# Patient Record
Sex: Female | Born: 1955 | Race: Black or African American | Hispanic: No | Marital: Single | Smoking: Never smoker
Health system: Southern US, Community
[De-identification: ages and names within clinical notes are randomized; demographics above are authoritative.]

## PROBLEM LIST (undated history)

## (undated) DIAGNOSIS — F419 Anxiety disorder, unspecified: Secondary | ICD-10-CM

## (undated) DIAGNOSIS — G4733 Obstructive sleep apnea (adult) (pediatric): Secondary | ICD-10-CM

## (undated) DIAGNOSIS — I2510 Atherosclerotic heart disease of native coronary artery without angina pectoris: Secondary | ICD-10-CM

## (undated) DIAGNOSIS — R519 Headache, unspecified: Secondary | ICD-10-CM

## (undated) DIAGNOSIS — F32A Depression, unspecified: Secondary | ICD-10-CM

## (undated) DIAGNOSIS — F331 Major depressive disorder, recurrent, moderate: Secondary | ICD-10-CM

## (undated) DIAGNOSIS — I10 Essential (primary) hypertension: Secondary | ICD-10-CM

## (undated) DIAGNOSIS — R51 Headache: Secondary | ICD-10-CM

## (undated) DIAGNOSIS — F329 Major depressive disorder, single episode, unspecified: Secondary | ICD-10-CM

## (undated) DIAGNOSIS — M1990 Unspecified osteoarthritis, unspecified site: Secondary | ICD-10-CM

## (undated) DIAGNOSIS — K219 Gastro-esophageal reflux disease without esophagitis: Secondary | ICD-10-CM

## (undated) DIAGNOSIS — Z83719 Family history of colon polyps, unspecified: Secondary | ICD-10-CM

## (undated) DIAGNOSIS — Z8371 Family history of colonic polyps: Secondary | ICD-10-CM

## (undated) DIAGNOSIS — J45909 Unspecified asthma, uncomplicated: Secondary | ICD-10-CM

## (undated) DIAGNOSIS — D649 Anemia, unspecified: Secondary | ICD-10-CM

## (undated) HISTORY — DX: Unspecified osteoarthritis, unspecified site: M19.90

## (undated) HISTORY — DX: Headache, unspecified: R51.9

## (undated) HISTORY — DX: Anemia, unspecified: D64.9

## (undated) HISTORY — DX: Headache: R51

## (undated) HISTORY — DX: Major depressive disorder, single episode, unspecified: F32.9

## (undated) HISTORY — DX: Major depressive disorder, recurrent, moderate: F33.1

## (undated) HISTORY — DX: Gastro-esophageal reflux disease without esophagitis: K21.9

## (undated) HISTORY — DX: Obstructive sleep apnea (adult) (pediatric): G47.33

## (undated) HISTORY — PX: TUBAL LIGATION: SHX77

## (undated) HISTORY — DX: Essential (primary) hypertension: I10

## (undated) HISTORY — PX: CARDIAC CATHETERIZATION: SHX172

## (undated) HISTORY — DX: Unspecified asthma, uncomplicated: J45.909

## (undated) HISTORY — DX: Atherosclerotic heart disease of native coronary artery without angina pectoris: I25.10

## (undated) HISTORY — DX: Anxiety disorder, unspecified: F41.9

## (undated) HISTORY — DX: Depression, unspecified: F32.A

---

## 2004-10-21 ENCOUNTER — Ambulatory Visit: Payer: Self-pay

## 2005-07-26 ENCOUNTER — Emergency Department: Payer: Self-pay | Admitting: Internal Medicine

## 2005-11-22 ENCOUNTER — Emergency Department: Payer: Self-pay | Admitting: Emergency Medicine

## 2006-02-02 ENCOUNTER — Ambulatory Visit: Payer: Self-pay | Admitting: Gastroenterology

## 2006-05-05 ENCOUNTER — Ambulatory Visit: Payer: Self-pay

## 2006-09-07 ENCOUNTER — Ambulatory Visit: Payer: Self-pay

## 2006-10-25 ENCOUNTER — Other Ambulatory Visit: Payer: Self-pay

## 2006-10-26 ENCOUNTER — Ambulatory Visit: Payer: Self-pay

## 2007-06-14 ENCOUNTER — Ambulatory Visit: Payer: Self-pay

## 2007-11-22 ENCOUNTER — Ambulatory Visit: Payer: Self-pay | Admitting: Family Medicine

## 2007-12-02 ENCOUNTER — Ambulatory Visit: Payer: Self-pay | Admitting: Family Medicine

## 2008-05-10 ENCOUNTER — Ambulatory Visit: Payer: Self-pay | Admitting: Family Medicine

## 2008-07-30 ENCOUNTER — Ambulatory Visit: Payer: Self-pay

## 2008-08-09 ENCOUNTER — Ambulatory Visit: Payer: Self-pay

## 2008-09-21 ENCOUNTER — Ambulatory Visit: Payer: Self-pay | Admitting: Podiatry

## 2009-01-02 ENCOUNTER — Ambulatory Visit: Payer: Self-pay | Admitting: Internal Medicine

## 2009-06-15 ENCOUNTER — Inpatient Hospital Stay: Payer: Self-pay | Admitting: Internal Medicine

## 2010-01-14 ENCOUNTER — Ambulatory Visit: Payer: Self-pay | Admitting: Internal Medicine

## 2010-10-07 ENCOUNTER — Ambulatory Visit: Payer: Self-pay | Admitting: Emergency Medicine

## 2010-12-30 ENCOUNTER — Ambulatory Visit: Payer: Self-pay | Admitting: Internal Medicine

## 2011-01-28 ENCOUNTER — Ambulatory Visit: Payer: Self-pay | Admitting: Internal Medicine

## 2011-03-03 ENCOUNTER — Ambulatory Visit: Payer: Self-pay | Admitting: Obstetrics and Gynecology

## 2011-03-06 ENCOUNTER — Ambulatory Visit: Payer: Self-pay | Admitting: Obstetrics and Gynecology

## 2011-07-20 DIAGNOSIS — I119 Hypertensive heart disease without heart failure: Secondary | ICD-10-CM | POA: Insufficient documentation

## 2011-07-20 DIAGNOSIS — K219 Gastro-esophageal reflux disease without esophagitis: Secondary | ICD-10-CM | POA: Insufficient documentation

## 2011-07-20 DIAGNOSIS — J45909 Unspecified asthma, uncomplicated: Secondary | ICD-10-CM | POA: Insufficient documentation

## 2011-08-21 ENCOUNTER — Inpatient Hospital Stay: Payer: Self-pay | Admitting: Psychiatry

## 2011-08-25 ENCOUNTER — Inpatient Hospital Stay: Payer: Self-pay | Admitting: Internal Medicine

## 2011-09-03 ENCOUNTER — Emergency Department: Payer: Self-pay | Admitting: Emergency Medicine

## 2011-10-20 ENCOUNTER — Ambulatory Visit: Payer: Self-pay | Admitting: Internal Medicine

## 2011-11-08 ENCOUNTER — Emergency Department: Payer: Self-pay | Admitting: Emergency Medicine

## 2012-03-09 ENCOUNTER — Ambulatory Visit: Payer: Self-pay | Admitting: Internal Medicine

## 2012-09-06 ENCOUNTER — Emergency Department: Payer: Self-pay | Admitting: Emergency Medicine

## 2012-09-06 LAB — URINALYSIS, COMPLETE
Bilirubin,UR: NEGATIVE
Blood: NEGATIVE
Glucose,UR: NEGATIVE mg/dL (ref 0–75)
Ketone: NEGATIVE
Nitrite: NEGATIVE
RBC,UR: 3 /HPF (ref 0–5)
Specific Gravity: 1.016 (ref 1.003–1.030)
Squamous Epithelial: 5

## 2012-09-06 LAB — COMPREHENSIVE METABOLIC PANEL
Albumin: 4 g/dL (ref 3.4–5.0)
Alkaline Phosphatase: 54 U/L (ref 50–136)
BUN: 8 mg/dL (ref 7–18)
Chloride: 107 mmol/L (ref 98–107)
Co2: 27 mmol/L (ref 21–32)
Creatinine: 1.05 mg/dL (ref 0.60–1.30)
EGFR (African American): >60
EGFR (Non-African Amer.): 59 — ABNORMAL LOW
Glucose: 112 mg/dL — ABNORMAL HIGH (ref 65–99)
Osmolality: 284 (ref 275–301)
SGOT(AST): 16 U/L (ref 15–37)
SGPT (ALT): 16 U/L (ref 12–78)
Sodium: 143 mmol/L (ref 136–145)

## 2012-09-06 LAB — CBC
HGB: 13.1 g/dL (ref 12.0–16.0)
MCV: 70 fL — ABNORMAL LOW (ref 80–100)
Platelet: 255 10*3/uL (ref 150–440)
RBC: 5.65 10*6/uL — ABNORMAL HIGH (ref 3.80–5.20)
WBC: 4.5 10*3/uL (ref 3.6–11.0)

## 2012-09-06 LAB — CK TOTAL AND CKMB (NOT AT ARMC): CK, Total: 78 U/L (ref 21–215)

## 2012-09-06 LAB — TROPONIN I: Troponin-I: <0.02 ng/mL

## 2013-01-28 IMAGING — CR DG CHEST 2V
1 series · 2 of 2 positions shown · non-contrast
Comparison: none

REASON FOR EXAM: chest pain
COMMENTS:

[Series 1: w chest pa · 0.14mm/px · 2 of 2 slices shown]
[im 1/2]
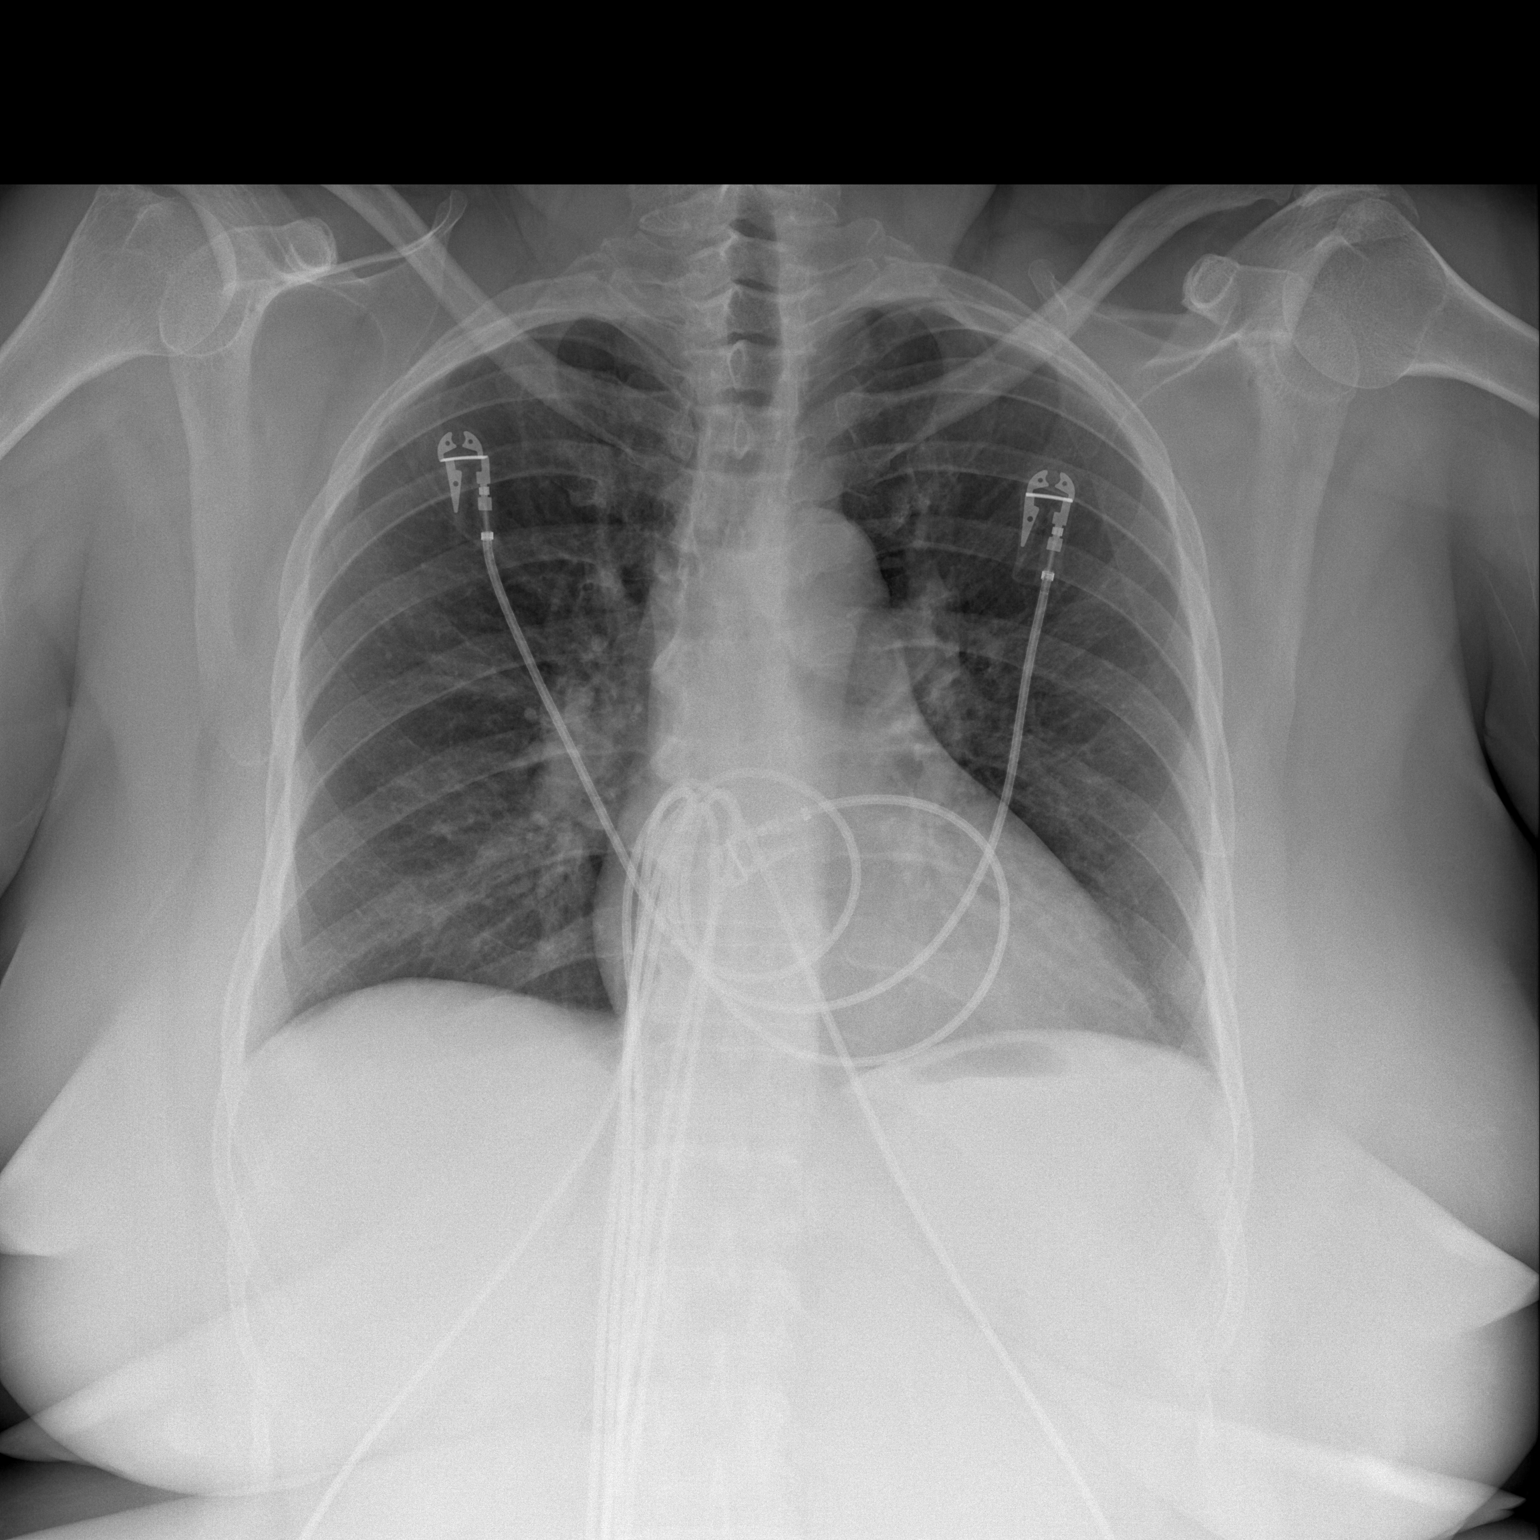
[im 2/2]
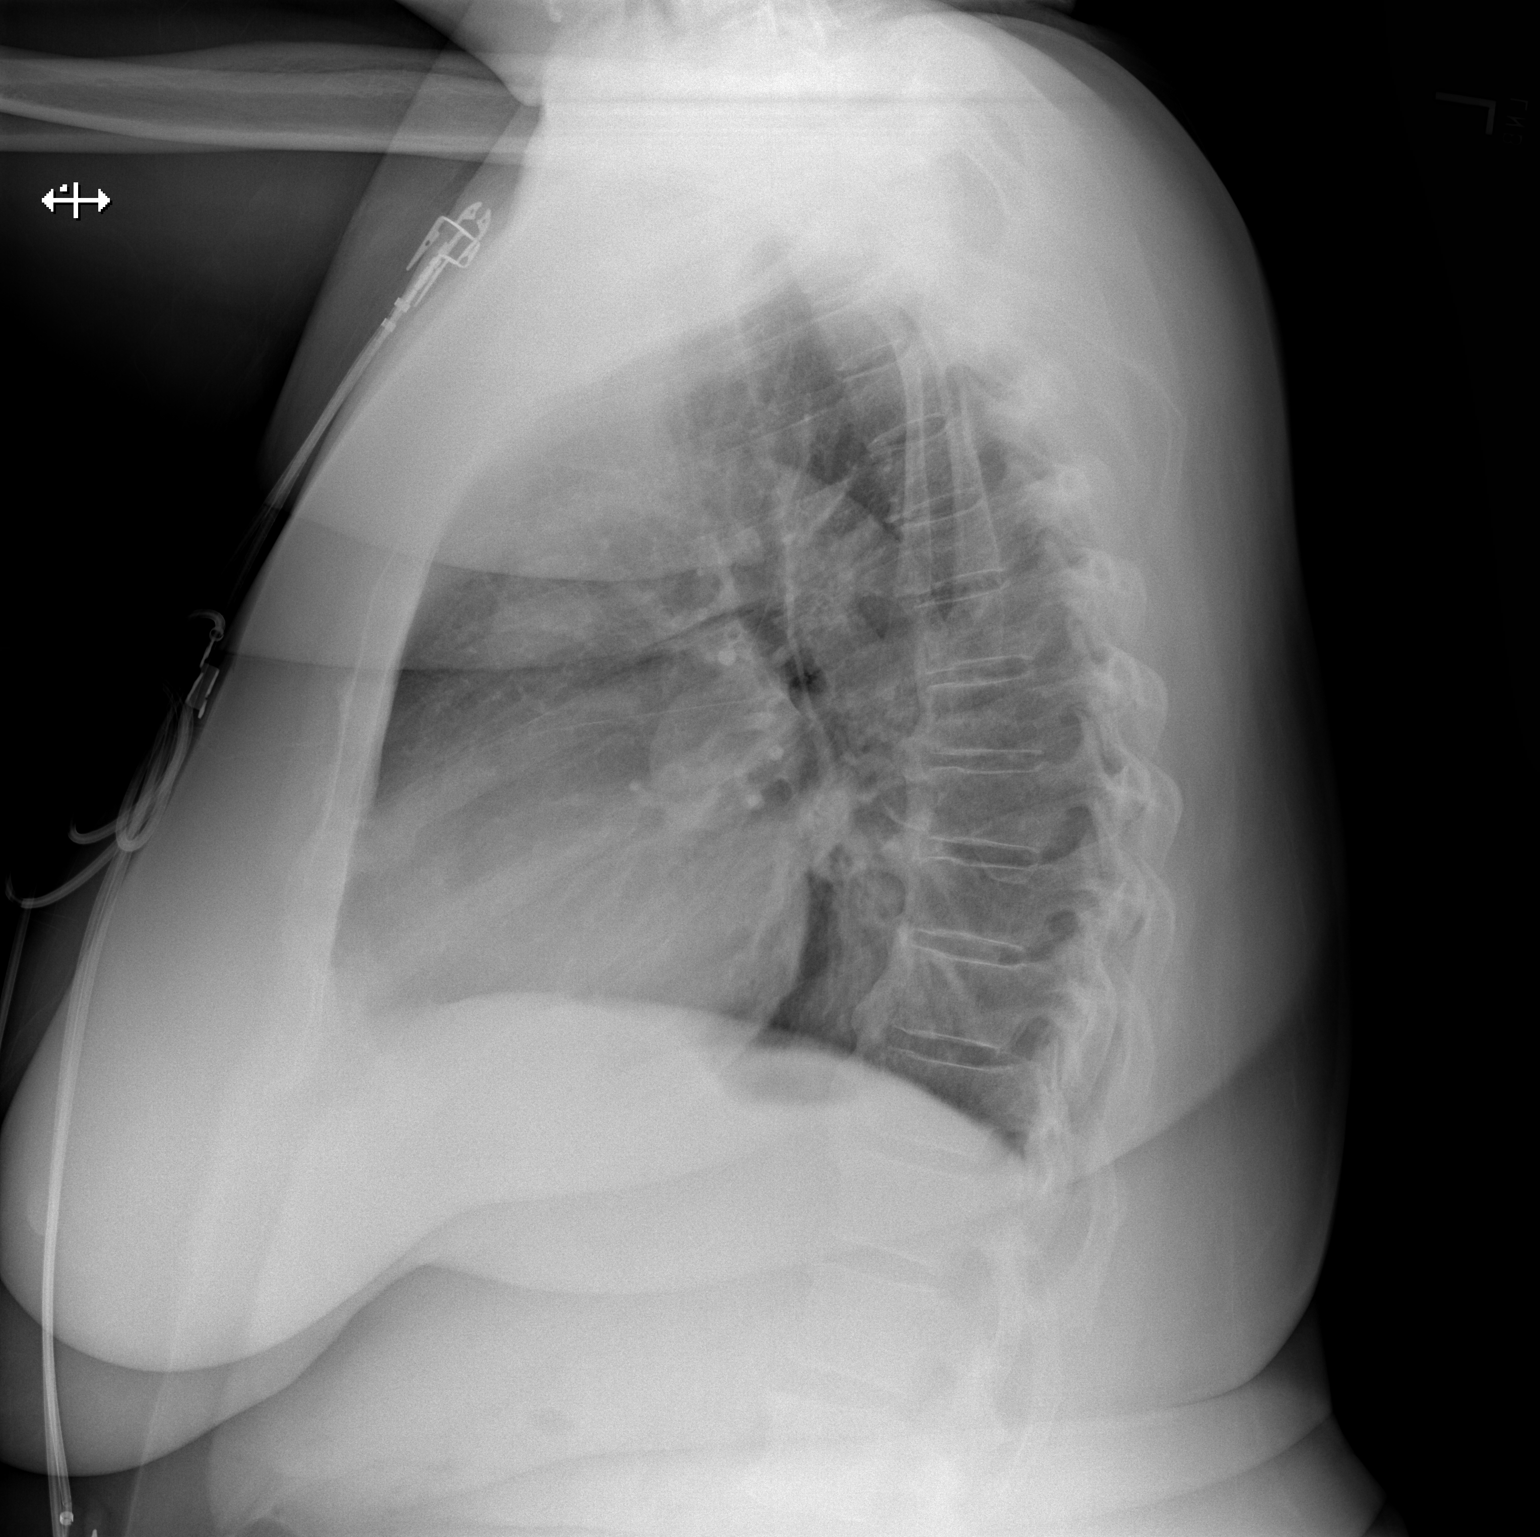

[2 of 2 positions shown; findings below may reference images not displayed]

PROCEDURE:     DXR - DXR CHEST PA (OR AP) AND LATERAL  - August 26, 2011  [DATE]

RESULT:     Comparison is made to a prior exam of 06/15/2009. The lung fields
are clear. No pneumonia, pneumothorax or pleural effusion is seen. Heart is
upper limits for normal in size and is stable in appearance as compared to
the prior exam 06/15/2009. Monitoring electrodes are present.
IMPRESSION: No acute changes are identified.

## 2013-03-10 ENCOUNTER — Ambulatory Visit: Payer: Self-pay | Admitting: Internal Medicine

## 2013-05-09 ENCOUNTER — Emergency Department: Payer: Self-pay | Admitting: Emergency Medicine

## 2013-05-09 LAB — COMPREHENSIVE METABOLIC PANEL
Albumin: 3.8 g/dL (ref 3.4–5.0)
Alkaline Phosphatase: 72 U/L (ref 50–136)
Anion Gap: 6 — ABNORMAL LOW (ref 7–16)
BUN: 12 mg/dL (ref 7–18)
Calcium, Total: 8.9 mg/dL (ref 8.5–10.1)
Co2: 28 mmol/L (ref 21–32)
EGFR (African American): >60
EGFR (Non-African Amer.): >60
Glucose: 83 mg/dL (ref 65–99)
Osmolality: 280 (ref 275–301)
SGOT(AST): 17 U/L (ref 15–37)
SGPT (ALT): 20 U/L (ref 12–78)
Sodium: 141 mmol/L (ref 136–145)
Total Protein: 6.9 g/dL (ref 6.4–8.2)

## 2013-05-09 LAB — URINALYSIS, COMPLETE
Bilirubin,UR: NEGATIVE
Blood: NEGATIVE
Ph: 5 (ref 4.5–8.0)
Protein: NEGATIVE
RBC,UR: 1 /HPF (ref 0–5)
Specific Gravity: 1.017 (ref 1.003–1.030)
Squamous Epithelial: 1
WBC UR: 4 /HPF (ref 0–5)

## 2013-05-09 LAB — CBC
HGB: 11.9 g/dL — ABNORMAL LOW (ref 12.0–16.0)
MCH: 22.5 pg — ABNORMAL LOW (ref 26.0–34.0)
MCV: 69 fL — ABNORMAL LOW (ref 80–100)
Platelet: 196 10*3/uL (ref 150–440)

## 2013-05-10 LAB — LIPASE, BLOOD: Lipase: 125 U/L (ref 73–393)

## 2013-05-10 LAB — TROPONIN I: Troponin-I: <0.02 ng/mL

## 2013-05-10 LAB — CK TOTAL AND CKMB (NOT AT ARMC): CK, Total: 113 U/L (ref 21–215)

## 2013-05-12 LAB — URINE CULTURE

## 2013-06-26 DIAGNOSIS — M1990 Unspecified osteoarthritis, unspecified site: Secondary | ICD-10-CM | POA: Insufficient documentation

## 2013-06-26 DIAGNOSIS — R0789 Other chest pain: Secondary | ICD-10-CM | POA: Insufficient documentation

## 2013-06-26 DIAGNOSIS — IMO0001 Reserved for inherently not codable concepts without codable children: Secondary | ICD-10-CM | POA: Insufficient documentation

## 2013-08-04 ENCOUNTER — Ambulatory Visit: Payer: 59 | Admitting: Neurology

## 2013-08-15 ENCOUNTER — Telehealth: Payer: Self-pay | Admitting: Diagnostic Neuroimaging

## 2013-08-15 DIAGNOSIS — R0683 Snoring: Secondary | ICD-10-CM

## 2013-08-15 DIAGNOSIS — G4710 Hypersomnia, unspecified: Secondary | ICD-10-CM

## 2013-08-16 NOTE — Telephone Encounter (Signed)
Left message for patient to let me know when she will be in to pick up medicine, since it needs to be refrigerated.

## 2013-08-22 ENCOUNTER — Encounter: Payer: Self-pay | Admitting: Neurology

## 2013-08-23 ENCOUNTER — Encounter: Payer: Self-pay | Admitting: Neurology

## 2013-08-23 ENCOUNTER — Ambulatory Visit (INDEPENDENT_AMBULATORY_CARE_PROVIDER_SITE_OTHER): Payer: Medicare Other | Admitting: Neurology

## 2013-08-23 ENCOUNTER — Encounter (INDEPENDENT_AMBULATORY_CARE_PROVIDER_SITE_OTHER): Payer: Self-pay

## 2013-08-23 VITALS — BP 132/72 | HR 54 | Ht 65.0 in | Wt 266.0 lb

## 2013-08-23 DIAGNOSIS — R5381 Other malaise: Secondary | ICD-10-CM

## 2013-08-23 DIAGNOSIS — G4700 Insomnia, unspecified: Secondary | ICD-10-CM

## 2013-08-23 DIAGNOSIS — R51 Headache: Secondary | ICD-10-CM

## 2013-08-23 DIAGNOSIS — R5383 Other fatigue: Secondary | ICD-10-CM

## 2013-08-23 NOTE — Progress Notes (Signed)
[facility]    Provider:  Dr Hosie Poisson Referring Provider: Hyman Hopes, MD Primary Care Physician:  Hyman Hopes, MD  CC:  headache  HPI:  Carly Marks is a 57 y.o. female here as a referral from Dr. Lawerance Bach for headache evaluation  Has been having headaches for years, occurring almost daily, notes a last all day. Described as a bifrontal dull aching pain can get up to an 8 or 9/10. No nausea or vomiting, does have some mild photophobia. No focal weakness with the headache no visual changes. No dizziness or vertigo. Takes acetaminophen 500 mg daily for the headache, does not take it everyday though. Does not think she has been on any other medications for the headache.  She does note sleep troubles, difficulty staying asleep, waking up frequently. She has been told she snores, unsure she has apnea events. Wakes up feeling tired, no energy throughout the day.  Review of Systems: Out of a complete 14 system review, the patient complains of only the following symptoms, and all other reviewed systems are negative. Positive for fevers chills blurred vision shortness of breath easy bruising increased thirst snoring constipation joint pain aching muscles urinary problems none of sleep decreased energy dizziness headache insomnia sleepiness  History   Social History  . Marital Status: Single    Spouse Name: N/A    Number of Children: 3  . Years of Education: 10 TH   Occupational History  . Not on file.   Social History Main Topics  . Smoking status: Never Smoker   . Smokeless tobacco: Never Used  . Alcohol Use: No  . Drug Use: No  . Sexual Activity: Not on file   Other Topics Concern  . Not on file   Social History Narrative  . No narrative on file    Family History  Problem Relation Age of Onset  . Cervical cancer Mother   . Colon cancer    . Hypertension    . Diabetes Maternal Aunt   . Diabetes Maternal Uncle     Past Medical History    Diagnosis Date  . GERD (gastroesophageal reflux disease)   . Depression   . Anxiety   . Obstructive sleep apnea   . HTN (hypertension)   . Anemia   . HA (headache)   . Arthritis     No past surgical history on file.  Current Outpatient Prescriptions  Medication Sig Dispense Refill  . albuterol (PROVENTIL HFA;VENTOLIN HFA) 108 (90 BASE) MCG/ACT inhaler Inhale 2 puffs into the lungs every 6 (six) hours as needed for wheezing.      Marland Kitchen amLODipine-benazepril (LOTREL) 5-10 MG per capsule Take 5-10 capsules by mouth daily.      Marland Kitchen aspirin 81 MG tablet Take 81 mg by mouth daily.      . cholecalciferol (VITAMIN D) 400 UNITS TABS tablet Take by mouth.      . clonazePAM (KLONOPIN) 0.5 MG tablet Take 0.5 mg by mouth daily.      . famotidine (PEPCID) 20 MG tablet Take 20 mg by mouth daily.      Marland Kitchen FLUoxetine (PROZAC) 20 MG capsule Take 20 mg by mouth daily.      . fluticasone (FLONASE) 50 MCG/ACT nasal spray Place 2 sprays into the nose daily.      . meloxicam (MOBIC) 15 MG tablet Take 15 mg by mouth daily.      Marland Kitchen omeprazole (PRILOSEC) 20 MG capsule Take 20 mg by mouth daily.      Marland Kitchen  oxybutynin (DITROPAN) 5 MG tablet Take 5 mg by mouth daily.      Marland Kitchen PATANOL 0.1 % ophthalmic solution Place 0.1 drops into both eyes daily.      . ranitidine (ZANTAC) 75 MG tablet Take 75 mg by mouth 2 (two) times daily.      . traZODone (DESYREL) 150 MG tablet Take 150 mg by mouth daily.      Marland Kitchen zoster vaccine live, PF, (ZOSTAVAX) 40981 UNT/0.65ML injection Inject 0.65 mLs into the skin once.       No current facility-administered medications for this visit.    Allergies as of 08/23/2013 - Review Complete 08/23/2013  Allergen Reaction Noted  . Codeine  08/22/2013  . Dextroamphetamine  08/22/2013  . Nsaids  08/22/2013    Vitals: BP 132/72  Pulse 54  Ht 5\' 5"  (1.651 m)  Wt 266 lb (120.657 kg)  BMI 44.26 kg/m2 Last Weight:  Wt Readings from Last 1 Encounters:  08/23/13 266 lb (120.657 kg)   Last Height:    Ht Readings from Last 1 Encounters:  08/23/13 5\' 5"  (1.651 m)     Physical exam: Exam: Gen: NAD, conversant Eyes: anicteric sclerae, moist conjunctivae HENT: Atraumatic, oropharynx clear Neck: Trachea midline; supple,  Lungs: CTA, no wheezing, rales, rhonic                          CV: RRR, no MRG Abdomen: Soft, non-tender;  Extremities: No peripheral edema  Skin: Normal temperature, no rash,  Psych: Appropriate affect, pleasant  Neuro: MS: AA&Ox3, appropriately interactive, normal affect   Speech: Limited but fluent w/o paraphasic error  Memory: good recent and remote recall  CN: PERRL, EOMI no nystagmus, no ptosis, sensation intact to LT V1-V3 bilat, face symmetric, no weakness, hearing grossly intact, palate elevates symmetrically, shoulder shrug 5/5 bilat,  tongue protrudes midline, no fasiculations noted.  Motor: normal bulk and tone Strength: 5/5  In all extremities  Coord: rapid alternating and point-to-point (FNF, HTS) movements intact.  Reflexes: symmetrical, bilat downgoing toes  Sens: LT intact in all extremities  Gait: posture, stance, stride and arm-swing normal. Tandem gait intact. Able to walk on heels and toes. Romberg absent.   Assessment:  After physical and neurologic examination, review of laboratory studies, imaging, neurophysiology testing and pre-existing records, assessment will be reviewed on the problem list.  Plan:  Treatment plan and additional workup will be reviewed under Problem List.  1)Headache 2)Insomnia/fatigue  Ms. Barrett is a pleasant 57 year old woman presents for initial evaluation of headache. She reports has been ongoing for years, described as bifrontal dull  aching pain. Physical exam is unremarkable. Headache appears to be tension type in nature, cannot rule out obstructive sleep apnea as cause. Discussed different treatment options with patient, she wishes to hold off on oral medication at this time pending workup for  sleep apnea. Will refer patient to sleep Center for further workup.

## 2013-08-23 NOTE — Patient Instructions (Addendum)
Overall you are doing fairly well but I do want to suggest a few things today:   Remember to drink plenty of fluid, eat healthy meals and do not skip any meals. Try to eat protein with a every meal and eat a healthy snack such as fruit or nuts in between meals. Try to keep a regular sleep-wake schedule and try to exercise daily, particularly in the form of walking, 20-30 minutes a day, if you can.   We will place a referral for you to see our sleep specialist to determine if you are suffering from sleep apnea. This can be the cause of your headaches.   In the future we can consider a medication called Gabapentin.   I would like to see you back once you have been evaluated by the sleep clinic, sooner if we need to. Please call us with any interim questions, concerns, problems, updates or refill requests.   My clinical assistant and will answer any of your questions and relay your messages to me and also relay most of my messages to you.   Our phone number is [phone]. We also have an after hours call service for urgent matters and there is a physician on-call for urgent questions. For any emergencies you know to call 911 or go to the nearest emergency room

## 2013-08-31 ENCOUNTER — Telehealth: Payer: Self-pay | Admitting: Neurology

## 2013-08-31 NOTE — Telephone Encounter (Signed)
Patient referred by dr Hosie Poisson for sleep study, should be undergoing SPLIT at [facility] 20 and score 45 , [employer], medic.  CO2 needed.

## 2013-08-31 NOTE — Telephone Encounter (Signed)
. °  Dr. Elspeth Cho is referring Carly Marks, 57 y.o. female, for the evaluation of sleep apnea.  Wt: 266 lbs. Ht: 65 in. BMI: 44.26  Diagnoses: Morbid Obesity Snoring Excessive Daytime Sleepiness Insomnia Fatigue Headache HTN Frequent Awakenings  Medication List: Current Outpatient Prescriptions  Medication Sig Dispense Refill   albuterol (PROVENTIL HFA;VENTOLIN HFA) 108 (90 BASE) MCG/ACT inhaler Inhale 2 puffs into the lungs every 6 (six) hours as needed for wheezing.       amLODipine-benazepril (LOTREL) 5-10 MG per capsule Take 5-10 capsules by mouth daily.       aspirin 81 MG tablet Take 81 mg by mouth daily.       cholecalciferol (VITAMIN D) 400 UNITS TABS tablet Take by mouth.       clonazePAM (KLONOPIN) 0.5 MG tablet Take 0.5 mg by mouth daily.       famotidine (PEPCID) 20 MG tablet Take 20 mg by mouth daily.       FLUoxetine (PROZAC) 20 MG capsule Take 20 mg by mouth daily.       fluticasone (FLONASE) 50 MCG/ACT nasal spray Place 2 sprays into the nose daily.       meloxicam (MOBIC) 15 MG tablet Take 15 mg by mouth daily.       omeprazole (PRILOSEC) 20 MG capsule Take 20 mg by mouth daily.       oxybutynin (DITROPAN) 5 MG tablet Take 5 mg by mouth daily.       PATANOL 0.1 % ophthalmic solution Place 0.1 drops into both eyes daily.       ranitidine (ZANTAC) 75 MG tablet Take 75 mg by mouth 2 (two) times daily.       traZODone (DESYREL) 150 MG tablet Take 150 mg by mouth daily.       zoster vaccine live, PF, (ZOSTAVAX) 16109 UNT/0.65ML injection Inject 0.65 mLs into the skin once.       No current facility-administered medications for this visit.    This patient presents to Dr. Elspeth Cho for a headache evaluation.  Pt is morbidly obese and has ongoing hx of daily headache for years.  She does note some sleep troubles, has difficulty staying asleep and awakens frequently.  She has been told that she snores but she is unaware of apneic events.  She is  fatigued upon awakening and has no energy throughout the day.  Dr. Hosie Poisson is requesting an attended sleep study to rule out osa.  Insurance:  [facility] MEDICARE/MEDICAID - Prior approval is not required

## 2013-09-18 NOTE — Telephone Encounter (Signed)
Patient came in to see Dr. Hosie Poisson on 08-23-13.  I don't see that the patient is on Copaxone, I will return sample to stock.

## 2014-01-18 LAB — COMPREHENSIVE METABOLIC PANEL
ALBUMIN: 4 g/dL (ref 3.4–5.0)
ANION GAP: 4 — AB (ref 7–16)
Alkaline Phosphatase: 61 U/L
BUN: 11 mg/dL (ref 7–18)
Bilirubin,Total: 1.1 mg/dL — ABNORMAL HIGH (ref 0.2–1.0)
CALCIUM: 9.1 mg/dL (ref 8.5–10.1)
CO2: 27 mmol/L (ref 21–32)
CREATININE: 0.97 mg/dL (ref 0.60–1.30)
Chloride: 107 mmol/L (ref 98–107)
EGFR (Non-African Amer.): >60
Glucose: 96 mg/dL (ref 65–99)
OSMOLALITY: 275 (ref 275–301)
Potassium: 4 mmol/L (ref 3.5–5.1)
SGOT(AST): 15 U/L (ref 15–37)
SGPT (ALT): 18 U/L (ref 12–78)
SODIUM: 138 mmol/L (ref 136–145)
TOTAL PROTEIN: 7.4 g/dL (ref 6.4–8.2)

## 2014-01-18 LAB — DRUG SCREEN, URINE
AMPHETAMINES, UR SCREEN: NEGATIVE (ref ?–1000)
BENZODIAZEPINE, UR SCRN: NEGATIVE (ref ?–200)
Barbiturates, Ur Screen: NEGATIVE (ref ?–200)
COCAINE METABOLITE, UR ~~LOC~~: NEGATIVE (ref ?–300)
Cannabinoid 50 Ng, Ur ~~LOC~~: NEGATIVE (ref ?–50)
MDMA (Ecstasy)Ur Screen: NEGATIVE (ref ?–500)
METHADONE, UR SCREEN: NEGATIVE (ref ?–300)
Opiate, Ur Screen: NEGATIVE (ref ?–300)
PHENCYCLIDINE (PCP) UR S: NEGATIVE (ref ?–25)
TRICYCLIC, UR SCREEN: NEGATIVE (ref ?–1000)

## 2014-01-18 LAB — CBC
HCT: 37.8 % (ref 35.0–47.0)
HGB: 12.2 g/dL (ref 12.0–16.0)
MCH: 22.7 pg — ABNORMAL LOW (ref 26.0–34.0)
MCHC: 32.3 g/dL (ref 32.0–36.0)
MCV: 70 fL — AB (ref 80–100)
Platelet: 243 10*3/uL (ref 150–440)
RBC: 5.39 10*6/uL — AB (ref 3.80–5.20)
RDW: 14.8 % — AB (ref 11.5–14.5)
WBC: 4.7 10*3/uL (ref 3.6–11.0)

## 2014-01-18 LAB — URINALYSIS, COMPLETE
Bilirubin,UR: NEGATIVE
Glucose,UR: NEGATIVE mg/dL (ref 0–75)
Ketone: NEGATIVE
NITRITE: NEGATIVE
Ph: 7 (ref 4.5–8.0)
Protein: NEGATIVE
RBC,UR: 4 /HPF (ref 0–5)
Specific Gravity: 1.013 (ref 1.003–1.030)
WBC UR: 3 /HPF (ref 0–5)

## 2014-01-18 LAB — SALICYLATE LEVEL

## 2014-01-18 LAB — ACETAMINOPHEN LEVEL

## 2014-01-18 LAB — ETHANOL
Ethanol %: <0.003 % (ref 0.000–0.080)
Ethanol: <3 mg/dL

## 2014-01-19 ENCOUNTER — Inpatient Hospital Stay: Payer: Self-pay | Admitting: Psychiatry

## 2014-01-30 LAB — BASIC METABOLIC PANEL
ANION GAP: 1 — AB (ref 7–16)
BUN: 31 mg/dL — AB (ref 7–18)
CO2: 29 mmol/L (ref 21–32)
Calcium, Total: 9 mg/dL (ref 8.5–10.1)
Chloride: 102 mmol/L (ref 98–107)
Creatinine: 2.26 mg/dL — ABNORMAL HIGH (ref 0.60–1.30)
EGFR (African American): 27 — ABNORMAL LOW
EGFR (Non-African Amer.): 23 — ABNORMAL LOW
Glucose: 92 mg/dL (ref 65–99)
Osmolality: 271 (ref 275–301)
POTASSIUM: 4.8 mmol/L (ref 3.5–5.1)
SODIUM: 132 mmol/L — AB (ref 136–145)

## 2014-01-30 LAB — LITHIUM LEVEL
LITHIUM: 2.16 mmol/L — AB
LITHIUM: 2.04 mmol/L — AB

## 2014-01-31 ENCOUNTER — Inpatient Hospital Stay: Payer: Self-pay | Admitting: Internal Medicine

## 2014-01-31 LAB — CBC WITH DIFFERENTIAL/PLATELET
Basophil #: 0 10*3/uL (ref 0.0–0.1)
Basophil %: 0.6 %
Eosinophil #: 0.2 10*3/uL (ref 0.0–0.7)
Eosinophil %: 3.4 %
HCT: 37.6 % (ref 35.0–47.0)
HGB: 11.9 g/dL — ABNORMAL LOW (ref 12.0–16.0)
LYMPHS ABS: 1.2 10*3/uL (ref 1.0–3.6)
LYMPHS PCT: 17.6 %
MCH: 22.6 pg — ABNORMAL LOW (ref 26.0–34.0)
MCHC: 31.7 g/dL — AB (ref 32.0–36.0)
MCV: 71 fL — AB (ref 80–100)
MONOS PCT: 7.2 %
Monocyte #: 0.5 x10 3/mm (ref 0.2–0.9)
NEUTROS ABS: 4.7 10*3/uL (ref 1.4–6.5)
NEUTROS PCT: 71.2 %
Platelet: 160 10*3/uL (ref 150–440)
RBC: 5.28 10*6/uL — ABNORMAL HIGH (ref 3.80–5.20)
RDW: 14.8 % — AB (ref 11.5–14.5)
WBC: 6.6 10*3/uL (ref 3.6–11.0)

## 2014-01-31 LAB — URINALYSIS, COMPLETE
BACTERIA: NONE SEEN
BILIRUBIN, UR: NEGATIVE
BLOOD: NEGATIVE
Glucose,UR: NEGATIVE mg/dL (ref 0–75)
KETONE: NEGATIVE
Leukocyte Esterase: NEGATIVE
NITRITE: NEGATIVE
Ph: 6 (ref 4.5–8.0)
Protein: NEGATIVE
RBC,UR: NONE SEEN /HPF (ref 0–5)
Specific Gravity: 1.004 (ref 1.003–1.030)
WBC UR: <1 /HPF (ref 0–5)

## 2014-01-31 LAB — BASIC METABOLIC PANEL
ANION GAP: 5 — AB (ref 7–16)
Anion Gap: 4 — ABNORMAL LOW (ref 7–16)
BUN: 29 mg/dL — ABNORMAL HIGH (ref 7–18)
BUN: 26 mg/dL — ABNORMAL HIGH (ref 7–18)
CALCIUM: 8.9 mg/dL (ref 8.5–10.1)
CALCIUM: 9.1 mg/dL (ref 8.5–10.1)
CHLORIDE: 104 mmol/L (ref 98–107)
CO2: 25 mmol/L (ref 21–32)
CREATININE: 1.74 mg/dL — AB (ref 0.60–1.30)
Chloride: 99 mmol/L (ref 98–107)
Co2: 27 mmol/L (ref 21–32)
Creatinine: 2.04 mg/dL — ABNORMAL HIGH (ref 0.60–1.30)
EGFR (African American): 31 — ABNORMAL LOW
EGFR (Non-African Amer.): 26 — ABNORMAL LOW
GFR CALC AF AMER: 37 — AB
GFR CALC NON AF AMER: 32 — AB
GLUCOSE: 82 mg/dL (ref 65–99)
Glucose: 85 mg/dL (ref 65–99)
Osmolality: 268 (ref 275–301)
Osmolality: 270 (ref 275–301)
Potassium: 4.2 mmol/L (ref 3.5–5.1)
Potassium: 4.7 mmol/L (ref 3.5–5.1)
Sodium: 131 mmol/L — ABNORMAL LOW (ref 136–145)
Sodium: 133 mmol/L — ABNORMAL LOW (ref 136–145)

## 2014-01-31 LAB — IRON AND TIBC
IRON BIND. CAP.(TOTAL): 228 ug/dL — AB (ref 250–450)
Iron Saturation: 27 %
Iron: 61 ug/dL (ref 50–170)
Unbound Iron-Bind.Cap.: 167 ug/dL

## 2014-01-31 LAB — TSH: THYROID STIMULATING HORM: 3.46 u[IU]/mL

## 2014-01-31 LAB — LITHIUM LEVEL: Lithium: 1.89 mmol/L

## 2014-02-01 LAB — BASIC METABOLIC PANEL
Anion Gap: 4 — ABNORMAL LOW (ref 7–16)
BUN: 17 mg/dL (ref 7–18)
CREATININE: 1.45 mg/dL — AB (ref 0.60–1.30)
Calcium, Total: 8.4 mg/dL — ABNORMAL LOW (ref 8.5–10.1)
Chloride: 108 mmol/L — ABNORMAL HIGH (ref 98–107)
Co2: 24 mmol/L (ref 21–32)
EGFR (African American): 46 — ABNORMAL LOW
EGFR (Non-African Amer.): 40 — ABNORMAL LOW
GLUCOSE: 99 mg/dL (ref 65–99)
Osmolality: 274 (ref 275–301)
Potassium: 4.4 mmol/L (ref 3.5–5.1)
SODIUM: 136 mmol/L (ref 136–145)

## 2014-02-01 LAB — CBC WITH DIFFERENTIAL/PLATELET
Basophil #: 0.1 10*3/uL (ref 0.0–0.1)
Basophil %: 1.3 %
Eosinophil #: 0.4 10*3/uL (ref 0.0–0.7)
Eosinophil %: 5.8 %
HCT: 34.5 % — ABNORMAL LOW (ref 35.0–47.0)
HGB: 11 g/dL — ABNORMAL LOW (ref 12.0–16.0)
LYMPHS ABS: 1.5 10*3/uL (ref 1.0–3.6)
LYMPHS PCT: 22.8 %
MCH: 22.7 pg — AB (ref 26.0–34.0)
MCHC: 31.8 g/dL — ABNORMAL LOW (ref 32.0–36.0)
MCV: 71 fL — AB (ref 80–100)
MONO ABS: 0.7 x10 3/mm (ref 0.2–0.9)
MONOS PCT: 10.3 %
Neutrophil #: 3.9 10*3/uL (ref 1.4–6.5)
Neutrophil %: 59.8 %
PLATELETS: 176 10*3/uL (ref 150–440)
RBC: 4.83 10*6/uL (ref 3.80–5.20)
RDW: 14.6 % — AB (ref 11.5–14.5)
WBC: 6.6 10*3/uL (ref 3.6–11.0)

## 2014-02-01 LAB — LITHIUM LEVEL: Lithium: 1.55 mmol/L

## 2014-02-02 ENCOUNTER — Inpatient Hospital Stay: Payer: Self-pay | Admitting: Psychiatry

## 2014-02-02 LAB — BASIC METABOLIC PANEL
Anion Gap: 5 — ABNORMAL LOW (ref 7–16)
BUN: 10 mg/dL (ref 7–18)
CHLORIDE: 107 mmol/L (ref 98–107)
Calcium, Total: 8.8 mg/dL (ref 8.5–10.1)
Co2: 25 mmol/L (ref 21–32)
Creatinine: 1.28 mg/dL (ref 0.60–1.30)
EGFR (African American): 53 — ABNORMAL LOW
EGFR (Non-African Amer.): 46 — ABNORMAL LOW
GLUCOSE: 94 mg/dL (ref 65–99)
OSMOLALITY: 273 (ref 275–301)
POTASSIUM: 3.9 mmol/L (ref 3.5–5.1)
Sodium: 137 mmol/L (ref 136–145)

## 2014-02-02 LAB — LITHIUM LEVEL: Lithium: 1.16 mmol/L

## 2014-02-03 LAB — URINALYSIS, COMPLETE
BILIRUBIN, UR: NEGATIVE
Bacteria: NONE SEEN
Glucose,UR: NEGATIVE mg/dL (ref 0–75)
Ketone: NEGATIVE
NITRITE: NEGATIVE
Ph: 6 (ref 4.5–8.0)
Protein: NEGATIVE
RBC,UR: 13 /HPF (ref 0–5)
Specific Gravity: 1.006 (ref 1.003–1.030)

## 2014-02-03 LAB — BASIC METABOLIC PANEL
Anion Gap: 3 — ABNORMAL LOW (ref 7–16)
BUN: 8 mg/dL (ref 7–18)
CO2: 27 mmol/L (ref 21–32)
Calcium, Total: 8.9 mg/dL (ref 8.5–10.1)
Chloride: 106 mmol/L (ref 98–107)
Creatinine: 1.2 mg/dL (ref 0.60–1.30)
EGFR (African American): 58 — ABNORMAL LOW
GFR CALC NON AF AMER: 50 — AB
GLUCOSE: 86 mg/dL (ref 65–99)
Osmolality: 270 (ref 275–301)
POTASSIUM: 4.1 mmol/L (ref 3.5–5.1)
SODIUM: 136 mmol/L (ref 136–145)

## 2014-02-03 LAB — LITHIUM LEVEL: LITHIUM: 0.79 mmol/L

## 2014-02-05 LAB — BASIC METABOLIC PANEL
Anion Gap: 3 — ABNORMAL LOW (ref 7–16)
BUN: 9 mg/dL (ref 7–18)
CO2: 27 mmol/L (ref 21–32)
Calcium, Total: 9 mg/dL (ref 8.5–10.1)
Chloride: 107 mmol/L (ref 98–107)
Creatinine: 1.26 mg/dL (ref 0.60–1.30)
EGFR (Non-African Amer.): 47 — ABNORMAL LOW
GFR CALC AF AMER: 54 — AB
Glucose: 104 mg/dL — ABNORMAL HIGH (ref 65–99)
Osmolality: 273 (ref 275–301)
POTASSIUM: 3.9 mmol/L (ref 3.5–5.1)
SODIUM: 137 mmol/L (ref 136–145)

## 2014-02-05 LAB — LITHIUM LEVEL: Lithium: 0.61 mmol/L

## 2014-02-06 LAB — LITHIUM LEVEL: LITHIUM: 0.55 mmol/L — AB

## 2014-02-06 LAB — CREATININE, SERUM
CREATININE: 1.24 mg/dL (ref 0.60–1.30)
EGFR (Non-African Amer.): 48 — ABNORMAL LOW
GFR CALC AF AMER: 55 — AB

## 2014-02-07 LAB — CREATININE, SERUM
Creatinine: 1.27 mg/dL (ref 0.60–1.30)
EGFR (African American): 54 — ABNORMAL LOW
EGFR (Non-African Amer.): 46 — ABNORMAL LOW

## 2014-02-07 LAB — LITHIUM LEVEL: Lithium: 0.41 mmol/L — ABNORMAL LOW

## 2014-03-12 ENCOUNTER — Ambulatory Visit: Payer: Self-pay | Admitting: Internal Medicine

## 2014-10-24 ENCOUNTER — Emergency Department: Payer: Self-pay | Admitting: Emergency Medicine

## 2014-10-24 LAB — COMPREHENSIVE METABOLIC PANEL
ALT: 18 U/L
ANION GAP: 4 — AB (ref 7–16)
Albumin: 3.8 g/dL (ref 3.4–5.0)
Alkaline Phosphatase: 60 U/L
BILIRUBIN TOTAL: 1.3 mg/dL — AB (ref 0.2–1.0)
BUN: 9 mg/dL (ref 7–18)
CALCIUM: 9.2 mg/dL (ref 8.5–10.1)
Chloride: 105 mmol/L (ref 98–107)
Co2: 30 mmol/L (ref 21–32)
Creatinine: 1.24 mg/dL (ref 0.60–1.30)
EGFR (African American): 57 — ABNORMAL LOW
EGFR (Non-African Amer.): 47 — ABNORMAL LOW
GLUCOSE: 86 mg/dL (ref 65–99)
Osmolality: 276 (ref 275–301)
POTASSIUM: 3.9 mmol/L (ref 3.5–5.1)
SGOT(AST): 11 U/L — ABNORMAL LOW (ref 15–37)
Sodium: 139 mmol/L (ref 136–145)
TOTAL PROTEIN: 7.3 g/dL (ref 6.4–8.2)

## 2014-10-24 LAB — URINALYSIS, COMPLETE
Bilirubin,UR: NEGATIVE
Glucose,UR: NEGATIVE mg/dL (ref 0–75)
Ketone: NEGATIVE
Nitrite: NEGATIVE
PH: 7 (ref 4.5–8.0)
Protein: NEGATIVE
RBC,UR: 4 /HPF (ref 0–5)
Specific Gravity: 1.014 (ref 1.003–1.030)
Transitional Epi: 2

## 2014-10-24 LAB — TROPONIN I

## 2014-10-24 LAB — CBC WITH DIFFERENTIAL/PLATELET
Basophil #: 0 10*3/uL (ref 0.0–0.1)
Basophil %: 0.7 %
EOS PCT: 2.5 %
Eosinophil #: 0.1 10*3/uL (ref 0.0–0.7)
HCT: 39.2 % (ref 35.0–47.0)
HGB: 12.4 g/dL (ref 12.0–16.0)
LYMPHS PCT: 27.9 %
Lymphocyte #: 1.4 10*3/uL (ref 1.0–3.6)
MCH: 22.8 pg — ABNORMAL LOW (ref 26.0–34.0)
MCHC: 31.8 g/dL — AB (ref 32.0–36.0)
MCV: 72 fL — ABNORMAL LOW (ref 80–100)
Monocyte #: 0.3 x10 3/mm (ref 0.2–0.9)
Monocyte %: 6.3 %
Neutrophil #: 3 10*3/uL (ref 1.4–6.5)
Neutrophil %: 62.6 %
Platelet: 232 10*3/uL (ref 150–440)
RBC: 5.46 10*6/uL — AB (ref 3.80–5.20)
RDW: 14.9 % — ABNORMAL HIGH (ref 11.5–14.5)
WBC: 4.8 10*3/uL (ref 3.6–11.0)

## 2015-02-04 ENCOUNTER — Emergency Department: Payer: Self-pay | Admitting: Emergency Medicine

## 2015-02-04 LAB — PRO B NATRIURETIC PEPTIDE: B-Type Natriuretic Peptide: 74 pg/mL

## 2015-02-04 LAB — TROPONIN I
TROPONIN-I: 0.16 ng/mL — AB
Troponin-I: 0.03 ng/mL
Troponin-I: 0.17 ng/mL — ABNORMAL HIGH

## 2015-02-04 LAB — CBC
HCT: 40.2 % (ref 35.0–47.0)
HGB: 12.4 g/dL (ref 12.0–16.0)
MCH: 21.9 pg — ABNORMAL LOW (ref 26.0–34.0)
MCHC: 30.9 g/dL — ABNORMAL LOW (ref 32.0–36.0)
MCV: 71 fL — ABNORMAL LOW (ref 80–100)
PLATELETS: 234 10*3/uL (ref 150–440)
RBC: 5.69 10*6/uL — AB (ref 3.80–5.20)
RDW: 14.9 % — ABNORMAL HIGH (ref 11.5–14.5)
WBC: 6.1 10*3/uL (ref 3.6–11.0)

## 2015-02-04 LAB — BASIC METABOLIC PANEL
Anion Gap: 7 (ref 7–16)
BUN: 19 mg/dL
CREATININE: 1.13 mg/dL — AB
Calcium, Total: 9.7 mg/dL
Chloride: 106 mmol/L
Co2: 28 mmol/L
EGFR (Non-African Amer.): 53 — ABNORMAL LOW
GLUCOSE: 108 mg/dL — AB
POTASSIUM: 3.7 mmol/L
Sodium: 141 mmol/L

## 2015-02-04 LAB — PROTIME-INR
INR: 1.1
Prothrombin Time: 14.4 secs

## 2015-02-04 LAB — APTT: Activated PTT: 25.5 secs (ref 23.6–35.9)

## 2015-02-04 LAB — LIPID PANEL
CHOLESTEROL: 171 mg/dL
HDL: 63 mg/dL
Ldl Cholesterol, Calc: 100 mg/dL — ABNORMAL HIGH
TRIGLYCERIDES: 41 mg/dL
VLDL CHOLESTEROL, CALC: 8 mg/dL

## 2015-02-04 LAB — MAGNESIUM: MAGNESIUM: 1.8 mg/dL

## 2015-02-04 LAB — HEPARIN LEVEL (UNFRACTIONATED): ANTI-XA(UNFRACTIONATED): 0.43 [IU]/mL (ref 0.30–0.70)

## 2015-02-04 LAB — CK-MB: CK-MB: 2 ng/mL

## 2015-02-19 ENCOUNTER — Ambulatory Visit
Admit: 2015-02-19 | Disposition: A | Discharge status: Home / Self Care | Payer: Self-pay | Attending: Cardiovascular Disease | Admitting: Cardiovascular Disease

## 2015-03-02 NOTE — Op Note (Signed)
PATIENT NAME:  Carly Marks, Carly Marks MR#:  [id number] DATE OF BIRTH:  [birth year]  DATE OF PROCEDURE:  01/31/2014  PREOPERATIVE DIAGNOSES:  1. Acute renal failure.  2. Recently started on lithium, possible lithium toxicity.  3. Dehydration.   POSTOPERATIVE DIAGNOSES: 1. Acute renal failure.  2. Recently started on lithium, possible lithium toxicity.  3. Dehydration.   PROCEDURE: Insertion of right internal jugular triple-lumen catheter with ultrasound guidance for central venous access.   PROCEDURE PERFORMED BY: Melvyn Neth, PA-C, and Rica Koyanagi, MD, RVT   ANESTHESIA: One percent lidocaine.   ESTIMATED BLOOD LOSS: Minimal.   INDICATION: The patient was admitted with acute renal failure and possible lithium toxicity. She is in need of IV fluid hydration. Unable to obtain a peripheral IV access. Therefore, she needs a central line for more secure and reliable access for her IV hydration.   DESCRIPTION OF THE PROCEDURE: The patient is positioned supine. The right neck is prepped and draped in a sterile surgical fashion. Ultrasound is placed in a sterile sleeve. Jugular vein is identified. It is echolucent and compressible, indicating patency. Imaging is reported for permanent record.   Under direct visualization, the jugular vein is accessed. A J-wire is advanced without difficulty. Counterincision is made. Dilator is passed over the wire. The triple-lumen is advanced without problem. All 3 lumens aspirate and flush easily. The catheter is secured to the skin with 2-0 silk and a sterile dressing including a Biopatch is applied. Chest x-ray has been ordered and is pending to check placement.   The patient tolerated the procedure well.    ____________________________ Marin Shutter. Francie Keeling, PA-C cnh:lt D: 01/31/2014 18:15:53 ET T: 01/31/2014 22:55:12 ET JOB#: [id number]  cc: Marin Shutter. Vianney Kopecky, PA-C, <Dictator> [place] PA ELECTRONICALLY SIGNED 02/01/2014 9:31

## 2015-03-02 NOTE — Consult Note (Signed)
Psychiatry: Follow-up note for this patient was transferred from the psychiatry service.  She was brought to medicine because of toxic lithium level and elevated creatinine.  She is getting IV fluids currently.  On interview today the patient recognized me and was awake and alert and oriented to place and to year although be on that little bit confused.  She had some complaints that were disorganized and hard to understand.  She said she still had some hallucinations and was also feeling paranoid.  Said that her mood felt fine.  Denies suicidal ideation.  Affect was blunted and overall energy level was poor.  She remembered the visit last night she had from her family.  On examination of her labs today her creatinine is coming down below 2 as is her lithium level although it's coming down a little slowly.  The rest of the workup is fairly nonspecific from what I can see. Patient with psychotic disorder still probably bipolar disorder with depression versus psychotic depression.  Still a little bit confused.  I took her off her Risperdal last night at the family's urging and have started her on a low-dose of Abilify.  No obvious side effects at this point.  I explained all this to the patient.  No other change to psychiatric treatment right now.  I will continue to follow up and will be ready to transfer her back to psychiatry as soon as we are ready.  Electronic Signatures: Gonzella Lex (MD)  (Signed on 26-Mar-15 17:18)  Authored  Last Updated: 26-Mar-15 17:18 by Gonzella Lex (MD)

## 2015-03-02 NOTE — Discharge Summary (Signed)
PATIENT NAME:  Carly Marks, Carly Marks MR#:  [id number] DATE OF BIRTH:  [birth year]  DATE OF ADMISSION:  01/19/2014 DATE OF DISCHARGE:  01/31/2014  HOSPITAL COURSE: See dictated history and physical. A 59 year old woman was brought to the hospital with symptoms of recent thought disorder, mood lability, agitation and psychotic thinking. During her time in the hospital, she was treated with medication and individual and group psychotherapy. The history that I received from the patient and from her daughter to me sounded most likely consistent with a bipolar disorder with reports of episodes of hyperactivity and agitated thinking interspersed with episodes of depression and withdrawal. Because of this, I suggested starting lithium carbonate for the patient 300 mg twice a day beginning on the 16th as an addition to antipsychotics that she was getting. She was also on Risperdal and fluoxetine during her time in the hospital. The patient continued to be paranoid with expression of thoughts that someone was out to harm her and trying to get her. Mood remained depressed and anxious. After several days, she appeared to be looking more sedated. A lithium level was checked on March 24th and showed a toxic level of 2.16. The patient was hydrated, and lithium was discontinued. Lithium level later that day had declined to 2.04, but creatinine was checked and was elevated to 2.26, BUN elevated to 31, sodium low at 132. An internal medicine consult was requested that evening. It was suggested by them that the patient needed IV fluid. They wrote orders for IV fluid, but did not order transfer to the medicine ward at that time. The IV fluid appears not to have been initiated on the 24th. Medicine rounded again on the morning of the 25th and again wrote orders for IV fluid, which was again not initiated, but at that time, I was informed that it was not being initiated. I spoke to the internal medicine consultants at that point and told  them that nursing staff on psychiatry were not willing to run IV fluid on the psychiatry ward. At that point, the patient was arranged for transfer to medicine for further hydration and management of elevated lithium level and creatinine. The patient remained conscious and interactive throughout this time and was advised about the plan and situation repeatedly. On the 24th, after the first time I spoke to medicine, I called the patient's daughter to advise her that the patient was going to be transferred to medicine. At that time, that was my genuine understanding from my conversation with the consultant. After the patient was not transferred to medicine until the 25th, I apologized to the family. The patient was followed up then on the medicine ward, where I continued to consult. She did not engage in any suicidal or dangerous behavior during the time that she was on the psychiatry ward.   MENTAL STATUS EXAMINATION AT DISCHARGE: Awake but tired. Feels more fatigued. Oriented to place, time, situation, self. Affect blunted. Mood stated as depressed. Thoughts are slow. Some disorganization. She continues to have paranoid thoughts. Denies visual hallucinations, but continued to report auditory hallucinations intermittently. The patient was vague about suicidal ideation, would neither confirm nor deny. Homicidal ideation nonexistent. The patient was too fatigued to cooperate with full memory testing, but appeared to be cognitively slowed from her baseline.   LABORATORY RESULTS: Admission labs which had been done on March 12th showed a drug screen that was negative, alcohol level negative. Creatinine 0.97, BUN 11, bilirubin slightly elevated at 1.1, but the rest of the  chemistry panel normal. CBC: Slightly elevated red blood cell count, but otherwise unremarkable. Urinalysis: 3+ leukocyte esterase, 3+ bacteria. As mentioned previously, a lithium level done on the 24th was 2.16. Creatinine that day was 2.26, BUN 31,  sodium low at 132.   MEDICATIONS AT THE TIME OF DISCHARGE: Pantoprazole 40 mg per day, clonazepam 0.5 mg twice a day, aspirin 81 mg per day, fluoxetine 20 mg per day, Risperdal 4 mg at night, trazodone 25 mg at night. The lithium had already been discontinued.   DIAGNOSIS, PRINCIPAL AND PRIMARY:  AXIS I: Bipolar disorder, mixed episode.   SECONDARY DIAGNOSIS: AXIS I: Delirium due to medication.  AXIS II: No diagnosis.  AXIS III: Elevated creatinine and BUN related to elevated lithium level with lithium toxicity. Gastric reflux symptoms. History of high blood pressure.  AXIS IV: Moderate from chronic illness.  AXIS V: Functioning at the time of discharge 40.   ____________________________ Gonzella Lex, MD jtc:lb D: 02/20/2014 12:27:04 ET T: 02/20/2014 13:17:31 ET JOB#: [id number]  cc: Gonzella Lex, MD, <Dictator> Gonzella Lex MD ELECTRONICALLY SIGNED 02/20/2014 17:02

## 2015-03-02 NOTE — Consult Note (Signed)
CHIEF COMPLAINT and HISTORY:  Subjective/Chief Complaint Consulted for central linen placement   History of Present Illness 59 year old female admitted today with acute renal failure, recently started on lithium/possibl lithium toxicity, dehydration. Per nursing they are unable to get an IV, and patient is in need of IV fluid hydration and more se36 year old female admitted today with acute renal failure, recently started on lithium/possibl lithium toxicity, dehydration. Per nursing they are unable to get an IV, and patient is in need of IV fluid hydration and more secure IV access.cure IV access.   PAST MEDICAL/SURGICAL HISTORY:  Past Medical History:   Left Foot Deformity:    Arthritis:    reflux:    HTN:    DEPRESSION:    ASTHMA:    D & C:    tubal ligation:   ALLERGIES:  Allergies:  Penicillin: Other  Codeine: Other  HOME MEDICATIONS:  Home Medications: Medication Instructions Status  Aspir-Low 81 mg oral delayed release tablet 1 tab(s) orally once a day Active  fluoxetine 20 mg oral capsule 1 cap(s) orally once a day Active  oxybutynin 5 mg oral tablet three times daily Active  vitamin D 400iu daily Active  KlonoPIN 0.5 mg oral tablet 1 tab(s) orally once a day (at bedtime) Active  traZODone 150 mg oral tablet 1 tab(s) orally once a day (at bedtime) Active  amLODIPine-benazepril  orally  Active  omeprazole 20 mg oral delayed release capsule 1 cap(s) orally once a day Active   Family and Social History:  Family History Non-Contributory   Social History negative tobacco, negative ETOH, negative Illicit drugs   Review of Systems:  Fever/Chills No   Cough No   Sputum No   Abdominal Pain No   Diarrhea No   Constipation No   Nausea/Vomiting No   SOB/DOE Yes   Chest Pain No   Physical Exam:  GEN no acute distress   HEENT PERRL, hearing intact to voice, moist oral mucosa   NECK supple  No masses   RESP normal resp effort  clear BS   CARD  regular rate   ABD denies tenderness   EXTR positive edema   SKIN normal to palpation   NEURO cranial nerves intact   LABS:  Laboratory Results: Routine Chem:    25-Mar-15 07:01, Iron and IBC ([facility])  Iron Binding Capacity (TIBC) 228  Unbound Iron Binding Capacity 167  Iron, Serum 61  Iron Saturation 27  Result(s) reported on 31 Jan 2014 at 06:56PM.    25-Mar-15 91:63, Basic Metabolic Panel (w/Total Calcium)  Glucose, Serum 85  BUN 26  Creatinine (comp) 1.74  Sodium, Serum 133  Potassium, Serum 4.7  Chloride, Serum 104  CO2, Serum 25  Calcium (Total), Serum 9.1  Anion Gap 4  Osmolality (calc) 270  eGFR (African American) 37  eGFR (Non-African American) 32  eGFR values <61m/min/1.73 m2 may be an indication of chronic  kidney disease (CKD).  Calculated eGFR is useful in patients with stable renal function.  The eGFR calculation will not be reliable in acutely ill patients  when serum creatinine is changing rapidly. It is not useful in   patients on dialysis. The eGFR calculation may not be applicable  to patients at the low and high extremes of body sizes, pregnant  women, and vegetarians.  Routine Hem:    25-Mar-15 14:52, CBC Profile  WBC (CBC) 6.6  RBC (CBC) 5.28  Hemoglobin (CBC) 11.9  Hematocrit (CBC) 37.6  Platelet Count (CBC) 160  MCV 71  MCH 22.6  MCHC 31.7  RDW 14.8  Neutrophil % 71.2  Lymphocyte % 17.6  Monocyte % 7.2  Eosinophil % 3.4  Basophil % 0.6  Neutrophil # 4.7  Lymphocyte # 1.2  Monocyte # 0.5  Eosinophil # 0.2  Basophil # 0.0  Result(s) reported on 31 Jan 2014 at 03:08PM.   ASSESSMENT AND PLAN:  Assessment/Admission Diagnosis 59 year old female admitted today with acute renal failure, recently started on lithium/possibl lithium toxicity, dehydration. Per nursing they are unable to get an IV, and patient is in need of IV fluid hydration and more secure IV access.   Plan Right jugular central line was placed at bedside with Dr.  Delana Meyer. CXR ordered to check placement.   Electronic Signatures: Su Grand (PA-C)  (Signed 25-Mar-15 21:36)  Authored: Chief Complaint and History, PAST MEDICAL/SURGICAL HISTORY, ALLERGIES, HOME MEDICATIONS, Family and Social History, Review of Systems, Physical Exam, LABS, Assessment and Plan   Last Updated: 25-Mar-15 21:36 by Su Grand (PA-C)

## 2015-03-02 NOTE — Consult Note (Signed)
Psychiatry: Follow-up note for this patient with what I believe to probably be bipolar disorder.  She was transferred to the medical service after developing an elevated lithium level with elevated creatinine and BUN on the psychiatry service.  On examination today the patient says she is feeling tired.  She is feeling less shaky today.  Mood continues to be slightly better.  Denies any hallucinations.  Still has some paranoia. of systems positive for paranoia.  Denies suicidal ideation denies hallucinations.  Not feeling any pain.  Decreased tremulousness.  The rest of the full review of systems is negative. status exam: Moderately sedated woman.  She did respond to some questioning but mostly kept her eyes closed.  Affect smiling and calm.  Mood stated as being okay.  Thoughts decreased in quantity.  Slow.  Denies hallucinations.  Endorses mild paranoia.  Denies suicidal or homicidal ideation.  Patient was alert and oriented to where she was and current situation and date.  Short-term and long-term memory grossly intact fund of knowledge normal.results: Creatinine is coming down and his blow to as is the lithium level. Patient is responding to IV fluid and discontinuation of lithium.  Resolving symptoms of lithium toxicity.  Kidney function seems to be improving.  Still a little bit sedated and a little bit psychotic.  Patient's family were all gathered around today.  At least one of her daughters seemed irritated at the patient's condition.  I admitted that the lithium had turned out to not be a good idea.  Explained the situation to the family about how the current turn of events happened and how I could not predict that she would not tolerate lithium.  Discussed diagnosis.  Discussed treatment plan.  As far as antipsychotic the family seems to be against Risperdal although I don't perceive it causing major side effects.  Based on this I'm going to discontinue it and start her on Abilify 5 mg per day.plan:  Continue fluoxetine but discontinue the Risperdal.  Obviously discontinue lithium.  Start Abilify 5 mg a day for psychotic symptoms.  Psychoeducation done with the patient and the family.  Transfer back to psychiatry service once it is clear she is medically stabilizing and no longer needs IV fluid.  Diagnosis bipolar disorder type I mixed episode.  Electronic Signatures: Rosary Filosa, Madie Reno (MD)  (Signed on 25-Mar-15 23:25)  Authored  Last Updated: 25-Mar-15 23:25 by Gonzella Lex (MD)

## 2015-03-02 NOTE — Consult Note (Signed)
PATIENT NAME:  Carly Marks, LADLEY MR#:  [id number] DATE OF BIRTH:  Oct 07, 1956  DATE OF CONSULTATION:  01/19/2014  CONSULTING PHYSICIAN:  Gustavus Haskin K. Godson Pollan, MD  AGE: 59 years.  SEX: Female. RACE: African American.  SUBJECTIVE: The patient was seen in consultation in [place]. The patient is a 59 year old female who is not employed and has been living with family members. Very poor historian, and most of the information was obtained from the charts and from the nursing staff. According to the information obtained, the patient has been very erratic in her behavior.  CHIEF COMPLAINT: "I came here for chest palpitations."   OBJECTIVE: The patient is seen lying in bed, alert, and she knew she was in some kind of medical place. She said it was March 2015. Is psychotic and delusional. Has bizarre look on her face and does not appear to be in touch with reality and probably is responding to internal stimuli. Poor historian. Admits feeling hopeless and helpless sometimes. Admits feeling depressed. Admits feeling worthless and useless. Admits that she is seeing things crawling on the walls and they are trying to get her and she told her family. Appears to be scared and concerned. Insight and judgment guarded versus impaired.  IMPRESSION: Major depressive disorder with psychosis.  RECOMMENDATION: Admit patient to inpatient psychiatry when bed is available and start her on medications for her depression and her thoughts.   ____________________________ Wallace Cullens. Franchot Mimes, MD skc:jcm D: 01/19/2014 13:05:37 ET T: 01/19/2014 14:01:37 ET JOB#: [id number]  cc: Arlyn Leak K. Franchot Mimes, MD, <Dictator> Dewain Penning MD ELECTRONICALLY SIGNED 01/20/2014 22:20

## 2015-03-02 NOTE — Discharge Summary (Signed)
Dates of Admission and Diagnosis:  Date of Admission 31-Jan-2014   Date of Discharge 02-Feb-2014   Admitting Diagnosis Ac renal failure, Lithium toxicity   Final Diagnosis Ac renal failure due to ATN secondary to Lithium Bradycardia- baselin HR is 55-60- stable Lithium toxicity- resolved. Psychosis- management as per psychiatry team. Anemia- chronic Dysphagia- on soft diet. Hx of Htn- was on meds- but Blood pressure is normal without any meds.    Chief Complaint/History of Present Illness A 59 year old female who has history of recurrent episode of psychosis.  She is on psychiatry floor admitted for psychosis and she was started on lithium and Risperdal for more stabilization, was getting lithium 600 mg oral at bedtime daily.  Today they checked the lithium level which was elevated and also creatine level was high.  On admission creatinine was normal and today found more than 2, so hospitalist consult was called in for management of this issue.  I have seen the patient and ask her.  She denies any complaint other than some incontinence of urine every time whenever she coughs, otherwise she denies any complaint of pain or burning in the urination.  Denies any weakness or shortness of breath or palpitation.   Next day was notied more drowsy and having abnormal movements - so transferred to [facility] floor for further management.   Allergies:  Penicillin: Other  Codeine: Other  TDMs:  26-Mar-15 04:11   Lithium, Serum  1.55 (0.60-1.20 TOXIC >= 1.50 mmol/L)  27-Mar-15 04:31   Lithium, Serum 1.16 (0.60-1.20 TOXIC >= 1.50 mmol/L)  Routine Chem:  25-Mar-15 14:52   Glucose, Serum 85  BUN  26  Creatinine (comp)  1.74  Sodium, Serum  133  Potassium, Serum 4.7  Chloride, Serum 104  CO2, Serum 25  Calcium (Total), Serum 9.1  Anion Gap  4  Osmolality (calc) 270  eGFR (African American)  37  eGFR (Non-African American)  32 (eGFR values <54m/min/1.73 m2 may be an indication of  chronic kidney disease (CKD). Calculated eGFR is useful in patients with stable renal function. The eGFR calculation will not be reliable in acutely ill patients when serum creatinine is changing rapidly. It is not useful in  patients on dialysis. The eGFR calculation may not be applicable to patients at the low and high extremes of body sizes, pregnant women, and vegetarians.)  26-Mar-15 04:11   Creatinine (comp)  1.45  27-Mar-15 04:31   Creatinine (comp) 1.28  Routine Hem:  25-Mar-15 14:52   WBC (CBC) 6.6  RBC (CBC)  5.28  Hemoglobin (CBC)  11.9  Hematocrit (CBC) 37.6  Platelet Count (CBC) 160  MCV  71  MCH  22.6  MCHC  31.7  RDW  14.8  Neutrophil % 71.2  Lymphocyte % 17.6  Monocyte % 7.2  Eosinophil % 3.4  Basophil % 0.6  Neutrophil # 4.7  Lymphocyte # 1.2  Monocyte # 0.5  Eosinophil # 0.2  Basophil # 0.0 (Result(s) reported on 31 Jan 2014 at 03:08PM.)   PERTINENT RADIOLOGY STUDIES: [place]    25-Mar-15 12:47, [place] Bilateral  [place] Bilateral   REASON FOR EXAM:    ARF  COMMENTS:       PROCEDURE: [place] - [place]IDNEY  - Jan 31 2014 12:47PM     CLINICAL DATA:  Previously.    EXAM:  RENAL/URINARY TRACT ULTRASOUND COMPLETE    COMPARISON:  None.    FINDINGS:  Right Kidney:  Length: 10.1 cm. The echotexture of the renal cortex on  the right is  slightly lower than that of the adjacent liver. In the lateral  aspect of the midpole there is a 1.7 cm diameter cyst. In the upper  pole there is a hyperechoic focus measuring 6 mm in greatest  dimension.. There is no hydronephrosis.    Left Kidney:    Length: 11.3 cm. There is a hyperechoic focus laterally in the  midpole measuring 1.3 cm in greatest dimension most compatible with  an angiomyolipoma.    Bladder:    Appears normal for degree of bladder distention.  There is a tiny right pleural effusion     IMPRESSION:  1. There is no evidence of hydronephrosis.  2. The echotexture of the renal parenchyma  cortex remains lower than  that of the adjacent liver.  3. There is a simple appearing cyst laterally in the midpole of the  right kidney. 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      Electronically Signed    By: David  Martinique    On: 01/31/2014 14:26     Verified By: DAVID A. Martinique, M.D., MD   Pertinent Past History:  Pertinent Past History Psychiatric issues, hypertension.   Hospital Course:  Hospital Course 1. Ac renal failrue, likley due to ATN, also lithium toxicity,IV fluids, nephrology following, Improved kidney function. stop IV fluids  2. hypotesnion, IVF, Foley, following ins/outs- improved with fluids. Was taking meds for Htn- spoke to her and her son- no need for meds from now. 3.anemia, stable. 4. bradycardia, likley lithium toxicity, telemetry ordered, following- rate satisfactory in 50-55, no symptoms. May be baseline. 5 psychosis, ? bipolar - meds per psych 6 AMS, likley lithium toxicity related toxic encephalopathy , neurochecks every 4 hours- more alert once lithium level came down. 7 dysphagia, got SLp - suggested necter thick, continue medications.   Condition on Discharge Stable   Code Status:  Code Status Full Code   DISCHARGE INSTRUCTIONS HOME MEDS:  Medication Reconciliation: Patient's Home Medications at Discharge:     Medication Instructions  aspir-low 81 mg oral delayed release tablet  1 tab(s) orally once a day   fluoxetine 20 mg oral capsule  1 cap(s) orally once a day   vitamin d  400iu daily   klonopin 0.5 mg oral tablet  1 tab(s) orally once a day (at bedtime)   trazodone 150 mg oral tablet  1 tab(s) orally once a day (at bedtime)   omeprazole 20 mg oral delayed release capsule  1 cap(s) orally once a day     Physician's Instructions:  Diet Regular   Activity Limitations As tolerated   Return to Work Not Applicable   Time frame for Follow Up Appointment 2-4 weeks   Other Comments being  transferred to behavioural unit. Psych meds as per Behavioural doctor's advise. No need for Blood pressure meds. Folllow with PMD in 3-4 weeks as routine.   Electronic Signatures: Vaughan Basta (MD)  (Signed 31-Mar-15 15:15)  Authored: ADMISSION DATE AND DIAGNOSIS, CHIEF COMPLAINT/HPI, Allergies, PERTINENT LABS, PERTINENT RADIOLOGY STUDIES, PERTINENT PAST HISTORY, HOSPITAL COURSE, DISCHARGE INSTRUCTIONS HOME MEDS, PATIENT INSTRUCTIONS   Last Updated: 31-Mar-15 15:15 by Vaughan Basta (MD)

## 2015-03-02 NOTE — Consult Note (Signed)
Psychiatry: Patient seen. Discussed with oncal hospitalist. Patient's creatinine is continuing to come down as is Li level. She is more alert and eating and drinking better. Still paranoid but affect better. can be transfered to [facility]. Please remove central line and do discharge orders and I will notify BH intake that she can come back down. Will follow up after transfer to psych.  Thanks.  Electronic Signatures: Gonzella Lex (MD)  (Signed on 27-Mar-15 14:04)  Authored  Last Updated: 27-Mar-15 14:04 by Gonzella Lex (MD)

## 2015-03-02 NOTE — Consult Note (Signed)
Brief Consult Note: Diagnosis: ac renal failure, lithium level high.   Patient was seen by consultant.   Consult note dictated.   Orders entered.   Comments: will follow.  Encourage oral water intake- 2-3 ltr / day.  Recheck labs. No signs of lithium toxicity at this time.  Electronic Signatures: Vaughan Basta (MD)  (Signed [phone] 22:23)  Authored: Brief Consult Note   Last Updated: 24-Mar-15 22:23 by Vaughan Basta (MD)

## 2015-03-02 NOTE — Consult Note (Signed)
PATIENT NAME:  Carly Marks, Carly Marks MR#:  [id number] DATE OF BIRTH:  09-21-1956  DATE OF CONSULTATION:  01/21/2014  REFERRING PHYSICIAN:   CONSULTING PHYSICIAN:  Keyari Kleeman K. Franchot Mimes, MD  SUBJECTIVE: Staff reports the patient has been cooperative, compliant with medications. The patient reports that she is eager to go home to make adjustments with her life in general.   OBJECTIVE: The patient is dressed in street clothes, alert and oriented to place, person, and time with a little prompting and help. She realizes that this is some medical place. Admits that she is still depressed and feels hopeless and helpless. She reports that she is still hearing voices and these voices are telling her to get out of the house more. Cannot identify this voice. She reports that the voices tell her to be more dependent. She is paranoid and suspicious about people around her. She reports that people around her are trying to get her and this is a big concern to her and she realizes that she has to go home to take care of these people who are trying to get her. Insight and judgment guarded versus impaired.   IMPRESSION: Major depression with psychosis.  PLAN: Continue to adjust medicatuions and watch.Will increase her Risperdal to 5 mg p.o. b.i.d. for better control of her thoughts.    ____________________________ Wallace Cullens. Franchot Mimes, MD skc:sb D: 01/21/2014 19:46:30 ET T: 01/22/2014 07:29:39 ET JOB#: [id number]  cc: Arlyn Leak K. Franchot Mimes, MD, <Dictator> Dewain Penning MD ELECTRONICALLY SIGNED 01/23/2014 20:23

## 2015-03-02 NOTE — Consult Note (Signed)
PATIENT NAME:  Carly Marks, Carly Marks MR#:  [id number] DATE OF BIRTH:  1956-04-09  DATE OF CONSULTATION:  01/30/2014  REFERRING PHYSICIAN:  Dr. Alethia Berthold CONSULTING PHYSICIAN:  Ceasar Lund. Anselm Jungling, MD  REASON FOR MEDICAL CONSULTATION:  Acute renal failure, lithium overdose.   HISTORY OF PRESENT ILLNESS:  A 59 year old female who has history of recurrent episode of psychosis.  She is on psychiatry floor admitted for psychosis and she was started on lithium and Risperdal for more stabilization, was getting lithium 600 mg oral at bedtime daily.  Today they checked the lithium level which was elevated and also creatine level was high.  On admission creatinine was normal and today found more than 2, so hospitalist consult was called in for management of this issue.  I have seen the patient and ask her.  She denies any complaint other than some incontinence of urine every time whenever she coughs, otherwise she denies any complaint of pain or burning in the urination.  Denies any weakness or shortness of breath or palpitation.   REVIEW OF SYSTEMS:  CONSTITUTIONAL:  Negative for fever, fatigue, weakness, pain or weight loss.  EYES:  No blurring, double vision, discharge or redness.  EARS, NOSE, THROAT:  No tinnitus, ear pain or hearing loss.  RESPIRATORY:  No cough, wheezing, hemoptysis, or shortness of breath.  CARDIOVASCULAR:  No chest pain, orthopnea, edema, arrhythmia or palpitations.  GASTROINTESTINAL:  No nausea, vomiting, diarrhea, abdominal pain.  GENITOURINARY:  No dysuria, hematuria, has some urinary incontinence.  ENDOCRINE:  No heat or cold intolerance.  No increased sweating.  NEUROLOGICAL:  No numbness, weakness, tremor or vertigo.   PSYCHIATRIC:  Having some psychosis or bipolar disorder and mood disorder.   PAST MEDICAL HISTORY:  Psychiatric issues, hypertension.   PAST SURGICAL HISTORY:  Had some keloid surgery on the left ear lobe.   SOCIAL HISTORY:  Lives with her mother.  She  is a smoker.  She denies alcohol or illegal drug use.   FAMILY HISTORY:  Positive for diabetes in her mother.   MEDICATIONS:  Currently:  1.  Ciprofloxacin tablet 500 mg every 12 hours, started on the 22nd of March.  2.  Clonazepam 0.5 mg at bedtime.  3.  Fluoxetine 20 mg oral daily.  4.  She was also on lisinopril 10 mg oral daily, which was discontinued today.  5.  She was on lithium 600 mg at bedtime, which is also discontinued today.  6.  Pantoprazole 40 mg oral daily.  7.  Risperidone 4 mg oral at bedtime.  8.  Tolterodine capsule, that is Detrol, 4 mg oral daily, which is discontinued today.  9.  Trazodone 25 mg oral at bedtime.  10.  Aspirin 81 mg daily.  11.  Clonidine 0.2 mg patch transdermal weekly.   IMPORTANT LABORATORY RESULTS:  Glucose 92, BUN today 31, creatinine 2.26, sodium 132, potassium 4.8, chloride 102, CO2 29, anion gap is 1, calcium is 9.  Lithium level was 2.16 in the morning and in the afternoon it came to 2.04.  PHYSICAL EXAMINATION:   GENERAL:  The patient is alert and oriented.  She is not in any acute distress.  HEENT:  Head and neck atraumatic.  Conjunctivae pink.  Oral mucosa moist.  NECK:  Supple.  No JVD.  RESPIRATORY:  Bilateral clear and equal air entry.  CARDIOVASCULAR:  S1, S2 present, regular.  No murmur.  ABDOMEN:  Soft, nontender.  Bowel sounds present.  No organomegaly.  SKIN:  No rashes.  LEGS:  No edema.  NEUROLOGICAL:  She is having power 5 out of 5 in all four limbs.  No tremors.  Follows commands.  No abnormal movements.   ASSESSMENT AND RECOMMENDATIONS:  A 59 year old female who has psychosis and mood disorder, admitted to psychiatry floor for further management of this issue.  Medical consultation is called in for acute renal failure and elevated lithium blood levels.  1.  Acute renal failure.  The patient's creatinine one week ago on admission was normal.  This might be due to dehydration.  The patient also has some urinary tract  infection and she is on treatment for that, so I ordered a repeat urinalysis and encouraged the patient to drink more water.  We will give some IV fluid and repeat it tomorrow morning.  2.  Elevated blood level of lithium.  Lithium level is in toxic range.  It was 2.16, came down to 2.04 in the afternoon.  Currently, there were no signs of lithium toxicity.  She is hemodynamically stable.  There is not any abnormal movement, tremors or mental level changes, so we would like to just give IV fluids, encourage oral water intake and check the level tomorrow.  We will also call nephrology consult for further management of this issue if helped required.  3.  Hypertension.  Currently blood pressure is under control and hypertensive medications were discontinued by psychiatry.  I agree with this decision.  For lithium high level, we will also check EKG for any findings of conduction problem.  4.  Tobacco abuse.  Smoking cessation counseling is done for four minutes and offered the patient nicotine patch.  She refused to use that and she agrees to try quitting smoking.  Total time spent in this counseling is 4 minutes.   Thank you for letting us participate in the care of this patient.   Total time spent on this medical consult 45 minutes.    ____________________________  vgv:ea D: 01/30/2014 22:39:28 ET T: 01/30/2014 23:01:03 ET JOB#: [id number]  cc: Ceasar Lund. Anselm Jungling, MD, <Dictator> Vaughan Basta MD ELECTRONICALLY SIGNED 02/06/2014 18:28

## 2015-03-02 NOTE — H&P (Signed)
PATIENT NAME:  Carly Marks, Carly Marks MR#:  [id number] DATE OF BIRTH:  Feb 13, 1956  DATE OF ADMISSION:  01/19/2014  SEX: Female. RACE: African American. AGE: 59 years.  IDENTIFYING INFORMATION: The patient is a 59 year old African American female not employed, last worked at [employer] in 2010, quit because of health problems. The patient never married and lives with her daughter and they have 2 children. All 4 of them live in a mobile home. The patient comes to inpatient psychiatry by [facility] with a chief complaint of feeling depressed, feeling hopeless and helpless and having suicidal ideations and thoughts.   CHIEF COMPLAINT: According to the patient chief complaint was "I came here for chest palpitations".  HISTORY OF PRESENT ILLNESS: According to information obtained, the patient had been feeling very down and depressed and came here for help.  PAST PSYCHIATRIC HISTORY: The patient has seen a psychiatrist on 2 occasions and once she was admitted to [facility] for depression. When she was asked about suicide attempts she could not answer. She is being followed by Dr. Jone Baseman, last appointment was 2 weeks ago.  Next appointment is coming soon. The patient reports that she is compliant with current medications.   FAMILY HISTORY OF MENTAL ILLNESS: The patient reports that  2 of her sisters have depression. No known history of suicide in the family.   FAMILY HISTORY: Raised by parents. Father worked and died of emphysema and lung problems. Father did janitorial work. Mother living at 67 years old. Mother worked at a plant. She has 10 siblings, close to family.  PERSONAL HISTORY: Born and raised in [place]. Dropped out in tenth grade, just did not want to go to school. No GED.   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   MILITARY STATUS: None.   MARRIAGES: Never married. Has 3 children, close to her children.  ALCOHOL  AND DRUGS: Denies drinking alcohol. Denies street drugs or prescription drug abuse. Denies smoking cigarettes.   PAST MEDICAL HISTORY: Has high blood pressure. No known diabetes mellitus. Status post tubal ligation. No major injuries. No history of motor vehicle accident or being unconscious.  ALLERGEIS: CODEINE AND PENICILLIN.  The patient is being followed at [facility] clinic. Last appointment was a couple of weeks ago. Next appointment is coming up in March some time.   PHYSICAL EXAMINATION:   VITAL SIGNS: Temperature 98.2, pulse 80 per minute, regular, respirations 18 per minute, regular, blood pressure is 120/78 mmHg. HEENT: Head is normocephalic, atraumatic. Eyes: PERRLA.  CHEST: Normal expansion. Normal breath sounds heard. HEART: Normal S1 without any murmurs or gallops. ABDOMEN: Soft. Very obese. No organomegaly. Bowel sounds heard. RECTAL: Deferred. NEUROLOGIC: Gait is normal. Romberg is negative. Cranial nerves II through XII grossly intact and normal.   MENTAL STATUS EXAMINATION: The patient is dressed in hospital scrubs. Alert and oriented x 3. She knows she is in some medical place. She knew she was in [place] and today is March 2015. She did not know exact date. She did not know the capital of [place]. She knew that the President lives in [place]. Affect is flat. Mood depressed. Admits to feeling hopeless and helpless. Mood is restricted. Does admit to feeling worthless and useless. She does have suicidal wishes and thoughts, but denies any plans and wants to get help. A poor historian and the same question had to be repeated several times. Admits that she feels  paranoid about people around and does not trust people around. Admits that she sees bugs crawling on the walls at times, but denies any auditory hallucinations. She is scared and concerned and does not trust people. She could not spell the word "world". Cognition is below average. General knowledge of  information is below average. She had difficulty counting money. Regarding judgment for "fire' she said she would follow the rules. Insight and judgment are intact.  IMPRESSION:  AXIS I:  Major depressive disorder, recurrent with psychosis,  AXIS II: Deferred. AXIS II:  Status post tubal ligation, exogenous obesity, hypertension. AXIS IV: Severe. Long history of mental illness and a poor historian and probably noncompliant with medications. AXIS V: Global assessment of functioning 25.  PLAN: The patient is admitted to [facility] for close observation and evaluation. She will be started back on medications, which will be adjusted. During the stay in the hospital, her medications will be adjusted and at the time of discharge will be stabilized and appropriate followup appointments will be made in the community. She will start attending groups when she is able to do so. ____________________________ Wallace Cullens. Franchot Mimes, MD skc:aw D: 01/20/2014 22:11:09 ET T: 01/21/2014 10:47:02 ET JOB#: [id number]  cc: Arlyn Leak K. Franchot Mimes, MD, <Dictator> Dewain Penning MD ELECTRONICALLY SIGNED 01/21/2014 19:55

## 2015-03-02 NOTE — H&P (Signed)
PATIENT NAME:  Carly Marks, Carly Marks MR#:  [id number] DATE OF BIRTH:  [birth year]  DATE OF ADMISSION:  02/02/2014  IDENTIFYING INFORMATION AND CHIEF COMPLAINT:  A 59 year old woman with a past history of recurrent episodes of mental illness who presented to the hospital initially with a recent history of altered mental status, increased confusion, mood changes, paranoia.  She was treated on the psychiatry service for several days before developing worsening confusion and it was discovered that she had an elevated lithium level and an elevated creatinine which was a change from her baseline.  She had been transferred earlier this week to the medicine service where she has been receiving IV fluids for several days.  She is now transferred back to behavioral health.  On interview today she tells me that her mood is a little better, but then tells me she still feels that someone is out to get her and that she is hearing voices, although she cannot tell what they say.  She says she continues to feel confused.  Physically, she is still feeling weak and having a little bit of trouble walking with some dizziness and weakness.   PAST PSYCHIATRIC HISTORY:  Recurrent episodes of depression, but also psychosis and what sounds like manic episodes in the past.  Had been reasonably stable.  There had been some changes in her outpatient medicine when her psychiatrist discontinued antipsychotics.  She does not have a history of violence to others.  No recent acute suicidality.  Does have prior hospital treatment.   FAMILY HISTORY:  Positive it sounds like for several people with bipolar disorder and other psychotic disorders.   SOCIAL HISTORY:  The patient lives with her daughter.  She has adult children.  Seems to have a reasonably good relationship with them.  Not working.   PAST MEDICAL HISTORY:  History of high blood pressure.  No history of diabetes, status post tubal ligation in the past.  No known history of head injury.    REVIEW OF SYSTEMS:  The patient states that her mood is feeling okay and feeling "better."  Denies feeling depressed.  Does report that she still has a paranoid feeling.  Also endorses auditory hallucinations.  Feels weak and still gets dizzy when she stands up.  The rest of the full review of systems is negative.   MENTAL STATUS EXAMINATION:  Reasonably groomed woman who looks older than her stated age.  Cooperative with the interview, but passive.  Eye contact good.  Psychomotor activity sluggish.  Speech decreased in total amount.  Affect still a little blunted.  Mood stated as being okay.  Thoughts seem slow and a little bit scattered still.  Endorses paranoia and hallucinations.  Insight and judgment impaired.  Short-term memory impaired, one out of three objects at three minutes.  Long-term memory probably grossly intact.   PHYSICAL EXAMINATION: GENERAL:  The patient was a little dizzy and weak standing up.  She has no acute skin lesions other than the site on her right side of her neck where the central line was recently removed, that seems to be healing okay.  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  No acute problem there.  HEENT:  Pupils equal and reactive.  Face symmetric.  Oral mucosa dry.  NECK AND BACK:  Nontender.  Decreased range of motion really at all extremities.  Appears to have some arthritis probably.  Strength and reflexes decreased in part due to  effort, but symmetric.  Cranial nerves symmetric.  LUNGS:  Clear with no wheezes.  HEART:  Regular rate and rhythm.  ABDOMEN:  Soft, nontender, normal bowel sounds.  VITAL SIGNS:  Show temperature 98.2, pulse 67, respirations 20, blood pressure 109/72.   LABORATORY RESULTS:  Urinalysis a little bit of blood, some red cells.  Does not appear to be clearly infected.  Lithium level is now down to 0.79.  Creatinine is down to 1.2.  CBC:  Hematocrit low at 34.5.   ASSESSMENT:  A  59 year old woman with a history of recurrent episodes of depression and psychosis.  Thought to have bipolar disorder.  Was not able to tolerate even a small dose of lithium and developed lithium toxicity.  Now recovering that with improvement in her renal function as well.  Seems to be nearly back to the baseline she was at before going to medicine.  We discussed the medication treatment plan.  The family continues to have suspicions about Risperdal and the patient was not enthusiastic about Abilify for reasons I cannot understand.  We will continue Prozac 20 mg a day.  I suggested we start Invega 3 mg at night because she said something about wanting a shot.  That could potentially be switched over to an injectable medicine if it works.  Encouraged her to try and attend groups to make sure she is drinking well.  Recheck labs over the next couple of days.   DIAGNOSIS, PRINCIPAL AND PRIMARY:  AXIS I:  Depression, recurrent, with psychotic features.   SECONDARY DIAGNOSES: AXIS I:  Deferred.  AXIS II:  No diagnosis.  AXIS III:  Status post lithium toxicity, resolving, high blood pressure.  AXIS IV:  Severe from illness and isolation.  AXIS V:  Functioning at time of evaluation 54.    ____________________________ Gonzella Lex, MD jtc:ea D: 02/03/2014 22:10:36 ET T: 02/03/2014 23:33:57 ET JOB#: [id number]  cc: Gonzella Lex, MD, <Dictator> Gonzella Lex MD ELECTRONICALLY SIGNED 02/05/2014 9:54

## 2015-03-02 NOTE — Discharge Summary (Signed)
PATIENT NAME:  Carly Marks, Carly Marks MR#:  [id number] DATE OF BIRTH:  [birth year]  DATE OF ADMISSION:  02/02/2014 DATE OF DISCHARGE:  02/07/2014  HOSPITAL COURSE:  See dictated history and physical for details of admission.  A 59 year old woman who had been originally on psychiatry then transferred to the medical service and now back to psychiatry.  She is recovering from lithium toxicity and also from a depression with psychosis and suicidal ideation.  Over the course of several days she was back on the unit the patient did not display any dangerous or suicidal behavior.  Affect and mood gradually improved.  She started to resolve her psychotic symptoms.  Lithium level was checked several times and showed continued decrease, although she is clearing it rather slowly.  The patient was able to get up and walk around, take care of her ADLs and appeared to become much more alert and interactive.  At the time of discharge, she is stating that her mood is good.  She denies psychotic symptoms and she is agreeable to outpatient treatment at [facility].  She will be staying with her family in the community.  She has been educated about the importance of staying on current medicine and following up because of mental health issues.   LABORATORY RESULTS:  On March 30th, chemistry panel showed creatinine down to 1.26.  Lithium level at 0.61.  On the 31st, Lithium was down to 0.55.  On April 1st, the day of discharge, lithium was down to 0.41.   DISCHARGE MEDICATIONS:  Fluoxetine 20 mg once a day, trazodone 100 mg at night, aspirin 81 mg per day, vitamin D 400 international units once a day, Invega 3 mg once a day at night, docusate 200 mg twice a day, pantoprazole 40 mg 2 times a day.   DISCHARGE MENTAL STATUS EXAMINATION:  Well-groomed woman, looks her stated age, cooperative with the interview.  Good eye contact.  Psychomotor activity still a little bit slow.  Speech easy to understand, although still a little bit decreased  in total amount.  Affect more reactive and appropriate.  Mood stated as being okay.  Thoughts are lucid without loosening of associations.  No sign of delusions.  Denies auditory or visual hallucinations.  Denies suicidal or homicidal ideation.  Judgment and insight improved.  Alert and oriented x 4.  Adequate fund of knowledge.  Short and long-term memory intact.   DISPOSITION:  Discharge home with her family.  Follow up at [facility].   DIAGNOSIS, PRINCIPAL AND PRIMARY:  AXIS I:  Bipolar disorder type II, recently depressed.   SECONDARY DIAGNOSES: AXIS I:  Delirium from lithium toxicity, resolving.  AXIS II:  No diagnosis.  AXIS III:  Gastric reflux symptoms, recent lithium toxicity, resolving.  Constipation.  AXIS IV:  Moderate stress from illness.  AXIS V:  Functioning at time of discharge 55.   ____________________________ Gonzella Lex, MD jtc:ea D: 02/27/2014 22:55:32 ET T: 02/27/2014 23:07:05 ET JOB#: [id number]  cc: Gonzella Lex, MD, <Dictator> Gonzella Lex MD ELECTRONICALLY SIGNED 02/28/2014 14:00

## 2015-03-10 NOTE — Consult Note (Signed)
PATIENT NAME:  Carly Marks, KADAR MR#:  [id number] DATE OF BIRTH:  1956/03/29  DATE OF CONSULTATION:  02/04/2015  REFERRING PHYSICIAN:   CONSULTING PHYSICIAN:  Kelby Fam. Kodie Pick, PA-C  INDICATION FOR CONSULTATION: Chest pain and palpitations.   HISTORY OF PRESENT ILLNESS: This is a 59 year old African American female with a past medical history of hypertension and GERD who presented to the Emergency Room last night complaining of palpitations and indigestion around 2:00 a.m. She states yesterday she had many family members over and a larger than normal meal. She felt  tired after eating such a large meal and woke up around 2:00 a.m. with a racing heart rate. She denies any substernal chest pain or pressure, but does admit to mild shortness of breath with episode of tachycardia. She is currently feeling much better. No chest pain, palpitations or shortness of breath. Of note, the patient has had 2 negative cardiac catheterizations by Dr. Humphrey Rolls, one in August 2010 and one in October of 2012, both with no significant CAD and normal LVEF. The patient also had a stress test in the office in August of 2014, but had no EKG changes and probably normal perfusion defects. Also of note, the patient was just seen in the office on Thursday and was doing just fine with last echo being about 5 months ago showing normal LVEF, mild to moderate MR, moderate TR, and grade I diastolic dysfunction.   HOME MEDICATIONS: Include aspirin 81 mg, Lotrel 5/10 mg, omeprazole 20 mg once daily, Prozac 40 mg once daily, trazodone 100 mg at bedtime.   PAST MEDICAL HISTORY: Obstructive sleep apnea on CPAP, asthma, GERD, gout and migraine headaches.   SOCIAL HISTORY: No tobacco, alcohol or other illicit drug use.   FAMILY HISTORY: Positive for CAD in sister.   ALLERGIES: CODEINE DERIVATIVES AND PENICILLIN.   REVIEW OF SYSTEMS: SYSTEMIC: No fatigue or malaise.  CARDIOVASCULAR: No chest pain, discomfort or palpitations.  PULMONARY:  No shortness of breath, cough or wheezing.  GASTROINTESTINAL: No further heartburn, nausea, vomiting, or abdominal pain.   PHYSICAL EXAMINATION: GENERAL: A and O x3, in no acute distress, eating lunch.  CARDIOVASCULAR: Normal S1, S2. No audible murmur.  LUNGS: Clear to auscultation bilaterally. No wheezes, rhonchi or rales.  ABDOMEN: Soft, nontender. Positive bowel sounds.   DIAGNOSTIC DATA: Troponin first set 0.03, second set 0.16. Creatinine 1.13   EKG shows normal sinus rhythm, 65 beats per minute. Nonspecific ST and T changes.   ASSESSMENT AND RECOMMENDATION: Episode of palpitations is likely secondary to recent start of Seroquel in combination with large meal, high stress situation with multiple family members over. As stated the patient has had 2 negative cardiac catheterizations and negative stress test. We will await on last set of troponin. If not greater than 0.16, the patient can go home and we will order a nuclear stress test as an outpatient tomorrow for the patient, also advise giving the patient a prescription for metoprolol tartrate 25 mg b.i.d. We will continue to follow.   Thank you very much for this consultation.   ____________________________ Kelby Fam [place] ear:sb D: 02/04/2015 13:34:07 ET T: 02/04/2015 13:53:13 ET JOB#: [id number]  cc: Dyann Ruddle A. Baldwin [place], <Dictator> Kelby Fam Zakaria Fromer PA ELECTRONICALLY SIGNED 02/08/2015 13:46

## 2015-03-10 NOTE — Discharge Summary (Signed)
PATIENT NAME:  Carly Marks, Carly Marks MR#:  [id number] DATE OF BIRTH:  06-15-1956  DATE OF ADMISSION:  02/04/2015 DATE OF DISCHARGE:  02/04/2015  PRIMARY CARE PHYSICIAN:  Perrin Maltese, MD   CARDIOLOGIST:  Dionisio David, MD   CHIEF COMPLAINT:  Complains of chest pain and palpitations.   HISTORY OF PRESENT ILLNESS:  The patient is a 59 year old African-American female with past medical history of hypertension and GERD along with history of bipolar disorder, who comes to the Emergency Room with complaints of palpitations that woke her up at around 4:00. During my evaluation, she did not have palpitations. She had some minimal chest pain and chest tightness earlier that had resolved. Her first troponin was 0.03, second one was 0.16, and a hospitalist was called for possible evaluation of acute coronary syndrome. She was started on IV heparin drip. The patient does not have any known cardiac history in the past.   PAST MEDICAL HISTORY: 1.  Anxiety.  2.  Bipolar disorder.  3.  History of arthritis.  4.  GERD. 5.  Hypertension.  6.  Depression.  7.  History of D and C.  8.  Tubal ligation.  9.  She has had a normal cardiac catheterization according to the cardiology evaluation and normal stress test. Her last cardiac catheterization was done in 2012, both of which do not have any significant CAD.   SOCIAL HISTORY:  No tobacco, alcohol, or illicit drugs.   FAMILY HISTORY:  Positive for CAD in sister.   ALLERGIES:  Codeine, derivatives of penicillin.   REVIEW OF SYSTEMS: CONSTITUTIONAL:  No fever, fatigue, or weakness.  EYES:  No blurred or double vision.  EARS, NOSE, AND THROAT:  No tinnitus, ear pain, or hearing loss.  RESPIRATORY:  No cough, wheeze, or hemoptysis.  CARDIOVASCULAR:  No chest pain at present. No orthopnea or edema. Positive for hypertension.  GASTROINTESTINAL:  No nausea, vomiting, diarrhea, or abdominal pain.  GENITOURINARY:  No dysuria, hematuria, or frequency.   ENDOCRINE:  No polyuria, nocturia, or thyroid problems.  HEMATOLOGIC:  No anemia or easy bruising.  SKIN:  No acne or rash.  MUSCULOSKELETAL:  Positive for arthritis.  NEUROLOGIC:  No CVA or TIA.  PSYCHIATRIC:  Positive for anxiety. No depression.   All other systems reviewed and negative. EKG shows normal sinus rhythm.   PHYSICAL EXAMINATION: GENERAL:  The patient is awake, alert, and oriented x 3, not in acute distress.  VITAL SIGNS:  Afebrile, pulse is 44 to 50, blood pressure is 135/69, and saturations are 94% on room air.  HEENT:  Atraumatic, normocephalic. PERRLA, EOMI intact. Oral mucosa is moist.  NECK:  Supple. No JVD. No carotid bruit.  LUNGS:  Clear to auscultation bilaterally. No rales, rhonchi, respiratory distress or labored breathing.  CARDIOVASCULAR:  Both heart sounds are normal. Rate and rhythm are regular. PMI is not lateralized.  CHEST:  Nontender. Good pedal pulses. Good femoral pulses. No lower extremity edema.  ABDOMEN:  Soft, benign, nontender. No organomegaly. Positive bowel sounds.  NEUROLOGIC:  Grossly intact cranial nerves II through XII. No motor or sensory deficits.  PSYCHIATRIC:  The patient is awake, alert, and oriented x 3.  SKIN:  Warm and dry.   EKG:  Normal sinus rhythm. First troponin is 0.03, 0.16, and 0.17. CK-MB and CPK are within normal limits.   Chest x-ray:  No acute cardiopulmonary abnormality, mild bibasilar atelectasis.   Magnesium is 1.8. CBC is within normal limits. MCV is 7.71. Creatinine is 1.1.  LFTs are within normal limits. Lipid profile:  LDL is 100. B-type natruretic peptide is 74.   ASSESSMENT:  Ms. Molesky is a 59 year old with history of bipolar disorder, anxiety, and hypertension, who comes in with:   1.  Chest pain, palpitations and positive troponins, concerning for acute coronary syndrome. The patient's symptoms had resolved by the time had seen her. She was started on aspirin, received some beta blockers, and started on a  heparin drip by ER physician. Cardiology consultation was obtained with Dr. Humphrey Rolls. Case was discussed with him. The patient's symptoms had resolved. Her EKG did not show any acute ST elevation or depression. She has had 2 normal catheterizations in the past; last one was in 2012. She has had a stress test done as well in the past. She will be scheduled for a stress test as an outpatient by cardiology. She will continue aspirin and her beta blockers along with her home medications. She tolerated a diet in the Emergency Room well.  2.  Hypertension. Continue metoprolol, amlodipine, and benazepril.  3.  Gastroesophageal reflux disease, on omeprazole.  4.  History of bipolar disorder. Continue her psychiatric medications.  5.  Deep vein thrombosis prophylaxis. The patient already is on heparin drip.   Further workup regarding the patient's clinical course.   TIME SPENT:  45 minutes.    ____________________________ Hart Rochester Posey Pronto, MD sap:nb D: 02/04/2015 15:46:54 ET T: 02/05/2015 04:54:28 ET JOB#: [id number]  cc: Calene Paradiso A. Posey Pronto, MD, <Dictator> Ilda Basset MD ELECTRONICALLY SIGNED 02/12/2015 15:56

## 2015-03-14 ENCOUNTER — Encounter: Payer: Self-pay | Admitting: *Deleted

## 2015-04-15 ENCOUNTER — Other Ambulatory Visit: Payer: Self-pay

## 2015-04-15 DIAGNOSIS — F331 Major depressive disorder, recurrent, moderate: Secondary | ICD-10-CM | POA: Insufficient documentation

## 2015-04-16 ENCOUNTER — Encounter: Payer: Self-pay | Admitting: Psychiatry

## 2015-04-16 ENCOUNTER — Ambulatory Visit (INDEPENDENT_AMBULATORY_CARE_PROVIDER_SITE_OTHER): Payer: 59 | Admitting: Psychiatry

## 2015-04-16 ENCOUNTER — Other Ambulatory Visit: Payer: Self-pay

## 2015-04-16 VITALS — BP 124/78 | HR 60 | Temp 97.2°F | Ht 65.0 in

## 2015-04-16 DIAGNOSIS — I2510 Atherosclerotic heart disease of native coronary artery without angina pectoris: Secondary | ICD-10-CM | POA: Insufficient documentation

## 2015-04-16 DIAGNOSIS — F331 Major depressive disorder, recurrent, moderate: Secondary | ICD-10-CM | POA: Diagnosis not present

## 2015-04-16 DIAGNOSIS — E559 Vitamin D deficiency, unspecified: Secondary | ICD-10-CM | POA: Insufficient documentation

## 2015-04-16 DIAGNOSIS — I10 Essential (primary) hypertension: Secondary | ICD-10-CM | POA: Insufficient documentation

## 2015-04-16 DIAGNOSIS — J302 Other seasonal allergic rhinitis: Secondary | ICD-10-CM | POA: Insufficient documentation

## 2015-04-16 DIAGNOSIS — K5909 Other constipation: Secondary | ICD-10-CM | POA: Insufficient documentation

## 2015-04-16 DIAGNOSIS — M1990 Unspecified osteoarthritis, unspecified site: Secondary | ICD-10-CM | POA: Insufficient documentation

## 2015-04-16 MED ORDER — FLUOXETINE HCL 20 MG PO CAPS: 60.0000 mg | ORAL_CAPSULE | Freq: Every day | ORAL | Status: DC

## 2015-04-16 MED ORDER — TRAZODONE HCL 100 MG PO TABS: 100.0000 mg | ORAL_TABLET | Freq: Every evening | ORAL | Status: DC | PRN

## 2015-04-16 MED ORDER — LORAZEPAM 1 MG PO TABS: 1.0000 mg | ORAL_TABLET | Freq: Two times a day (BID) | ORAL | Status: DC

## 2015-04-16 NOTE — Progress Notes (Signed)
[facility] MD/PA/NP OP Progress Note  04/16/2015 10:31 AM Carly Marks  MRN:  [id number]  Subjective:  Patient returns for follow-up of her major depressive disorder, recurrent moderate. She states that she feels she is doing pretty good at this time. When asked what makes her saying that she states that she has been going out and being active with her daughters. She states she's been going out with them to run errands and go to the gym. She states she felt his activities help her mood. She states she is no longer remaining in bed as much as she has in the past.  We spent time discussing possible options for her. We discussed whether she fell she wanted to change any medications and she felt she would rather continue with the current regimen. I discussed that should she feel that her depression worsens we might explore augmenting her Prozac with either Wellbutrin or Abilify. She had a prior trial of Seroquel in March but felt that made her anxious and thus should the trial was discontinued fairly early.  He asked whether the current medications help her "palpitations." I told her that should palpitations be secondary to anxiety she was on medications to address that however should there be cardiac disease that the medications I prescribed would not treat that. Patient Carly Marks she is under the care of other doctors for cardiac assessment. Chief Complaint:  Visit Diagnosis:     ICD-9-CM ICD-10-CM   1. Major depressive disorder, recurrent episode, moderate 296.32 F33.1 LORazepam (ATIVAN) 1 MG tablet    Past Medical History:  Past Medical History  Diagnosis Date  . GERD (gastroesophageal reflux disease)   . Depression   . Anxiety   . Obstructive sleep apnea   . HTN (hypertension)   . Anemia   . HA (headache)   . Arthritis   . Major depressive disorder, recurrent episode, moderate   . Family history of colonic polyps   . CAD (coronary artery disease)     per patient    Past Surgical History   Procedure Laterality Date  . Tubal ligation     Family History:  Family History  Problem Relation Age of Onset  . Cervical cancer Mother   . Hypertension Mother   . Diabetes Maternal Aunt   . Diabetes Maternal Uncle   . Hypertension Sister   . Colon cancer Maternal Grandmother   . Hypertension Brother    Social History:  History   Social History  . Marital Status: Single    Spouse Name: N/A  . Number of Children: 3  . Years of Education: 10 [employer]   Social History Main Topics  . Smoking status: Never Smoker   . Smokeless tobacco: Never Used  . Alcohol Use: No  . Drug Use: No  . Sexual Activity: No   Other Topics Concern  . None   Social History Narrative   Additional History:   Assessment:   Musculoskeletal: Strength & Muscle Tone: within normal limits Gait & Station: normal Patient leans: N/A  Psychiatric Specialty Exam: HPI  Review of Systems  Psychiatric/Behavioral: Positive for depression (patient indicates some improvement in her  restless mood and anhedonia). Negative for suicidal ideas, hallucinations, memory loss and substance abuse. The patient has insomnia. The patient is not nervous/anxious.     Blood pressure 124/78, pulse 60, temperature 97.2 F (36.2 C), temperature source Tympanic, height 5\' 5"  (1.651 m), SpO2 98 %.There is no weight on file to calculate BMI.  General Appearance:  Well Groomed  Eye Contact:  Good  Speech:  Normal Rate  Volume:  Normal  Mood:  "Pretty good"  Affect:  Constricted  Thought Process:  Linear and Logical  Orientation:  Full (Time, Place, and Person)  Thought Content:  Negative  Suicidal Thoughts:  No  Homicidal Thoughts:  No  Memory:  Immediate;   Good Recent;   Good Remote;   Good  Judgement:  Good  Insight:  Good  Psychomotor Activity:  Normal  Concentration:  Good  Recall:  Good  Fund of Knowledge: Good  Language: Good  Akathisia:  Negative  Handed:  Right unknown  AIMS (if indicated):  Not done   Assets:  Desire for Improvement  ADL's:  Intact  Cognition: WNL  Sleep:  Fair, she relates she is able to fall asleep but sometimes has early morning awakening. I did discuss with her her use of trazodone. She states she's only been taking 100 mg at night. I informed her that her instructions are that she can take 150 mg at night.    Is the patient at risk to self?  No. Has the patient been a risk to self in the past 6 months?  No. Has the patient been a risk to self within the distant past?  No. Is the patient a risk to others?  No. Has the patient been a risk to others in the past 6 months?  No. Has the patient been a risk to others within the distant past?  No.  Current Medications: Current Outpatient Prescriptions  Medication Sig Dispense Refill  . acetaminophen (TYLENOL) 500 MG tablet Take by mouth.    Marland Kitchen albuterol (PROVENTIL HFA;VENTOLIN HFA) 108 (90 BASE) MCG/ACT inhaler Inhale 2 puffs into the lungs every 6 (six) hours as needed for wheezing.    Marland Kitchen amLODipine-benazepril (LOTREL) 5-10 MG per capsule Take 5-10 capsules by mouth daily.    Marland Kitchen aspirin 81 MG tablet Take 81 mg by mouth daily.    Marland Kitchen atorvastatin (LIPITOR) 40 MG tablet     . cholecalciferol (VITAMIN D) 400 UNITS TABS tablet Take by mouth.    . docusate sodium (COLACE) 100 MG capsule Take by mouth.    Marland Kitchen FLUoxetine (PROZAC) 20 MG capsule Take 20 mg by mouth daily.    Marland Kitchen FLUoxetine (PROZAC) 40 MG capsule     . fluticasone (FLONASE) 50 MCG/ACT nasal spray Place 2 sprays into the nose daily.    Marland Kitchen LORazepam (ATIVAN) 1 MG tablet Take 1 tablet (1 mg total) by mouth 2 (two) times daily. 30 tablet 2  . metoprolol tartrate (LOPRESSOR) 25 MG tablet     . omeprazole (PRILOSEC) 20 MG capsule Take 20 mg by mouth daily.    Marland Kitchen oxybutynin (DITROPAN) 5 MG tablet Take 5 mg by mouth daily.    . traZODone (DESYREL) 100 MG tablet     . ciprofloxacin (CIPRO) 500 MG tablet Take 500 mg by mouth 2 (two) times daily. For 7 days    . clonazePAM  (KLONOPIN) 0.5 MG tablet Take 0.5 mg by mouth daily.    . clopidogrel (PLAVIX) 75 MG tablet     . famotidine (PEPCID) 20 MG tablet Take 20 mg by mouth daily.    . isosorbide dinitrate (ISORDIL) 30 MG tablet Take by mouth.    . isosorbide mononitrate (IMDUR) 30 MG 24 hr tablet     . LORazepam (ATIVAN) 0.5 MG tablet Take 0.5 mg by mouth 2 (two) times daily as needed for anxiety.    Marland Kitchen  meloxicam (MOBIC) 15 MG tablet Take 15 mg by mouth daily.    . metoprolol succinate (TOPROL-XL) 25 MG 24 hr tablet Take by mouth.    Marland Kitchen PATANOL 0.1 % ophthalmic solution Place 0.1 drops into both eyes daily.    Marland Kitchen PEG 3350-KCl-NaBcb-NaCl-NaSulf (PEG-3350/ELECTROLYTES) 236 G SOLR     . QUEtiapine (SEROQUEL) 50 MG tablet Take by mouth.    . ranitidine (ZANTAC) 75 MG tablet Take 75 mg by mouth 2 (two) times daily.    . traZODone (DESYREL) 150 MG tablet Take 150 mg by mouth daily.    . traZODone (DESYREL) 50 MG tablet     . zolpidem (AMBIEN) 5 MG tablet Take by mouth.    . zoster vaccine live, PF, (ZOSTAVAX) 01751 UNT/0.65ML injection Inject 0.65 mLs into the skin once.     No current facility-administered medications for this visit.    Medical Decision Making:  Established Problem, Stable/Improving (1)  Treatment Plan Summary:Medication management We're going to continue her medications without any changes. Thus she'll continue trazodone 100 mg tablets, take 1 or 1-1/2 at night as needed for insomnia. She'll continue her lorazepam 1 mg twice daily and are Prozac 60 mg daily. She will follow up in 6 weeks and we can reassess for any need for changes.  Faith Rogue 04/16/2015, 10:31 AM

## 2015-04-22 ENCOUNTER — Ambulatory Visit: Payer: Medicare Other | Admitting: Anesthesiology

## 2015-04-22 ENCOUNTER — Ambulatory Visit
Admission: RE | Admit: 2015-04-22 | Discharge: 2015-04-22 | Disposition: A | Discharge status: Home / Self Care | Payer: Medicare Other | Source: Ambulatory Visit | Attending: Gastroenterology | Admitting: Gastroenterology

## 2015-04-22 ENCOUNTER — Encounter
Admission: RE | Disposition: A | Discharge status: Home / Self Care | Payer: Self-pay | Source: Ambulatory Visit | Attending: Gastroenterology

## 2015-04-22 DIAGNOSIS — Z1211 Encounter for screening for malignant neoplasm of colon: Secondary | ICD-10-CM | POA: Diagnosis not present

## 2015-04-22 DIAGNOSIS — Z880 Allergy status to penicillin: Secondary | ICD-10-CM | POA: Insufficient documentation

## 2015-04-22 DIAGNOSIS — F419 Anxiety disorder, unspecified: Secondary | ICD-10-CM | POA: Insufficient documentation

## 2015-04-22 DIAGNOSIS — K219 Gastro-esophageal reflux disease without esophagitis: Secondary | ICD-10-CM | POA: Diagnosis not present

## 2015-04-22 DIAGNOSIS — J45909 Unspecified asthma, uncomplicated: Secondary | ICD-10-CM | POA: Insufficient documentation

## 2015-04-22 DIAGNOSIS — Z8371 Family history of colonic polyps: Secondary | ICD-10-CM | POA: Insufficient documentation

## 2015-04-22 DIAGNOSIS — I10 Essential (primary) hypertension: Secondary | ICD-10-CM | POA: Insufficient documentation

## 2015-04-22 DIAGNOSIS — I2510 Atherosclerotic heart disease of native coronary artery without angina pectoris: Secondary | ICD-10-CM | POA: Insufficient documentation

## 2015-04-22 DIAGNOSIS — D125 Benign neoplasm of sigmoid colon: Secondary | ICD-10-CM | POA: Diagnosis not present

## 2015-04-22 DIAGNOSIS — F329 Major depressive disorder, single episode, unspecified: Secondary | ICD-10-CM | POA: Diagnosis not present

## 2015-04-22 DIAGNOSIS — G4733 Obstructive sleep apnea (adult) (pediatric): Secondary | ICD-10-CM | POA: Insufficient documentation

## 2015-04-22 DIAGNOSIS — D649 Anemia, unspecified: Secondary | ICD-10-CM | POA: Insufficient documentation

## 2015-04-22 DIAGNOSIS — R51 Headache: Secondary | ICD-10-CM | POA: Diagnosis not present

## 2015-04-22 DIAGNOSIS — Z885 Allergy status to narcotic agent status: Secondary | ICD-10-CM | POA: Insufficient documentation

## 2015-04-22 DIAGNOSIS — Z79899 Other long term (current) drug therapy: Secondary | ICD-10-CM | POA: Diagnosis not present

## 2015-04-22 HISTORY — PX: COLONOSCOPY: SHX5424

## 2015-04-22 HISTORY — DX: Family history of colonic polyps: Z83.71

## 2015-04-22 HISTORY — DX: Family history of colon polyps, unspecified: Z83.719

## 2015-04-22 SURGERY — COLONOSCOPY
Anesthesia: General

## 2015-04-22 MED ORDER — LIDOCAINE HCL (CARDIAC) 20 MG/ML IV SOLN
INTRAVENOUS | Status: DC | PRN
  Administered 2015-04-22: 80 mg via INTRAVENOUS

## 2015-04-22 MED ORDER — SODIUM CHLORIDE 0.9 % IV SOLN
INTRAVENOUS | Status: DC
  Administered 2015-04-22: 1000 mL via INTRAVENOUS

## 2015-04-22 MED ORDER — PROPOFOL INFUSION 10 MG/ML OPTIME
INTRAVENOUS | Status: DC | PRN
  Administered 2015-04-22: 140 ug/kg/min via INTRAVENOUS

## 2015-04-22 MED ORDER — SODIUM CHLORIDE 0.9 % IV SOLN: INTRAVENOUS | Status: DC

## 2015-04-22 MED ORDER — MIDAZOLAM HCL 2 MG/2ML IJ SOLN
INTRAMUSCULAR | Status: DC | PRN
  Administered 2015-04-22: 2 mg via INTRAVENOUS

## 2015-04-22 NOTE — Anesthesia Postprocedure Evaluation (Signed)
  Anesthesia Post-op Note  Patient: Carly Marks  Procedure(s) Performed: Procedure(s): COLONOSCOPY (N/A)  Anesthesia type:General  Patient location: PACU  Post pain: Pain level controlled  Post assessment: Post-op Vital signs reviewed, Patient's Cardiovascular Status Stable, Respiratory Function Stable, Patent Airway and No signs of Nausea or vomiting  Post vital signs: Reviewed and stable  Last Vitals:  Filed Vitals:   04/22/15 0940  BP: 150/69  Pulse: 50  Temp:   Resp: 14    Level of consciousness: awake, alert  and patient cooperative  Complications: No apparent anesthesia complications

## 2015-04-22 NOTE — Transfer of Care (Signed)
Immediate Anesthesia Transfer of Care Note  Patient: Carly Marks  Procedure(s) Performed: Procedure(s): COLONOSCOPY (N/A)  Patient Location: endo  Anesthesia Type:General  Level of Consciousness: awake, alert  and oriented  Airway & Oxygen Therapy: Patient Spontanous Breathing and Patient connected to nasal cannula oxygen  Post-op Assessment: Report given to RN and Post -op Vital signs reviewed and stable  Post vital signs: Reviewed and stable  Last Vitals:  Filed Vitals:   04/22/15 0809  BP: 130/58  Pulse: 54  Temp: 36.6 C  Resp: 17    Complications: No apparent anesthesia complications

## 2015-04-22 NOTE — Op Note (Signed)
[facility] Patient Name: Carly Marks Procedure Date: 04/22/2015 8:33 AM MRN: [id number] Account #: [id number] Date of Birth: 04-12-1956 Admit Type: Outpatient Age: 59 Room: [facility] ENDO ROOM 4 Gender: Female Note Status: Finalized Procedure:         Colonoscopy Indications:       Family history of colonic polyps in a first-degree relative Providers:         Lupita Dawn. Candace Cruise, MD Referring MD:      Ellin Goodie, MD (Referring MD) Medicines:         Monitored Anesthesia Care Complications:     No immediate complications. Procedure:         Pre-Anesthesia Assessment:                    - Prior to the procedure, a History and Physical was                     performed, and patient medications, allergies and                     sensitivities were reviewed. The patient's tolerance of                     previous anesthesia was reviewed.                    - The risks and benefits of the procedure and the sedation                     options and risks were discussed with the patient. All                     questions were answered and informed consent was obtained.                    - After reviewing the risks and benefits, the patient was                     deemed in satisfactory condition to undergo the procedure.                    After obtaining informed consent, the colonoscope was                     passed under direct vision. Throughout the procedure, the                     patient's blood pressure, pulse, and oxygen saturations                     were monitored continuously. The Colonoscope was                     introduced through the anus and advanced to the the cecum,                     identified by appendiceal orifice and ileocecal valve. The                     colonoscopy was performed without difficulty. The patient                     tolerated the procedure well. The quality of the bowel  preparation was  good. Findings:      A small polyp was found in the sigmoid colon. The polyp was sessile. The       polyp was removed with a cold snare and jumbo biopsy forceps. Resection       and retrieval were complete.      The exam was otherwise without abnormality. Impression:        - One small polyp in the sigmoid colon. Resected and                     retrieved.                    - The examination was otherwise normal. Recommendation:    - Discharge patient to home.                    - Repeat colonoscopy in 5 years for surveillance.                    - The findings and recommendations were discussed with the                     patient.                    - Await pathology results. Procedure Code(s): --- Professional ---                    [id number], Colonoscopy, flexible; with removal of tumor(s),                     polyp(s), or other lesion(s) by snare technique Diagnosis Code(s): --- Professional ---                    D12.5, Benign neoplasm of sigmoid colon                    Z83.71, Family history of colonic polyps CPT copyright 2014 [employer] Association. All rights reserved. The codes documented in this report are preliminary and upon coder review may  be revised to meet current compliance requirements. Hulen Luster, MD 04/22/2015 9:05:28 AM This report has been signed electronically. Number of Addenda: 0 Note Initiated On: 04/22/2015 8:33 AM Scope Withdrawal Time: 0 hours 9 minutes 10 seconds  Total Procedure Duration: 0 hours 15 minutes 34 seconds       [facility]

## 2015-04-22 NOTE — H&P (Signed)
Primary Care Physician:  Ellamae Sia, MD Primary Gastroenterologist:  Dr. Candace Cruise  Pre-Procedure History & Physical: HPI:  Carly Marks is a 59 y.o. female is here for an colonoscopy.   Past Medical History  Diagnosis Date  . GERD (gastroesophageal reflux disease)   . Depression   . Anxiety   . Obstructive sleep apnea   . HTN (hypertension)   . Anemia   . HA (headache)   . Arthritis   . Major depressive disorder, recurrent episode, moderate   . Family history of colonic polyps   . CAD (coronary artery disease)     per patient    Past Surgical History  Procedure Laterality Date  . Tubal ligation      Prior to Admission medications   Medication Sig Start Date End Date Taking? Authorizing Provider  amLODipine-benazepril (LOTREL) 5-10 MG per capsule Take 5-10 capsules by mouth daily. 08/17/13  Yes Historical Provider, MD  ciprofloxacin (CIPRO) 500 MG tablet Take 500 mg by mouth 2 (two) times daily. For 7 days   Yes Historical Provider, MD  isosorbide dinitrate (ISORDIL) 30 MG tablet Take by mouth.   Yes Historical Provider, MD  isosorbide mononitrate (IMDUR) 30 MG 24 hr tablet  02/05/15  Yes Historical Provider, MD  LORazepam (ATIVAN) 0.5 MG tablet Take 0.5 mg by mouth 2 (two) times daily as needed for anxiety.   Yes Historical Provider, MD  metoprolol succinate (TOPROL-XL) 25 MG 24 hr tablet Take by mouth.   Yes Historical Provider, MD  metoprolol tartrate (LOPRESSOR) 25 MG tablet  04/01/15  Yes Historical Provider, MD  acetaminophen (TYLENOL) 500 MG tablet Take by mouth.    Historical Provider, MD  albuterol (PROVENTIL HFA;VENTOLIN HFA) 108 (90 BASE) MCG/ACT inhaler Inhale 2 puffs into the lungs every 6 (six) hours as needed for wheezing.    Historical Provider, MD  aspirin 81 MG tablet Take 81 mg by mouth daily.    Historical Provider, MD  atorvastatin (LIPITOR) 40 MG tablet  04/01/15   Historical Provider, MD  cholecalciferol (VITAMIN D) 400 UNITS TABS tablet Take by  mouth.    Historical Provider, MD  clonazePAM (KLONOPIN) 0.5 MG tablet Take 0.5 mg by mouth daily. 07/19/13   Historical Provider, MD  clopidogrel (PLAVIX) 75 MG tablet  02/05/15   Historical Provider, MD  docusate sodium (COLACE) 100 MG capsule Take by mouth.    Historical Provider, MD  famotidine (PEPCID) 20 MG tablet Take 20 mg by mouth daily. 07/25/13   Historical Provider, MD  FLUoxetine (PROZAC) 20 MG capsule Take 3 capsules (60 mg total) by mouth daily. 04/16/15   Marjie Skiff, MD  FLUoxetine (PROZAC) 40 MG capsule  02/26/15   Historical Provider, MD  fluticasone (FLONASE) 50 MCG/ACT nasal spray Place 2 sprays into the nose daily.    Historical Provider, MD  LORazepam (ATIVAN) 1 MG tablet Take 1 tablet (1 mg total) by mouth 2 (two) times daily. 04/16/15   Marjie Skiff, MD  meloxicam (MOBIC) 15 MG tablet Take 15 mg by mouth daily. 07/31/13   Historical Provider, MD  omeprazole (PRILOSEC) 20 MG capsule Take 20 mg by mouth daily.    Historical Provider, MD  oxybutynin (DITROPAN) 5 MG tablet Take 5 mg by mouth daily.    Historical Provider, MD  PATANOL 0.1 % ophthalmic solution Place 0.1 drops into both eyes daily. 05/19/13   Historical Provider, MD  PEG 3350-KCl-NaBcb-NaCl-NaSulf (PEG-3350/ELECTROLYTES) 236 G SOLR  02/14/15   Historical  Provider, MD  QUEtiapine (SEROQUEL) 50 MG tablet Take by mouth. 01/29/15   Historical Provider, MD  ranitidine (ZANTAC) 75 MG tablet Take 75 mg by mouth 2 (two) times daily.    Historical Provider, MD  traZODone (DESYREL) 100 MG tablet Take 1 tablet (100 mg total) by mouth at bedtime as needed for sleep. Take 1 or 1-1/2 tablets at bedtime as needed for insomnia 04/16/15   Marjie Skiff, MD  traZODone (DESYREL) 150 MG tablet Take 150 mg by mouth daily. 07/19/13   Historical Provider, MD  traZODone (DESYREL) 50 MG tablet  02/22/15   Historical Provider, MD  zolpidem (AMBIEN) 5 MG tablet Take by mouth.    Historical Provider, MD  zoster vaccine live, PF, (ZOSTAVAX)  19400 UNT/0.65ML injection Inject 0.65 mLs into the skin once.    Historical Provider, MD    Allergies as of 03/01/2015 - Review Complete 08/23/2013  Allergen Reaction Noted  . Codeine  08/22/2013  . Dextroamphetamine  08/22/2013  . Nsaids  08/22/2013    Family History  Problem Relation Age of Onset  . Cervical cancer Mother   . Hypertension Mother   . Diabetes Maternal Aunt   . Diabetes Maternal Uncle   . Hypertension Sister   . Colon cancer Maternal Grandmother   . Hypertension Brother     History   Social History  . Marital Status: Single    Spouse Name: N/A  . Number of Children: 3  . Years of Education: 10 [name]   Occupational History  . Not on file.   Social History Main Topics  . Smoking status: Never Smoker   . Smokeless tobacco: Never Used  . Alcohol Use: No  . Drug Use: No  . Sexual Activity: No   Other Topics Concern  . Not on file   Social History Narrative    Review of Systems: See HPI, otherwise negative ROS  Physical Exam: BP 130/58 mmHg  Pulse 54  Temp(Src) 97.8 F (36.6 C) (Tympanic)  Resp 17  Ht 5\' 5"  (1.651 m)  Wt 111.131 kg (245 lb)  BMI 40.77 kg/m2  SpO2 98% General:   Alert,  pleasant and cooperative in NAD Head:  Normocephalic and atraumatic. Neck:  Supple; no masses or thyromegaly. Lungs:  Clear throughout to auscultation.    Heart:  Regular rate and rhythm. Abdomen:  Soft, nontender and nondistended. Normal bowel sounds, without guarding, and without rebound.   Neurologic:  Alert and  oriented x4;  grossly normal neurologically.  Impression/Plan: Carly Marks is here for a colonoscopy to be performed for family hx of colon polyps.  Risks, benefits, limitations, and alternatives regarding colonoscopy have been reviewed with the patient.  Questions have been answered.  All parties agreeable.   Nhan Qualley, Lupita Dawn, MD  04/22/2015, 8:33 AM

## 2015-04-22 NOTE — Anesthesia Preprocedure Evaluation (Signed)
Anesthesia Evaluation  Patient identified by MRN, date of birth, ID band Patient awake    Reviewed: Allergy & Precautions, H&P , NPO status , Patient's Chart, lab work & pertinent test results, reviewed documented beta blocker date and time   Airway Mallampati: III  TM Distance: >3 FB Neck ROM: full    Dental no notable dental hx. (+) Teeth Intact   Pulmonary neg pulmonary ROS, asthma , sleep apnea ,  breath sounds clear to auscultation  Pulmonary exam normal       Cardiovascular Exercise Tolerance: Good hypertension, + CAD negative cardio ROS  Rhythm:regular Rate:Normal     Neuro/Psych  Headaches, PSYCHIATRIC DISORDERS negative neurological ROS  negative psych ROS   GI/Hepatic negative GI ROS, Neg liver ROS, GERD-  ,  Endo/Other  negative endocrine ROS  Renal/GU negative Renal ROS  negative genitourinary   Musculoskeletal   Abdominal   Peds  Hematology negative hematology ROS (+) anemia ,   Anesthesia Other Findings   Reproductive/Obstetrics negative OB ROS                             Anesthesia Physical Anesthesia Plan  ASA: III  Anesthesia Plan: General   Post-op Pain Management:    Induction:   Airway Management Planned:   Additional Equipment:   Intra-op Plan:   Post-operative Plan:   Informed Consent: I have reviewed the patients History and Physical, chart, labs and discussed the procedure including the risks, benefits and alternatives for the proposed anesthesia with the patient or authorized representative who has indicated his/her understanding and acceptance.   Dental Advisory Given  Plan Discussed with: CRNA  Anesthesia Plan Comments:         Anesthesia Quick Evaluation

## 2015-04-23 LAB — SURGICAL PATHOLOGY

## 2015-05-27 ENCOUNTER — Other Ambulatory Visit: Payer: Self-pay | Admitting: Nurse Practitioner

## 2015-05-27 DIAGNOSIS — Z1231 Encounter for screening mammogram for malignant neoplasm of breast: Secondary | ICD-10-CM

## 2015-05-28 ENCOUNTER — Ambulatory Visit (INDEPENDENT_AMBULATORY_CARE_PROVIDER_SITE_OTHER): Payer: 59 | Admitting: Psychiatry

## 2015-05-28 ENCOUNTER — Encounter: Payer: Self-pay | Admitting: Psychiatry

## 2015-05-28 ENCOUNTER — Other Ambulatory Visit: Payer: Self-pay

## 2015-05-28 VITALS — BP 122/84 | HR 52 | Temp 97.0°F | Ht 65.0 in | Wt 248.2 lb

## 2015-05-28 DIAGNOSIS — F331 Major depressive disorder, recurrent, moderate: Secondary | ICD-10-CM

## 2015-05-28 MED ORDER — LORAZEPAM 1 MG PO TABS: 1.0000 mg | ORAL_TABLET | Freq: Two times a day (BID) | ORAL | Status: DC

## 2015-05-28 NOTE — Progress Notes (Signed)
[place] MD/PA/NP OP Progress Note  05/28/2015 10:13 AM Carly Marks  MRN:  [id number]  Subjective:  Patient returns for follow-up of her major depressive disorder, recurrent moderate. Patient states her main complaint today is she is not sleeping. We reviewed sleep hygiene and patient did state she'll take naps during the day. When I asked her how long or her naps she stated sometimes there until her daughter comes home, however she was not able to quantify specific amounts but implied day or several hours in length. I did discuss with her that at one point she said the trazodone was helping her and now she states that it is no longer helping her and she wakes up. We discussed sleep hygiene and the impact of the daytime naps on nighttime sleep. Patient indicated with this information and the fact that she may go ahead and pursue taking some adult day classes she would like to reassess whether there needs to be any change in her sleep medication.  He states that she occasionally has depression but clarifies that her depression is sometimes related to something that goes on socially. For example she states she thinks about the fact she wishes she was still able to work and then she can become depressed. However I asked her how long she'll stay depressed when she thinks of these things and she could not specify any particular amount of time from hours to days but did seem to indicate that it would very drastically from several hours to a few days. He indicates she still able to get out and do things with her daughters. She stated she didn't enjoy a beach trip with her daughter recently.  Dates that she is concerned about weight gain and feels like when she gets depressed she eats. I discussed with her perhaps adding a thyroid test to assess for any contribution to low energy and sleep issues. I've given her a laboratory slip for this purpose. Chief Complaint:  Visit Diagnosis:     ICD-9-CM ICD-10-CM   1.  Major depressive disorder, recurrent episode, moderate 296.32 F33.1 LORazepam (ATIVAN) 1 MG tablet    Past Medical History:  Past Medical History  Diagnosis Date  . GERD (gastroesophageal reflux disease)   . Depression   . Anxiety   . Obstructive sleep apnea   . HTN (hypertension)   . Anemia   . HA (headache)   . Arthritis   . Major depressive disorder, recurrent episode, moderate   . Family history of colonic polyps   . CAD (coronary artery disease)     per patient    Past Surgical History  Procedure Laterality Date  . Tubal ligation     Family History:  Family History  Problem Relation Age of Onset  . Cervical cancer Mother   . Hypertension Mother   . Diabetes Maternal Aunt   . Diabetes Maternal Uncle   . Hypertension Sister   . Colon cancer Maternal Grandmother   . Hypertension Brother    Social History:  History   Social History  . Marital Status: Single    Spouse Name: N/A  . Number of Children: 3  . Years of Education: 10 TH   Social History Main Topics  . Smoking status: Never Smoker   . Smokeless tobacco: Never Used  . Alcohol Use: No  . Drug Use: No  . Sexual Activity: No   Other Topics Concern  . None   Social History Narrative   Additional History:  Assessment:   Musculoskeletal: Strength & Muscle Tone: within normal limits Gait & Station: normal Patient leans: N/A  Psychiatric Specialty Exam: HPI  Review of Systems  Psychiatric/Behavioral: Positive for depression (today discusses that it's more related to social situation and thoughts that she has about her life). Negative for suicidal ideas, hallucinations, memory loss and substance abuse. The patient has insomnia. The patient is not nervous/anxious.     Blood pressure 122/84, pulse 52, temperature 97 F (36.1 C), temperature source Tympanic, height 5\' 5"  (1.651 m), weight 248 lb 3.2 oz (112.583 kg), SpO2 97 %.Body mass index is 41.3 kg/(m^2).  General Appearance: Well Groomed  Eye  Contact:  Good  Speech:  Normal Rate  Volume:  Normal  Mood:  okay  Affect:  Constricted  Thought Process:  Linear and Logical  Orientation:  Full (Time, Place, and Person)  Thought Content:  Negative  Suicidal Thoughts:  No  Homicidal Thoughts:  No  Memory:  Immediate;   Good Recent;   Good Remote;   Good  Judgement:  Good  Insight:  Good  Psychomotor Activity:  Normal  Concentration:  Good  Recall:  Good  Fund of Knowledge: Good  Language: Good  Akathisia:  Negative  Handed:  Right unknown  AIMS (if indicated):  Not done  Assets:  Desire for Improvement  ADL's:  Intact  Cognition: WNL  Sleep:  Continues to be fair she estimates she sleeps about 4 hours a night    Is the patient at risk to self?  No. Has the patient been a risk to self in the past 6 months?  No. Has the patient been a risk to self within the distant past?  No. Is the patient a risk to others?  No. Has the patient been a risk to others in the past 6 months?  No. Has the patient been a risk to others within the distant past?  No.  Current Medications: Current Outpatient Prescriptions  Medication Sig Dispense Refill  . acetaminophen (TYLENOL) 500 MG tablet Take by mouth.    Marland Kitchen albuterol (PROVENTIL HFA;VENTOLIN HFA) 108 (90 BASE) MCG/ACT inhaler Inhale 2 puffs into the lungs every 6 (six) hours as needed for wheezing.    Marland Kitchen amLODipine-benazepril (LOTREL) 5-10 MG per capsule Take 5-10 capsules by mouth daily.    Marland Kitchen aspirin 81 MG tablet Take 81 mg by mouth daily.    Marland Kitchen atorvastatin (LIPITOR) 40 MG tablet     . cholecalciferol (VITAMIN D) 400 UNITS TABS tablet Take by mouth.    . clonazePAM (KLONOPIN) 0.5 MG tablet Take 0.5 mg by mouth daily.    . clopidogrel (PLAVIX) 75 MG tablet     . docusate sodium (COLACE) 100 MG capsule Take by mouth.    . famotidine (PEPCID) 20 MG tablet Take 20 mg by mouth daily.    Marland Kitchen FLUoxetine (PROZAC) 40 MG capsule     . fluticasone (FLONASE) 50 MCG/ACT nasal spray Place 2 sprays  into the nose daily.    . isosorbide dinitrate (ISORDIL) 30 MG tablet Take by mouth.    Marland Kitchen LORazepam (ATIVAN) 1 MG tablet Take 1 tablet (1 mg total) by mouth 2 (two) times daily. 60 tablet 1  . meloxicam (MOBIC) 15 MG tablet Take 15 mg by mouth daily.    . metoprolol tartrate (LOPRESSOR) 25 MG tablet     . omeprazole (PRILOSEC) 20 MG capsule Take 20 mg by mouth daily.    Marland Kitchen oxybutynin (DITROPAN) 5 MG tablet Take  5 mg by mouth daily.    Marland Kitchen PATANOL 0.1 % ophthalmic solution Place 0.1 drops into both eyes daily.    Marland Kitchen PEG 3350-KCl-NaBcb-NaCl-NaSulf (PEG-3350/ELECTROLYTES) 236 G SOLR     . traZODone (DESYREL) 100 MG tablet Take 1 tablet (100 mg total) by mouth at bedtime as needed for sleep. Take 1 or 1-1/2 tablets at bedtime as needed for insomnia 45 tablet 3  . traZODone (DESYREL) 50 MG tablet     . zoster vaccine live, PF, (ZOSTAVAX) 96222 UNT/0.65ML injection Inject 0.65 mLs into the skin once.    . ciprofloxacin (CIPRO) 500 MG tablet Take 500 mg by mouth 2 (two) times daily. For 7 days    . FLUoxetine (PROZAC) 20 MG capsule Take 3 capsules (60 mg total) by mouth daily. (Patient not taking: Reported on 05/28/2015) 90 capsule 3  . isosorbide mononitrate (IMDUR) 30 MG 24 hr tablet     . LORazepam (ATIVAN) 0.5 MG tablet Take 0.5 mg by mouth 2 (two) times daily as needed for anxiety.    . metoprolol succinate (TOPROL-XL) 25 MG 24 hr tablet Take by mouth.    . Nystatin ([place]) 100000 UNIT/GM POWD     . QUEtiapine (SEROQUEL) 50 MG tablet Take by mouth.    . ranitidine (ZANTAC) 75 MG tablet Take 75 mg by mouth 2 (two) times daily.    . traZODone (DESYREL) 150 MG tablet Take 150 mg by mouth daily.    Marland Kitchen zolpidem (AMBIEN) 5 MG tablet Take by mouth.     No current facility-administered medications for this visit.    Medical Decision Making:  Established Problem, Stable/Improving (1)  Treatment Plan Summary:Medication management We're going to continue her medications without any changes. Thus she'll  continue trazodone 100 mg tablets, take 1 or 1-1/2 at night as needed for insomnia. She'll continue her lorazepam 1 mg twice daily and are Prozac 60 mg daily. She will follow up in 2 months and we can reassess for any need for changes.  She should have enough of all of her medications until her appointment in 2 months. However the lorazepam she may be sure by a week or 2 and thus I provided her with a addition for a 30 day supply with 1 refill today.  I have given her a laboratory slip to assess for TSH.  Faith Rogue 05/28/2015, 10:13 AM

## 2015-06-12 ENCOUNTER — Ambulatory Visit
Admission: RE | Admit: 2015-06-12 | Discharge: 2015-06-12 | Disposition: A | Discharge status: Home / Self Care | Payer: Medicare Other | Source: Ambulatory Visit | Attending: Nurse Practitioner | Admitting: Nurse Practitioner

## 2015-06-12 DIAGNOSIS — Z1231 Encounter for screening mammogram for malignant neoplasm of breast: Secondary | ICD-10-CM | POA: Insufficient documentation

## 2015-06-13 ENCOUNTER — Encounter: Payer: Self-pay | Admitting: Gastroenterology

## 2015-06-24 IMAGING — CR DG CHEST 2V
1 series · 2 of 2 positions shown · non-contrast
Comparison: 09/06/2012

CLINICAL DATA: Behavioral medicine placement, CHF

EXAM:
CHEST  2 VIEW

[Series 1: w chest pa · 0.14mm/px · 2 of 2 slices shown]
[im 1/2]
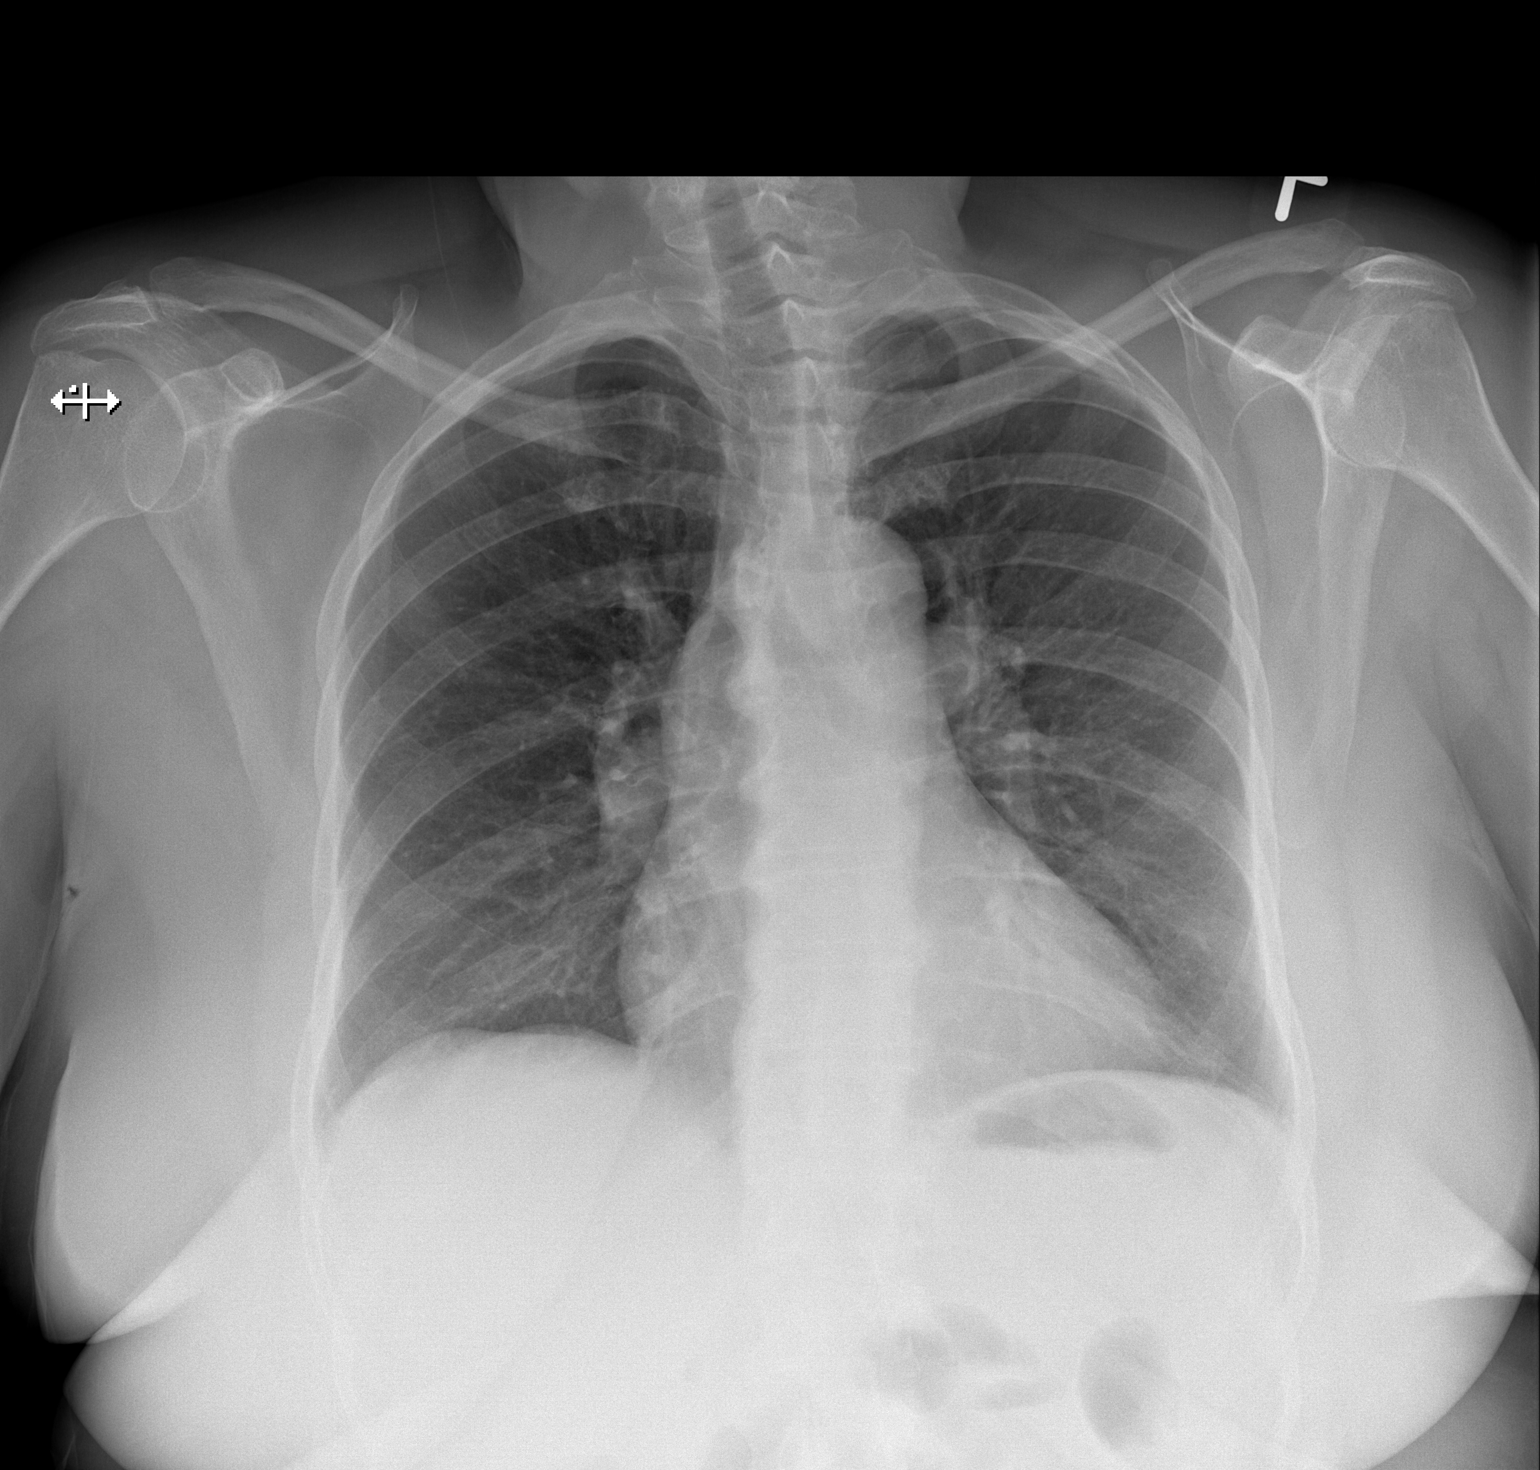
[im 2/2]
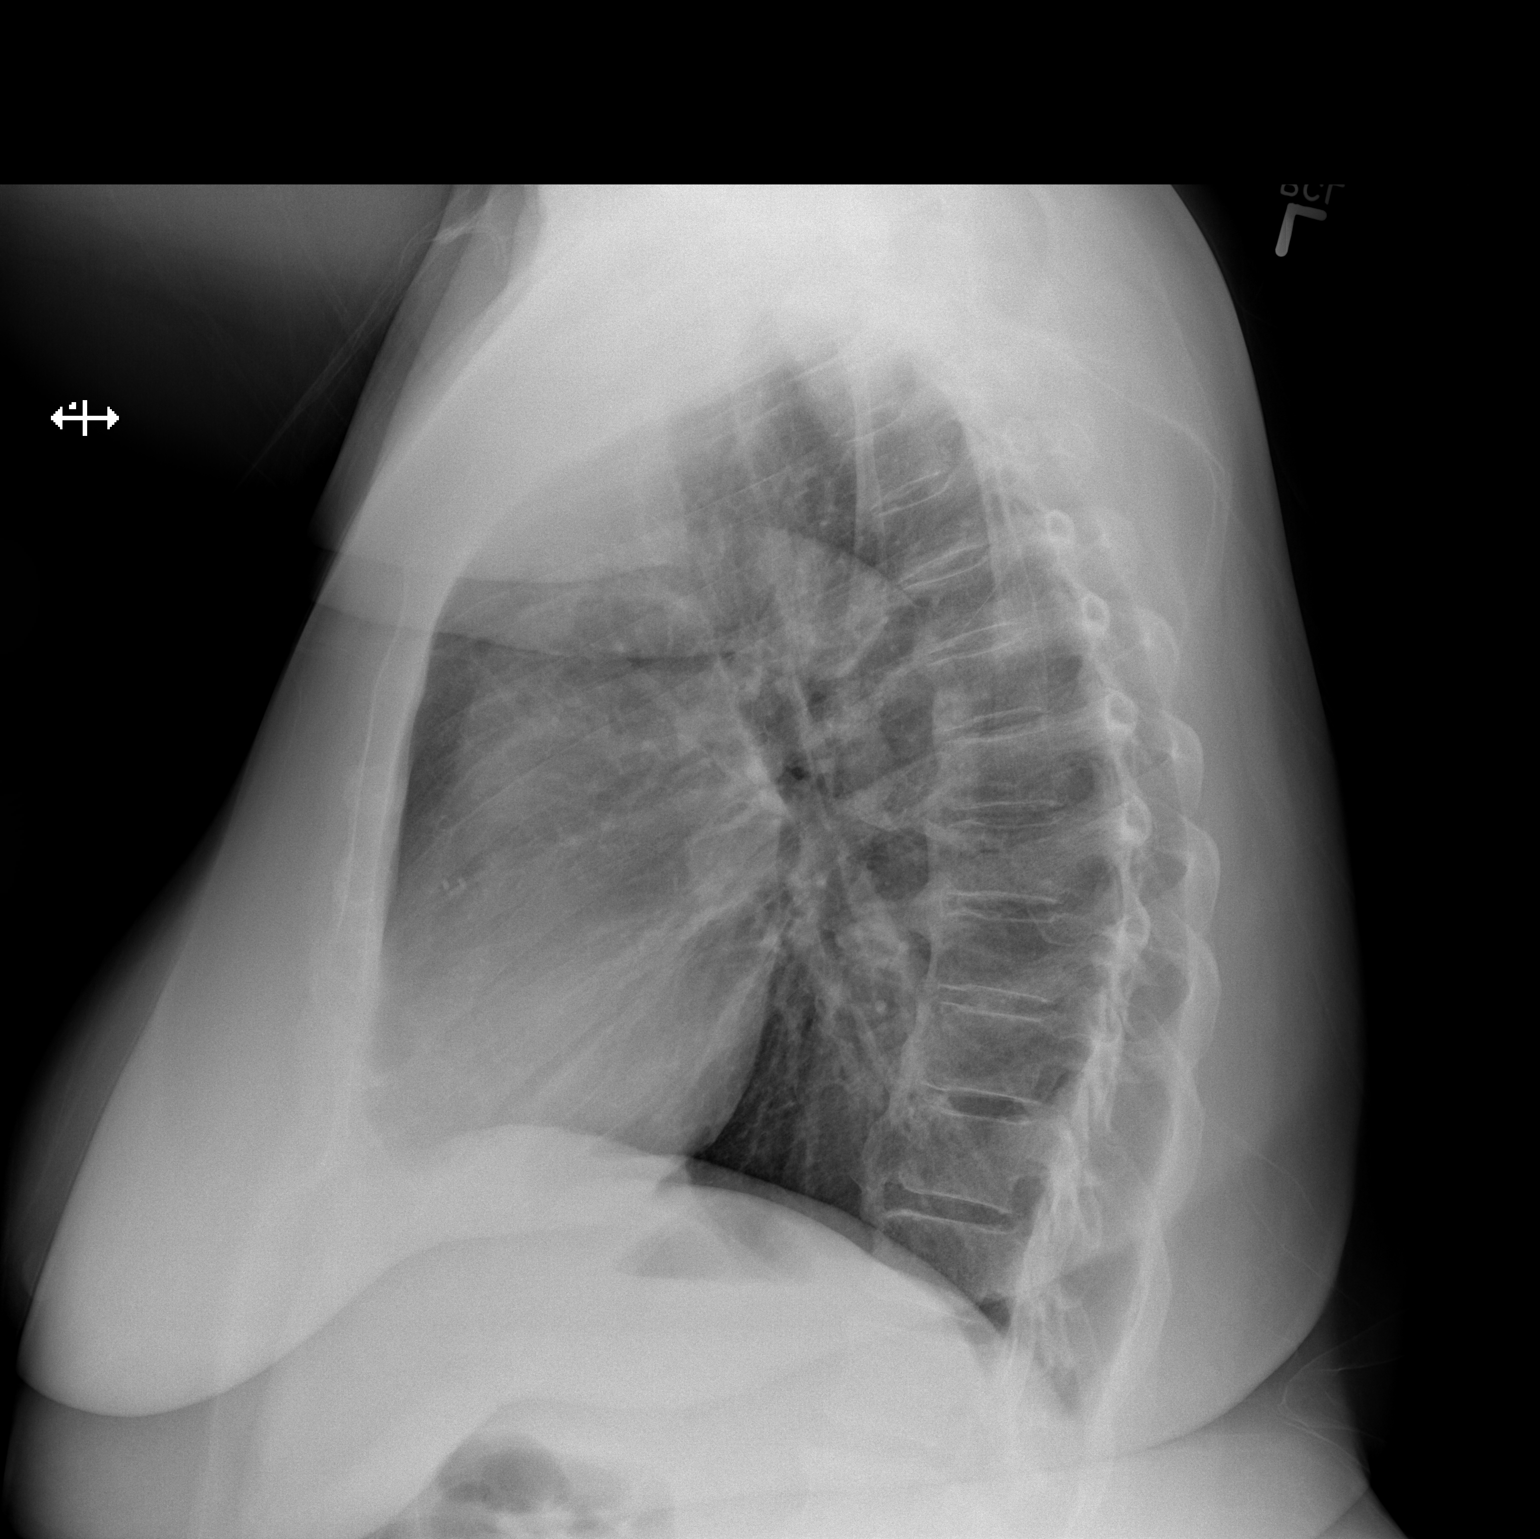

[2 of 2 positions shown; findings below may reference images not displayed]

FINDINGS: The heart size and mediastinal contours are within normal limits.
Both lungs are clear. The visualized skeletal structures are
unremarkable. Degenerative changes of the thoracic spine. Stable
exam.
IMPRESSION: No active cardiopulmonary disease.

## 2015-07-08 ENCOUNTER — Other Ambulatory Visit: Payer: Self-pay | Admitting: Psychiatry

## 2015-07-09 MED ORDER — LORAZEPAM 1 MG PO TABS: 1.0000 mg | ORAL_TABLET | Freq: Two times a day (BID) | ORAL | Status: DC

## 2015-07-09 NOTE — Telephone Encounter (Signed)
faxed rx to the pharmacy- confirmed

## 2015-07-22 ENCOUNTER — Other Ambulatory Visit: Payer: Self-pay

## 2015-07-22 NOTE — Telephone Encounter (Signed)
received a request for a refill on fluoxetine 20mg  take 3 capsules by mouth once daily.  Pt last seen on 05-28-15 next appt 07-29-15

## 2015-07-23 MED ORDER — FLUOXETINE HCL 20 MG PO CAPS: 60.0000 mg | ORAL_CAPSULE | Freq: Every day | ORAL | Status: DC

## 2015-07-29 ENCOUNTER — Encounter: Payer: Self-pay | Admitting: Psychiatry

## 2015-07-29 ENCOUNTER — Ambulatory Visit (INDEPENDENT_AMBULATORY_CARE_PROVIDER_SITE_OTHER): Payer: 59 | Admitting: Psychiatry

## 2015-07-29 VITALS — BP 124/82 | HR 56 | Temp 97.2°F | Ht 65.0 in | Wt 247.0 lb

## 2015-07-29 DIAGNOSIS — F331 Major depressive disorder, recurrent, moderate: Secondary | ICD-10-CM

## 2015-07-29 MED ORDER — MELATONIN 3 MG PO TABS: 3.0000 mg | ORAL_TABLET | Freq: Every day | ORAL | Status: DC

## 2015-07-29 MED ORDER — TRAZODONE HCL 100 MG PO TABS: 100.0000 mg | ORAL_TABLET | Freq: Every evening | ORAL | Status: DC | PRN

## 2015-07-29 MED ORDER — FLUOXETINE HCL 20 MG PO CAPS: 60.0000 mg | ORAL_CAPSULE | Freq: Every day | ORAL | Status: DC

## 2015-07-29 MED ORDER — LORAZEPAM 1 MG PO TABS: 1.0000 mg | ORAL_TABLET | Freq: Two times a day (BID) | ORAL | Status: DC

## 2015-07-29 NOTE — Progress Notes (Signed)
[facility] MD/PA/NP OP Progress Note  07/29/2015 10:59 AM Carly Marks  MRN:  [id number]  Subjective:  Patient returns for follow-up of her major depressive disorder, recurrent moderate. She states overall things are going well. States her main complaint today is difficulty sleeping. She states she takes the trazodone every night is able to fall asleep. When I asked her how long she remains asleep before she wakes up she stated she is not sure. She indicated she's been on Ambien past but had some problem with it. I then did discuss some melatonin. She has not tried this.  She states she is able to enjoy things and that sometimes she gets together with family and goes out to dinner and states this occurs maybe twice a month. She is otherwise she stays in her house she might do activities such as laundry. She states that her appetite is good. She denies any process for medications. Chief Complaint:  Chief Complaint    Follow-up; Medication Refill; Anxiety; Insomnia     Visit Diagnosis:     ICD-9-CM ICD-10-CM   1. Major depressive disorder, recurrent episode, moderate 296.32 F33.1 traZODone (DESYREL) 100 MG tablet    Past Medical History:  Past Medical History  Diagnosis Date  . GERD (gastroesophageal reflux disease)   . Depression   . Anxiety   . Obstructive sleep apnea   . HTN (hypertension)   . Anemia   . HA (headache)   . Arthritis   . Major depressive disorder, recurrent episode, moderate   . Family history of colonic polyps   . CAD (coronary artery disease)     per patient    Past Surgical History  Procedure Laterality Date  . Tubal ligation    . Colonoscopy N/A 04/22/2015    Procedure: COLONOSCOPY;  Surgeon: Hulen Luster, MD;  Location: [facility] ENDOSCOPY;  Service: Gastroenterology;  Laterality: N/A;   Family History:  Family History  Problem Relation Age of Onset  . Cervical cancer Mother   . Hypertension Mother   . Diabetes Maternal Aunt   . Diabetes Maternal Uncle   .  Hypertension Sister   . Breast cancer Sister   . Colon cancer Maternal Grandmother   . Hypertension Brother    Social History:  Social History   Social History  . Marital Status: Single    Spouse Name: N/A  . Number of Children: 3  . Years of Education: 10 TH   Social History Main Topics  . Smoking status: Never Smoker   . Smokeless tobacco: Never Used  . Alcohol Use: No  . Drug Use: No  . Sexual Activity: No   Other Topics Concern  . None   Social History Narrative   Additional History:   Assessment:   Musculoskeletal: Strength & Muscle Tone: within normal limits Gait & Station: normal Patient leans: N/A  Psychiatric Specialty Exam: Anxiety Symptoms include insomnia. Patient reports no nervous/anxious behavior or suicidal ideas.    Insomnia PMH includes: no depression (today discusses that it's more related to social situation and thoughts that she has about her life).    Review of Systems  Psychiatric/Behavioral: Negative for depression (today discusses that it's more related to social situation and thoughts that she has about her life), suicidal ideas, hallucinations, memory loss and substance abuse. The patient has insomnia. The patient is not nervous/anxious.     Blood pressure 124/82, pulse 56, temperature 97.2 F (36.2 C), temperature source Tympanic, height 5\' 5"  (1.651 m), weight 247 lb (  112.038 kg), SpO2 96 %.Body mass index is 41.1 kg/(m^2).  General Appearance: Well Groomed  Eye Contact:  Good  Speech:  Normal Rate  Volume:  Normal  Mood:  okay  Affect:  Constricted  Thought Process:  Linear and Logical  Orientation:  Full (Time, Place, and Person)  Thought Content:  Negative  Suicidal Thoughts:  No  Homicidal Thoughts:  No  Memory:  Immediate;   Good Recent;   Good Remote;   Good  Judgement:  Good  Insight:  Good  Psychomotor Activity:  Normal  Concentration:  Good  Recall:  Good  Fund of Knowledge: Good  Language: Good  Akathisia:   Negative  Handed:  Right unknown  AIMS (if indicated):  Not done  Assets:  Desire for Improvement  ADL's:  Intact  Cognition: WNL  Sleep:  Continues to be fair she estimates she sleeps about 4 hours a night    Is the patient at risk to self?  No. Has the patient been a risk to self in the past 6 months?  No. Has the patient been a risk to self within the distant past?  No. Is the patient a risk to others?  No. Has the patient been a risk to others in the past 6 months?  No. Has the patient been a risk to others within the distant past?  No.  Current Medications: Current Outpatient Prescriptions  Medication Sig Dispense Refill  . acetaminophen (TYLENOL) 500 MG tablet Take by mouth.    Marland Kitchen albuterol (PROVENTIL HFA;VENTOLIN HFA) 108 (90 BASE) MCG/ACT inhaler Inhale 2 puffs into the lungs every 6 (six) hours as needed for wheezing.    Marland Kitchen amLODipine-benazepril (LOTREL) 5-10 MG per capsule Take 5-10 capsules by mouth daily.    Marland Kitchen aspirin 81 MG tablet Take 81 mg by mouth daily.    Marland Kitchen atorvastatin (LIPITOR) 40 MG tablet     . cholecalciferol (VITAMIN D) 400 UNITS TABS tablet Take by mouth.    . docusate sodium (COLACE) 100 MG capsule Take by mouth.    Marland Kitchen FLUoxetine (PROZAC) 20 MG capsule Take 3 capsules (60 mg total) by mouth daily. 90 capsule 3  . fluticasone (FLONASE) 50 MCG/ACT nasal spray Place 2 sprays into the nose daily.    . isosorbide dinitrate (ISORDIL) 30 MG tablet Take by mouth.    Marland Kitchen LORazepam (ATIVAN) 1 MG tablet Take 1 tablet (1 mg total) by mouth 2 (two) times daily. 60 tablet 0  . metoprolol succinate (TOPROL-XL) 25 MG 24 hr tablet Take by mouth.    . metoprolol tartrate (LOPRESSOR) 25 MG tablet     . Nystatin (NYAMYC) 100000 UNIT/GM POWD     . omeprazole (PRILOSEC) 20 MG capsule Take 20 mg by mouth daily.    Marland Kitchen oxybutynin (DITROPAN) 5 MG tablet Take 5 mg by mouth daily.    Marland Kitchen PATANOL 0.1 % ophthalmic solution Place 0.1 drops into both eyes daily.    Marland Kitchen PEG  3350-KCl-NaBcb-NaCl-NaSulf (PEG-3350/ELECTROLYTES) 236 G SOLR     . traZODone (DESYREL) 100 MG tablet Take 1 tablet (100 mg total) by mouth at bedtime as needed for sleep. Take 1 or 1-1/2 tablets at bedtime as needed for insomnia 45 tablet 3  . zoster vaccine live, PF, (ZOSTAVAX) 16109 UNT/0.65ML injection Inject 0.65 mLs into the skin once.    . ciprofloxacin (CIPRO) 500 MG tablet Take 500 mg by mouth 2 (two) times daily. For 7 days    . clonazePAM (KLONOPIN) 0.5 MG tablet  Take 0.5 mg by mouth daily.    . clopidogrel (PLAVIX) 75 MG tablet     . famotidine (PEPCID) 20 MG tablet Take 20 mg by mouth daily.    Marland Kitchen FLUoxetine (PROZAC) 40 MG capsule     . isosorbide mononitrate (IMDUR) 30 MG 24 hr tablet     . LORazepam (ATIVAN) 0.5 MG tablet Take 0.5 mg by mouth 2 (two) times daily as needed for anxiety.    . Melatonin 3 MG TABS Take 1 tablet (3 mg total) by mouth at bedtime. 30 tablet 1  . meloxicam (MOBIC) 15 MG tablet Take 15 mg by mouth daily.    . ranitidine (ZANTAC) 75 MG tablet Take 75 mg by mouth 2 (two) times daily.    . traZODone (DESYREL) 150 MG tablet Take 150 mg by mouth daily.    . traZODone (DESYREL) 50 MG tablet      No current facility-administered medications for this visit.    Medical Decision Making:  Established Problem, Stable/Improving (1)  Treatment Plan Summary:Medication management We're going to continue her medications without any changes. Thus she'll continue trazodone 100 mg tablets, take 1 or 1-1/2 at night as needed for insomnia. She'll continue her lorazepam 1 mg twice daily and are Prozac 60 mg daily. Instructed to take melatonin 3 mg at bedtime. Risk and benefits have been discussed patient's able to consent. She will follow up in 2 months.  Major depressive disorder, recurrent-patient is stable on her regimen of Prozac and trazodone.  Insomnia-continue trazodone as above, we'll also try some melatonin 3 mg at bedtime.  Faith Rogue 07/29/2015, 10:59 AM

## 2015-08-05 ENCOUNTER — Telehealth: Payer: Self-pay | Admitting: Psychiatry

## 2015-08-05 NOTE — Telephone Encounter (Signed)
Spoke with patient today. [facility] faxed her thyroid results from July to [place] on 07/29/2015. Her TSH was normal at 1.62. This lab was collected on 06/03/2015. I informed patient of these results. AW

## 2015-08-19 ENCOUNTER — Other Ambulatory Visit: Payer: Self-pay

## 2015-08-19 MED ORDER — LORAZEPAM 1 MG PO TABS: 1.0000 mg | ORAL_TABLET | Freq: Two times a day (BID) | ORAL | Status: DC

## 2015-08-19 NOTE — Telephone Encounter (Signed)
received a fax requesting a refill on lorazepam 1mg  pt last seen on  07-29-15 next appt 09-27-15.

## 2015-08-19 NOTE — Telephone Encounter (Signed)
faxed - confirmed rx for ativan lorazepam 1mg  id# [id number]  order # [id number]

## 2015-09-26 ENCOUNTER — Ambulatory Visit: Payer: Medicare Other

## 2015-09-27 ENCOUNTER — Encounter: Payer: Self-pay | Admitting: Psychiatry

## 2015-09-27 ENCOUNTER — Ambulatory Visit (INDEPENDENT_AMBULATORY_CARE_PROVIDER_SITE_OTHER): Payer: 59 | Admitting: Psychiatry

## 2015-09-27 VITALS — BP 118/78 | HR 55 | Temp 97.3°F | Ht 65.0 in | Wt 246.2 lb

## 2015-09-27 DIAGNOSIS — F331 Major depressive disorder, recurrent, moderate: Secondary | ICD-10-CM

## 2015-09-27 MED ORDER — LORAZEPAM 1 MG PO TABS: 1.0000 mg | ORAL_TABLET | Freq: Two times a day (BID) | ORAL | Status: DC

## 2015-09-27 NOTE — Progress Notes (Signed)
[place] MD/PA/NP OP Progress Note  09/27/2015 11:17 AM Carly Marks  MRN:  [id number]  Subjective:  Patient returns for follow-up of her major depressive disorder, recurrent moderate. She states overall things are going well. She indicated the biggest issue for rock her right now is trying to get more sleep during the day. I explore this with her and indicated that she has been reporting difficulty sleeping at night and that sleeping during the day as a goal would I clean be counteracting her ability to have a good full nights sleep. She states she typically does sleep during the day and might sleep 2 hours during the day. I reviewed sleep hygiene and recommended that she avoid the napping during the day so that she could increase her chances of sleeping through the night.  She states otherwise she has been stable on the Prozac and feels like her mood is stable. She states she does go out with her daughters to shopping which is somewhat different then when she is depressed. She states she might do this 2 or 3 times a week. She states otherwise when she is at home she does small tasks around the house such as cleaning or washing the dishes. Chief Complaint: More sleep during the day Chief Complaint    Follow-up; Medication Refill     Visit Diagnosis:     ICD-9-CM ICD-10-CM   1. Major depressive disorder, recurrent episode, moderate (HCC) 296.32 F33.1     Past Medical History:  Past Medical History  Diagnosis Date  . GERD (gastroesophageal reflux disease)   . Depression   . Anxiety   . Obstructive sleep apnea   . HTN (hypertension)   . Anemia   . HA (headache)   . Arthritis   . Major depressive disorder, recurrent episode, moderate ([place])   . Family history of colonic polyps   . CAD (coronary artery disease)     per patient    Past Surgical History  Procedure Laterality Date  . Tubal ligation    . Colonoscopy N/A 04/22/2015    Procedure: COLONOSCOPY;  Surgeon: Hulen Luster, MD;   Location: [facility] ENDOSCOPY;  Service: Gastroenterology;  Laterality: N/A;   Family History:  Family History  Problem Relation Age of Onset  . Cervical cancer Mother   . Hypertension Mother   . Diabetes Maternal Aunt   . Diabetes Maternal Uncle   . Hypertension Sister   . Breast cancer Sister   . Colon cancer Maternal Grandmother   . Hypertension Brother    Social History:  Social History   Social History  . Marital Status: Single    Spouse Name: N/A  . Number of Children: 3  . Years of Education: 10 TH   Social History Main Topics  . Smoking status: Never Smoker   . Smokeless tobacco: Never Used  . Alcohol Use: No  . Drug Use: No  . Sexual Activity: No   Other Topics Concern  . None   Social History Narrative   Additional History:   Assessment:   Musculoskeletal: Strength & Muscle Tone: within normal limits Gait & Station: normal Patient leans: N/A  Psychiatric Specialty Exam: Anxiety Symptoms include insomnia. Patient reports no nervous/anxious behavior or suicidal ideas.    Insomnia PMH includes: no depression.    Review of Systems  Psychiatric/Behavioral: Negative for depression, suicidal ideas, hallucinations, memory loss and substance abuse. The patient has insomnia. The patient is not nervous/anxious.   All other systems reviewed and are  negative.   Blood pressure 118/78, pulse 55, temperature 97.3 F (36.3 C), temperature source Tympanic, height 5\' 5"  (1.651 m), weight 246 lb 3.2 oz (111.676 kg), SpO2 98 %.Body mass index is 40.97 kg/(m^2).  General Appearance: Well Groomed  Eye Contact:  Good  Speech:  Normal Rate  Volume:  Normal  Mood:  okay  Affect:  Constricted  Thought Process:  Linear and Logical  Orientation:  Full (Time, Place, and Person)  Thought Content:  Negative  Suicidal Thoughts:  No  Homicidal Thoughts:  No  Memory:  Immediate;   Good Recent;   Good Remote;   Good  Judgement:  Good  Insight:  Good  Psychomotor Activity:   Normal  Concentration:  Good  Recall:  Good  Fund of Knowledge: Good  Language: Good  Akathisia:  Negative  Handed:  Right unknown  AIMS (if indicated):  Not done  Assets:  Desire for Improvement  ADL's:  Intact  Cognition: WNL  Sleep:  Continues to be fair she estimates she sleeps about 4 hours a night    Is the patient at risk to self?  No. Has the patient been a risk to self in the past 6 months?  No. Has the patient been a risk to self within the distant past?  No. Is the patient a risk to others?  No. Has the patient been a risk to others in the past 6 months?  No. Has the patient been a risk to others within the distant past?  No.  Current Medications: Current Outpatient Prescriptions  Medication Sig Dispense Refill  . acetaminophen (TYLENOL) 500 MG tablet Take by mouth.    Marland Kitchen amLODipine-benazepril (LOTREL) 5-10 MG per capsule Take 5-10 capsules by mouth daily.    Marland Kitchen aspirin 81 MG tablet Take 81 mg by mouth daily.    Marland Kitchen atorvastatin (LIPITOR) 40 MG tablet     . budesonide-formoterol (SYMBICORT) 80-4.5 MCG/ACT inhaler twice a day    . cholecalciferol (VITAMIN D) 400 UNITS TABS tablet Take by mouth.    . docusate sodium (COLACE) 100 MG capsule Take by mouth.    Marland Kitchen FLUoxetine (PROZAC) 20 MG capsule Take 3 capsules (60 mg total) by mouth daily. 90 capsule 3  . fluticasone (FLONASE) 50 MCG/ACT nasal spray Place 2 sprays into the nose daily.    Marland Kitchen LORazepam (ATIVAN) 1 MG tablet Take 1 tablet (1 mg total) by mouth 2 (two) times daily. 60 tablet 2  . Melatonin 3 MG TABS Take 1 tablet (3 mg total) by mouth at bedtime. 30 tablet 1  . meloxicam (MOBIC) 15 MG tablet Take 15 mg by mouth daily.    . metoprolol tartrate (LOPRESSOR) 25 MG tablet     . Nystatin (NYAMYC) 100000 UNIT/GM POWD     . omeprazole (PRILOSEC) 20 MG capsule Take 20 mg by mouth daily.    Marland Kitchen oxybutynin (DITROPAN) 5 MG tablet Take 5 mg by mouth daily.    Marland Kitchen PATANOL 0.1 % ophthalmic solution Place 0.1 drops into both eyes  daily.    Marland Kitchen PEG 3350-KCl-NaBcb-NaCl-NaSulf (PEG-3350/ELECTROLYTES) 236 G SOLR     . spironolactone (ALDACTONE) 25 MG tablet     . traZODone (DESYREL) 100 MG tablet Take 1 tablet (100 mg total) by mouth at bedtime as needed for sleep. Take 1 or 1-1/2 tablets at bedtime as needed for insomnia 45 tablet 3  . zoster vaccine live, PF, (ZOSTAVAX) 60454 UNT/0.65ML injection Inject 0.65 mLs into the skin once.  No current facility-administered medications for this visit.    Medical Decision Making:  Established Problem, Stable/Improving (1)  Treatment Plan Summary:Medication management   Major depressive disorder, recurrent-patient is stable on her regimen of Prozac 60 mg daily. She also has lorazepam 1 mg twice daily.  Insomnia-  Patient states she never tried the trazodone 150 mg at night but is only been taking 100 mg at night. She stated with this she might sleep 2 or 3 hours at night She also states she never started the melatonin 3 mg at bedtime even though she did pick up the prescription for it. As discussed above I have counseled her to try to eliminate the daytime napping to improve her evening sleep.  Faith Rogue 09/27/2015, 11:16 AM

## 2015-10-08 ENCOUNTER — Other Ambulatory Visit: Payer: Self-pay

## 2015-10-08 ENCOUNTER — Encounter: Payer: Self-pay | Admitting: Urgent Care

## 2015-10-08 ENCOUNTER — Observation Stay
Admission: EM | Admit: 2015-10-08 | Discharge: 2015-10-10 | Disposition: A | Discharge status: Home / Self Care | Payer: Medicare Other | Attending: Internal Medicine | Admitting: Internal Medicine

## 2015-10-08 ENCOUNTER — Inpatient Hospital Stay: Payer: Medicare Other | Attending: Internal Medicine | Admitting: Internal Medicine

## 2015-10-08 ENCOUNTER — Emergency Department: Payer: Medicare Other

## 2015-10-08 ENCOUNTER — Telehealth: Payer: Self-pay | Admitting: *Deleted

## 2015-10-08 ENCOUNTER — Encounter: Payer: Self-pay | Admitting: *Deleted

## 2015-10-08 VITALS — BP 113/58 | HR 47 | Temp 97.8°F | Resp 18 | Ht 65.0 in | Wt 246.0 lb

## 2015-10-08 DIAGNOSIS — J45909 Unspecified asthma, uncomplicated: Secondary | ICD-10-CM | POA: Diagnosis not present

## 2015-10-08 DIAGNOSIS — I2510 Atherosclerotic heart disease of native coronary artery without angina pectoris: Secondary | ICD-10-CM | POA: Diagnosis not present

## 2015-10-08 DIAGNOSIS — F419 Anxiety disorder, unspecified: Secondary | ICD-10-CM | POA: Insufficient documentation

## 2015-10-08 DIAGNOSIS — E669 Obesity, unspecified: Secondary | ICD-10-CM | POA: Diagnosis not present

## 2015-10-08 DIAGNOSIS — R0609 Other forms of dyspnea: Secondary | ICD-10-CM | POA: Diagnosis present

## 2015-10-08 DIAGNOSIS — Z888 Allergy status to other drugs, medicaments and biological substances status: Secondary | ICD-10-CM | POA: Diagnosis not present

## 2015-10-08 DIAGNOSIS — M129 Arthropathy, unspecified: Secondary | ICD-10-CM | POA: Insufficient documentation

## 2015-10-08 DIAGNOSIS — J4520 Mild intermittent asthma, uncomplicated: Secondary | ICD-10-CM | POA: Diagnosis not present

## 2015-10-08 DIAGNOSIS — Z79899 Other long term (current) drug therapy: Secondary | ICD-10-CM | POA: Insufficient documentation

## 2015-10-08 DIAGNOSIS — G4733 Obstructive sleep apnea (adult) (pediatric): Secondary | ICD-10-CM | POA: Diagnosis not present

## 2015-10-08 DIAGNOSIS — I10 Essential (primary) hypertension: Secondary | ICD-10-CM | POA: Diagnosis present

## 2015-10-08 DIAGNOSIS — R5381 Other malaise: Secondary | ICD-10-CM | POA: Insufficient documentation

## 2015-10-08 DIAGNOSIS — E785 Hyperlipidemia, unspecified: Secondary | ICD-10-CM | POA: Diagnosis not present

## 2015-10-08 DIAGNOSIS — K5900 Constipation, unspecified: Secondary | ICD-10-CM | POA: Diagnosis not present

## 2015-10-08 DIAGNOSIS — Z7982 Long term (current) use of aspirin: Secondary | ICD-10-CM | POA: Diagnosis not present

## 2015-10-08 DIAGNOSIS — Z8049 Family history of malignant neoplasm of other genital organs: Secondary | ICD-10-CM | POA: Diagnosis not present

## 2015-10-08 DIAGNOSIS — Z8371 Family history of colonic polyps: Secondary | ICD-10-CM | POA: Insufficient documentation

## 2015-10-08 DIAGNOSIS — G4730 Sleep apnea, unspecified: Secondary | ICD-10-CM | POA: Insufficient documentation

## 2015-10-08 DIAGNOSIS — F331 Major depressive disorder, recurrent, moderate: Secondary | ICD-10-CM | POA: Insufficient documentation

## 2015-10-08 DIAGNOSIS — D509 Iron deficiency anemia, unspecified: Secondary | ICD-10-CM | POA: Insufficient documentation

## 2015-10-08 DIAGNOSIS — K219 Gastro-esophageal reflux disease without esophagitis: Secondary | ICD-10-CM | POA: Diagnosis not present

## 2015-10-08 DIAGNOSIS — R002 Palpitations: Secondary | ICD-10-CM | POA: Insufficient documentation

## 2015-10-08 DIAGNOSIS — R001 Bradycardia, unspecified: Secondary | ICD-10-CM | POA: Diagnosis not present

## 2015-10-08 DIAGNOSIS — K5909 Other constipation: Secondary | ICD-10-CM | POA: Diagnosis not present

## 2015-10-08 DIAGNOSIS — F329 Major depressive disorder, single episode, unspecified: Secondary | ICD-10-CM

## 2015-10-08 DIAGNOSIS — M1990 Unspecified osteoarthritis, unspecified site: Secondary | ICD-10-CM | POA: Diagnosis not present

## 2015-10-08 DIAGNOSIS — I129 Hypertensive chronic kidney disease with stage 1 through stage 4 chronic kidney disease, or unspecified chronic kidney disease: Secondary | ICD-10-CM | POA: Diagnosis not present

## 2015-10-08 DIAGNOSIS — N189 Chronic kidney disease, unspecified: Secondary | ICD-10-CM | POA: Diagnosis not present

## 2015-10-08 DIAGNOSIS — Z880 Allergy status to penicillin: Secondary | ICD-10-CM | POA: Insufficient documentation

## 2015-10-08 DIAGNOSIS — R51 Headache: Secondary | ICD-10-CM | POA: Diagnosis not present

## 2015-10-08 DIAGNOSIS — Z885 Allergy status to narcotic agent status: Secondary | ICD-10-CM | POA: Insufficient documentation

## 2015-10-08 DIAGNOSIS — I119 Hypertensive heart disease without heart failure: Secondary | ICD-10-CM | POA: Insufficient documentation

## 2015-10-08 DIAGNOSIS — Z86010 Personal history of colonic polyps: Secondary | ICD-10-CM | POA: Diagnosis not present

## 2015-10-08 DIAGNOSIS — E559 Vitamin D deficiency, unspecified: Secondary | ICD-10-CM | POA: Diagnosis not present

## 2015-10-08 DIAGNOSIS — R42 Dizziness and giddiness: Secondary | ICD-10-CM

## 2015-10-08 DIAGNOSIS — Z803 Family history of malignant neoplasm of breast: Secondary | ICD-10-CM | POA: Diagnosis not present

## 2015-10-08 DIAGNOSIS — D649 Anemia, unspecified: Secondary | ICD-10-CM | POA: Diagnosis not present

## 2015-10-08 DIAGNOSIS — R5383 Other fatigue: Secondary | ICD-10-CM | POA: Insufficient documentation

## 2015-10-08 DIAGNOSIS — Z800 Family history of malignant neoplasm of digestive organs: Secondary | ICD-10-CM | POA: Insufficient documentation

## 2015-10-08 DIAGNOSIS — J309 Allergic rhinitis, unspecified: Secondary | ICD-10-CM | POA: Insufficient documentation

## 2015-10-08 DIAGNOSIS — R109 Unspecified abdominal pain: Secondary | ICD-10-CM | POA: Insufficient documentation

## 2015-10-08 LAB — CBC WITH DIFFERENTIAL/PLATELET
BASOS ABS: 0.1 10*3/uL (ref 0–0.1)
BASOS PCT: 1 %
Eosinophils Absolute: 0.1 10*3/uL (ref 0–0.7)
Eosinophils Relative: 2 %
HEMATOCRIT: 39 % (ref 35.0–47.0)
HEMOGLOBIN: 12.4 g/dL (ref 12.0–16.0)
LYMPHS PCT: 56 %
Lymphs Abs: 2.6 10*3/uL (ref 1.0–3.6)
MCH: 22.3 pg — ABNORMAL LOW (ref 26.0–34.0)
MCHC: 31.7 g/dL — ABNORMAL LOW (ref 32.0–36.0)
MCV: 70.4 fL — AB (ref 80.0–100.0)
MONOS PCT: 8 %
Monocytes Absolute: 0.4 10*3/uL (ref 0.2–0.9)
NEUTROS ABS: 1.5 10*3/uL (ref 1.4–6.5)
NEUTROS PCT: 33 %
PLATELETS: 209 10*3/uL (ref 150–440)
RBC: 5.53 MIL/uL — ABNORMAL HIGH (ref 3.80–5.20)
RDW: 15.2 % — ABNORMAL HIGH (ref 11.5–14.5)
WBC: 4.7 10*3/uL (ref 3.6–11.0)

## 2015-10-08 LAB — BASIC METABOLIC PANEL
Anion gap: 6 (ref 5–15)
BUN: 14 mg/dL (ref 6–20)
CALCIUM: 9.4 mg/dL (ref 8.9–10.3)
CO2: 30 mmol/L (ref 22–32)
CREATININE: 1.12 mg/dL — AB (ref 0.44–1.00)
Chloride: 102 mmol/L (ref 101–111)
GFR calc non Af Amer: 53 mL/min — ABNORMAL LOW (ref >60)
Glucose, Bld: 97 mg/dL (ref 65–99)
Potassium: 4 mmol/L (ref 3.5–5.1)
SODIUM: 138 mmol/L (ref 135–145)

## 2015-10-08 LAB — CBC
HEMATOCRIT: 37.5 % (ref 35.0–47.0)
Hemoglobin: 11.8 g/dL — ABNORMAL LOW (ref 12.0–16.0)
MCH: 22.1 pg — ABNORMAL LOW (ref 26.0–34.0)
MCHC: 31.4 g/dL — AB (ref 32.0–36.0)
MCV: 70.2 fL — ABNORMAL LOW (ref 80.0–100.0)
Platelets: 224 10*3/uL (ref 150–440)
RBC: 5.34 MIL/uL — ABNORMAL HIGH (ref 3.80–5.20)
RDW: 15.3 % — AB (ref 11.5–14.5)
WBC: 5.7 10*3/uL (ref 3.6–11.0)

## 2015-10-08 LAB — LACTATE DEHYDROGENASE: LDH: 165 U/L (ref 98–192)

## 2015-10-08 LAB — TROPONIN I

## 2015-10-08 LAB — MAGNESIUM: Magnesium: 1.9 mg/dL (ref 1.7–2.4)

## 2015-10-08 NOTE — Telephone Encounter (Signed)
Cannot remember what Carly Marks said to her about the metoprolol. Reports that her pulse rate was 47 when seen today. Asking if she is to stop the Metoprolol. I recommended she call Dr Trish Mage office who orders her med and let them know her pulse rate was low and let him order any changes. She is requesting a call back from [facility] call her back tomorrow

## 2015-10-08 NOTE — Progress Notes (Signed)
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 She recently started taking oral iron otc. She states that she craves ice chips and grits.  The patient states that she has a strong family hx of colon polyps.

## 2015-10-08 NOTE — Telephone Encounter (Signed)
Unable to speak to patient. Left msg Reminded patient about instructions. She needs to check her BP and HR tonight with the bp machine. Patient was taught in clinic today how to check her radial pulse. Teach back process was performed today in the clinic. The patient brought her medications with her today in the clinic. I showed her which tablet the metoprolol was in her pre dispensed med dosing packet. I asked the patient to check her vitals before taking this tablet tonight. If HR still remains low, she needed to hold the metoprolol for tonight and call pmd in the morning.

## 2015-10-08 NOTE — ED Notes (Signed)
Patient presents with c/o BRADYcardia concerns. Patient was seen at the cancer center in [facility] today and told to come to the ED. Of note, patient is dizzy and SOB per her reports.

## 2015-10-08 NOTE — H&P (Signed)
[place] [facility] [place] NAME: Carly Marks    MR#:  [id number]  DATE OF BIRTH:  [birth year]  DATE OF ADMISSION:  10/08/2015  PRIMARY CARE PHYSICIAN: Perrin Maltese, MD   REQUESTING/REFERRING PHYSICIAN: Mariea Clonts, MD  CHIEF COMPLAINT:   Chief Complaint  Patient presents with  . Bradycardia    HISTORY OF PRESENT ILLNESS:  Carly Marks  is a 59 y.o. female who presents with bradycardia. Patient has anemia and was at the hematology office today for a checkup. She was noted to be bradycardic and was sent home. However, she and her daughter felt like she should come to be evaluated in the ED. She has been having recent fatigue and weakness and malaise. She was recently started on metoprolol for complaint of palpitations. She is unable to determine if onset of her malaise and fatigue coincides with onset of use of metoprolol. Her rhythm is sinus bradycardia. In the ED her workup is largely normal. Hospitalists were called for evaluation for symptomatic bradycardia.  PAST MEDICAL HISTORY:   Past Medical History  Diagnosis Date  . GERD (gastroesophageal reflux disease)   . Depression   . Anxiety   . Obstructive sleep apnea   . HTN (hypertension)   . Anemia   . HA (headache)   . Arthritis   . Major depressive disorder, recurrent episode, moderate ([place])   . Family history of colonic polyps   . CAD (coronary artery disease)     per patient  . Asthma     PAST SURGICAL HISTORY:   Past Surgical History  Procedure Laterality Date  . Tubal ligation    . Colonoscopy N/A 04/22/2015    Procedure: COLONOSCOPY;  Surgeon: Hulen Luster, MD;  Location[facility] ENDOSCOPY;  Service: Gastroenterology;  Laterality: N/A;  . Cardiac catheterization  02/19/15, 06/2009, 08/2011    SOCIAL HISTORY:   Social History  Substance Use Topics  . Smoking status: Never Smoker   . Smokeless tobacco: Never Used  . Alcohol Use: No    FAMILY HISTORY:   Family  History  Problem Relation Age of Onset  . Hypertension Mother   . Diabetes Maternal Aunt   . Diabetes Maternal Uncle   . Hypertension Sister   . Breast cancer Sister 92  . Colon cancer Maternal Grandmother   . Hypertension Brother   . Depression    . Colon polyps      DRUG ALLERGIES:   Allergies  Allergen Reactions  . Codeine   . Dextroamphetamine   . Nsaids   . Morphine And Related Other (See Comments)    palpitations   . Penicillins Other (See Comments)    Palpitations     MEDICATIONS AT HOME:   Prior to Admission medications   Medication Sig Start Date End Date Taking? Authorizing Provider  acetaminophen (TYLENOL) 500 MG tablet Take by mouth.    Historical Provider, MD  amLODipine-benazepril (LOTREL) 5-10 MG per capsule Take 1 capsule by mouth daily.  08/17/13   Historical Provider, MD  aspirin 81 MG tablet Take 81 mg by mouth daily.    Historical Provider, MD  atorvastatin (LIPITOR) 40 MG tablet Take 40 mg by mouth daily at 6 PM.  04/01/15   Historical Provider, MD  budesonide-formoterol (SYMBICORT) 80-4.5 MCG/ACT inhaler twice a day 09/16/15   Historical Provider, MD  cholecalciferol (VITAMIN D) 400 UNITS TABS tablet Take by mouth.    Historical Provider, MD  docusate sodium (COLACE) 100 MG capsule  Take by mouth.    Historical Provider, MD  ferrous fumarate (HEMOCYTE - 106 MG FE) 325 (106 FE) MG TABS tablet Take 1 tablet by mouth daily.    Historical Provider, MD  FLUoxetine (PROZAC) 20 MG capsule Take 3 capsules (60 mg total) by mouth daily. 07/29/15   Marjie Skiff, MD  fluticasone (FLONASE) 50 MCG/ACT nasal spray Place 2 sprays into the nose daily.    Historical Provider, MD  LORazepam (ATIVAN) 1 MG tablet Take 1 tablet (1 mg total) by mouth 2 (two) times daily. 09/27/15   Marjie Skiff, MD  Melatonin 3 MG TABS Take 1 tablet (3 mg total) by mouth at bedtime. 07/29/15   Marjie Skiff, MD  metoprolol tartrate (LOPRESSOR) 25 MG tablet  04/01/15   Historical Provider,  MD  Nystatin [facility]) 100000 UNIT/GM POWD  05/17/15   Historical Provider, MD  omeprazole (PRILOSEC) 20 MG capsule Take 20 mg by mouth daily.    Historical Provider, MD  oxybutynin (DITROPAN) 5 MG tablet Take 5 mg by mouth daily.    Historical Provider, MD  PATANOL 0.1 % ophthalmic solution Place 0.1 drops into both eyes daily. 05/19/13   Historical Provider, MD  PEG 3350-KCl-NaBcb-NaCl-NaSulf (PEG-3350/ELECTROLYTES) 236 G SOLR  02/14/15   Historical Provider, MD  spironolactone (ALDACTONE) 25 MG tablet  09/16/15   Historical Provider, MD  traZODone (DESYREL) 100 MG tablet Take 1 tablet (100 mg total) by mouth at bedtime as needed for sleep. Take 1 or 1-1/2 tablets at bedtime as needed for insomnia 07/29/15   Marjie Skiff, MD    REVIEW OF SYSTEMS:  Review of Systems  Constitutional: Positive for malaise/fatigue. Negative for fever, chills and weight loss.  HENT: Negative for ear pain, hearing loss and tinnitus.   Eyes: Negative for blurred vision, double vision, pain and redness.  Respiratory: Negative for cough, hemoptysis and shortness of breath.   Cardiovascular: Negative for chest pain, palpitations, orthopnea and leg swelling.  Gastrointestinal: Negative for nausea, vomiting, abdominal pain, diarrhea and constipation.  Genitourinary: Negative for dysuria, frequency and hematuria.  Musculoskeletal: Negative for back pain, joint pain and neck pain.  Skin:       No acne, rash, or lesions  Neurological: Positive for weakness. Negative for dizziness, tremors and focal weakness.  Endo/Heme/Allergies: Negative for polydipsia. Does not bruise/bleed easily.  Psychiatric/Behavioral: Negative for depression. The patient is not nervous/anxious and does not have insomnia.      VITAL SIGNS:   Filed Vitals:   10/08/15 2215 10/08/15 2230 10/08/15 2245 10/08/15 2300  BP:  155/78  151/73  Pulse: 43 51 47 51  Temp:      TempSrc:      Resp: 14 16 14 16   Height:      Weight:      SpO2: 98% 100%  98% 98%   Wt Readings from Last 3 Encounters:  10/08/15 111.585 kg (246 lb)  10/08/15 111.585 kg (246 lb)  09/27/15 111.676 kg (246 lb 3.2 oz)    PHYSICAL EXAMINATION:  Physical Exam  Vitals reviewed. Constitutional: She is oriented to person, place, and time. She appears well-developed and well-nourished. No distress.  HENT:  Head: Normocephalic and atraumatic.  Mouth/Throat: Oropharynx is clear and moist.  Eyes: Conjunctivae and EOM are normal. Pupils are equal, round, and reactive to light. No scleral icterus.  Neck: Normal range of motion. Neck supple. No JVD present. No thyromegaly present.  Cardiovascular: Regular rhythm and intact distal pulses.  Exam reveals no  gallop and no friction rub.   No murmur heard. bradycardia  Respiratory: Effort normal and breath sounds normal. No respiratory distress. She has no wheezes. She has no rales.  GI: Soft. Bowel sounds are normal. She exhibits no distension. There is no tenderness.  Musculoskeletal: Normal range of motion. She exhibits no edema.  No arthritis, no gout  Lymphadenopathy:    She has no cervical adenopathy.  Neurological: She is alert and oriented to person, place, and time. No cranial nerve deficit.  No dysarthria, no aphasia  Skin: Skin is warm and dry. No rash noted. No erythema.  Psychiatric: She has a normal mood and affect. Her behavior is normal. Judgment and thought content normal.    LABORATORY PANEL:   CBC  Recent Labs Lab 10/08/15 2033  WBC 5.7  HGB 11.8*  HCT 37.5  PLT 224   ------------------------------------------------------------------------------------------------------------------  Chemistries   Recent Labs Lab 10/08/15 2033  NA 138  K 4.0  CL 102  CO2 30  GLUCOSE 97  BUN 14  CREATININE 1.12*  CALCIUM 9.4  MG 1.9   ------------------------------------------------------------------------------------------------------------------  Cardiac Enzymes  Recent Labs Lab  10/08/15 2033  TROPONINI <0.03   ------------------------------------------------------------------------------------------------------------------  RADIOLOGY:  Dg Chest 2 View  10/08/2015  CLINICAL DATA:  Bradycardia. EXAM: CHEST  2 VIEW COMPARISON:  02/04/2015 FINDINGS: The heart size and mediastinal contours are within normal limits. Both lungs are clear. The visualized skeletal structures are unremarkable. IMPRESSION: No active cardiopulmonary disease. Electronically Signed   By: Kathreen Devoid   On: 10/08/2015 20:47    EKG:   Orders placed or performed in visit on 10/08/15  . EKG 12-Lead    IMPRESSION AND PLAN:  Principal Problem:   Symptomatic bradycardia - sinus bradycardia, with recent complaints of malaise and fatigue. Possibly iatrogenic due to metoprolol use. We'll hold metoprolol tonight and monitor on telemetry. If her heart rate has not improved by morning, should consider cardiology consult and workup for her bradycardia. Active Problems:   CAD (coronary artery disease) - continue home meds   HTN (hypertension) - controlled, continue home meds   Anxiety - continue home meds   Depression, major, recurrent, moderate ([place]) - continue home meds   GERD (gastroesophageal reflux disease) - equivalent home dose PPI  All the records are reviewed and case discussed with ED provider. Management plans discussed with the patient and/or family.  DVT PROPHYLAXIS: SubQ lovenox  ADMISSION STATUS: Observation  CODE STATUS: Full  TOTAL TIME TAKING CARE OF THIS PATIENT: 40 minutes.    Karmen Altamirano FIELDING 10/08/2015, 11:13 PM  [employer] [facility]  Office  [phone]  CC: Primary care physician; Perrin Maltese, MD

## 2015-10-08 NOTE — ED Provider Notes (Signed)
[facility] Emergency Department Provider Note  ____________________________________________  Time seen: Approximately 8:26 PM  I have reviewed the triage vital signs and the nursing notes.   HISTORY  Chief Complaint Bradycardia    HPI Carly Marks is a 59 y.o. female with a history of hypertension on metoprolol presenting with bradycardia. The patient was at the cancer center today for initial evaluation of her chronic anemia when she was noted to have a heart rate as low as 38. She has documentation of blood pressures in the 110's/50s. The patient states that over the lastseveral days she has been having lightheadedness with standing, exertional shortness of breath, and decreased exercise tolerance. She has also been having palpitations. She denies any chest pain, tightness or pressure. She denies any recent illness including fever, chills, cough, nausea or vomiting.   Past Medical History  Diagnosis Date  . GERD (gastroesophageal reflux disease)   . Depression   . Anxiety   . Obstructive sleep apnea   . HTN (hypertension)   . Anemia   . HA (headache)   . Arthritis   . Major depressive disorder, recurrent episode, moderate ([place])   . Family history of colonic polyps   . CAD (coronary artery disease)     per patient  . Asthma     Patient Active Problem List   Diagnosis Date Noted  . Arteriosclerosis of coronary artery 04/16/2015  . Chronic constipation 04/16/2015  . Clinical depression 04/16/2015  . BP (high blood pressure) 04/16/2015  . Arthritis, degenerative 04/16/2015  . Allergic rhinitis, seasonal 04/16/2015  . Avitaminosis D 04/16/2015  . Depression, major, recurrent, moderate ([place]) 04/15/2015  . Headache(784.0) 08/23/2013  . Fatigue 08/23/2013  . Atypical chest pain 06/26/2013  . Chest pain 06/26/2013  . Breath shortness 06/26/2013  . Brash 06/26/2013  . Arthritis 06/26/2013  . Asthma, intermittent 01/17/2013  . Abdominal pain  08/16/2012  . Airway hyperreactivity 07/20/2011  . Acid reflux 07/20/2011  . Benign essential hypertension 07/20/2011  . HCD (hypertensive cardiovascular disease) 07/20/2011    Past Surgical History  Procedure Laterality Date  . Tubal ligation    . Colonoscopy N/A 04/22/2015    Procedure: COLONOSCOPY;  Surgeon: Hulen Luster, MD;  Location: [facility] ENDOSCOPY;  Service: Gastroenterology;  Laterality: N/A;  . Cardiac catheterization  02/19/15, 06/2009, 08/2011    Current Outpatient Rx  Name  Route  Sig  Dispense  Refill  . acetaminophen (TYLENOL) 500 MG tablet   Oral   Take by mouth.         Marland Kitchen amLODipine-benazepril (LOTREL) 5-10 MG per capsule   Oral   Take 1 capsule by mouth daily.          Marland Kitchen aspirin 81 MG tablet   Oral   Take 81 mg by mouth daily.         Marland Kitchen atorvastatin (LIPITOR) 40 MG tablet   Oral   Take 40 mg by mouth daily at 6 PM.          . budesonide-formoterol (SYMBICORT) 80-4.5 MCG/ACT inhaler      twice a day         . cholecalciferol (VITAMIN D) 400 UNITS TABS tablet   Oral   Take by mouth.         . docusate sodium (COLACE) 100 MG capsule   Oral   Take by mouth.         . ferrous fumarate (HEMOCYTE - 106 MG FE) 325 (106 FE) MG  TABS tablet   Oral   Take 1 tablet by mouth daily.         Marland Kitchen FLUoxetine (PROZAC) 20 MG capsule   Oral   Take 3 capsules (60 mg total) by mouth daily.   90 capsule   3   . fluticasone (FLONASE) 50 MCG/ACT nasal spray   Nasal   Place 2 sprays into the nose daily.         Marland Kitchen LORazepam (ATIVAN) 1 MG tablet   Oral   Take 1 tablet (1 mg total) by mouth 2 (two) times daily.   60 tablet   2   . Melatonin 3 MG TABS   Oral   Take 1 tablet (3 mg total) by mouth at bedtime.   30 tablet   1   . metoprolol tartrate (LOPRESSOR) 25 MG tablet               . Nystatin (NYAMYC) 100000 UNIT/GM POWD               . omeprazole (PRILOSEC) 20 MG capsule   Oral   Take 20 mg by mouth daily.         Marland Kitchen oxybutynin  (DITROPAN) 5 MG tablet   Oral   Take 5 mg by mouth daily.         Marland Kitchen PATANOL 0.1 % ophthalmic solution   Both Eyes   Place 0.1 drops into both eyes daily.         Marland Kitchen PEG 3350-KCl-NaBcb-NaCl-NaSulf (PEG-3350/ELECTROLYTES) 236 G SOLR               . spironolactone (ALDACTONE) 25 MG tablet               . traZODone (DESYREL) 100 MG tablet   Oral   Take 1 tablet (100 mg total) by mouth at bedtime as needed for sleep. Take 1 or 1-1/2 tablets at bedtime as needed for insomnia   45 tablet   3     Allergies Codeine; Dextroamphetamine; Nsaids; Morphine and related; and Penicillins  Family History  Problem Relation Age of Onset  . Hypertension Mother   . Diabetes Maternal Aunt   . Diabetes Maternal Uncle   . Hypertension Sister   . Breast cancer Sister 40  . Colon cancer Maternal Grandmother   . Hypertension Brother   . Depression    . Colon polyps      Social History Social History  Substance Use Topics  . Smoking status: Never Smoker   . Smokeless tobacco: Never Used  . Alcohol Use: No    Review of Systems Constitutional: No fever/chills. Positive lightheadedness. Negative syncope. Eyes: No visual changes. ENT: No sore throat. Cardiovascular: Denies chest pain, positive palpitations. Respiratory: Positive shortness of breath on exertion.  No cough. Gastrointestinal: No abdominal pain.  No nausea, no vomiting.  No diarrhea.  No constipation. Genitourinary: Negative for dysuria. Musculoskeletal: Negative for back pain. Skin: Negative for rash. Neurological: Negative for headaches, focal weakness or numbness.  10-point ROS otherwise negative.  ____________________________________________   PHYSICAL EXAM:  VITAL SIGNS: ED Triage Vitals  Enc Vitals Group     BP 10/08/15 2015 153/56 mmHg     Pulse Rate 10/08/15 2015 41     Resp 10/08/15 2015 18     Temp 10/08/15 2015 98.2 F (36.8 C)     Temp Source 10/08/15 2015 Oral     SpO2 10/08/15 2015 97 %      Weight 10/08/15 2012  246 lb (111.585 kg)     Height 10/08/15 2012 5\' 5"  (1.651 m)     Head Cir --      Peak Flow --      Pain Score 10/08/15 2012 4     Pain Loc --      Pain Edu? --      Excl. in [place]? --     Constitutional: Alert and oriented. Well appearing and in no acute distress. Answer question appropriately. Eyes: Conjunctivae are normal.  EOMI. Head: Atraumatic. Nose: No congestion/rhinnorhea. Mouth/Throat: Mucous membranes are moist.  Neck: No stridor.  Supple.   Cardiovascular: Slow rate, regular rhythm. No murmurs, rubs or gallops.  Respiratory: Normal respiratory effort.  No retractions. Lungs CTAB.  No wheezes, rales or ronchi. Gastrointestinal: Soft and nontender. No distention. No peritoneal signs. Musculoskeletal: No LE edema.  Neurologic:  Normal speech and language. No gross focal neurologic deficits are appreciated.  Skin:  Skin is warm, dry and intact. No rash noted. Psychiatric: Mood and affect are normal. Speech and behavior are normal.  Normal judgement  ____________________________________________   LABS (all labs ordered are listed, but only abnormal results are displayed)  Labs Reviewed  CBC - Abnormal; Notable for the following:    RBC 5.34 (*)    Hemoglobin 11.8 (*)    MCV 70.2 (*)    MCH 22.1 (*)    MCHC 31.4 (*)    RDW 15.3 (*)    All other components within normal limits  BASIC METABOLIC PANEL - Abnormal; Notable for the following:    Creatinine, Ser 1.12 (*)    GFR calc non Af Amer 53 (*)    All other components within normal limits  MAGNESIUM  TROPONIN I   ____________________________________________  EKG  ED ECG REPORT I, Eula Listen, the attending physician, personally viewed and interpreted this ECG.   Date: 10/08/2015  EKG Time: 2014  Rate: 47  Rhythm:  sinus bradycardia  Axis: Normal  Intervals:none  ST&T Change: Nonspecific T-wave inversions in V1 and  V2.  ____________________________________________  RADIOLOGY  Dg Chest 2 View  10/08/2015  CLINICAL DATA:  Bradycardia. EXAM: CHEST  2 VIEW COMPARISON:  02/04/2015 FINDINGS: The heart size and mediastinal contours are within normal limits. Both lungs are clear. The visualized skeletal structures are unremarkable. IMPRESSION: No active cardiopulmonary disease. Electronically Signed   By: Kathreen Devoid   On: 10/08/2015 20:47    ____________________________________________   PROCEDURES  Procedure(s) performed: None  Critical Care performed: No ____________________________________________   INITIAL IMPRESSION / ASSESSMENT AND PLAN / ED COURSE  Pertinent labs & imaging results that were available during my care of the patient were reviewed by me and considered in my medical decision making (see chart for details).  59 y.o. female with a history of hypertension on a beta blocker presenting with symptom at a bradycardia. It is possible that her bradycardia is iatrogenic, however given her age and risk factors will need to keep her at the hospital for further evaluation and treatment. At this time the patient is maintaining her blood pressure. Anticipate admission.  ____________________________________________  FINAL CLINICAL IMPRESSION(S) / ED DIAGNOSES  Final diagnoses:  Symptomatic sinus bradycardia  Postural lightheadedness  DOE (dyspnea on exertion)      NEW MEDICATIONS STARTED DURING THIS VISIT:  New Prescriptions   No medications on file     Eula Listen, MD 10/08/15 2137

## 2015-10-08 NOTE — Telephone Encounter (Signed)
We asked the patient to hold her metoprol tonight if her pulse is below 50. Her HR is 47

## 2015-10-08 NOTE — Progress Notes (Signed)
[place] NOTE  Patient Care Team: Ellamae Sia, MD as PCP - General (Internal Medicine)  CHIEF COMPLAINTS/PURPOSE OF CONSULTATION:   # ANEMIA [name] June 2016; Dr.Oh; next in 5 years]  # CKD [creat 1.2- 1.3];  Obesity;  Depression; CAD  HISTORY OF PRESENTING ILLNESS:  Patient is alone/ only afair historian.  Carly Marks 59 y.o.  female  Referred to [place] for further evaluation of anemia.  Patient has intermittent the low hemoglobin between 11-12. Patient had colonoscopy in June 2016 that was rather unremarkable for any cause of her anemia.  I reviewed the colonoscopy report-  Small polyp retrieved;  Next colonoscopy recommended in 5 years.    patient denies any blood in stools black stools.  No nausea no vomiting.  Complains of fatigue.  No blood in urine.   Patient had some weight loss.  Otherwise no abdominal pain;  She has mild constipation  On taking iron pills;  Improved on stool softener.   ROS: A complete 10 point review of system is done which is negative except mentioned above in history of present illness  MEDICAL HISTORY:  Past Medical History  Diagnosis Date  . GERD (gastroesophageal reflux disease)   . Depression   . Anxiety   . Obstructive sleep apnea   . HTN (hypertension)   . Anemia   . HA (headache)   . Arthritis   . Major depressive disorder, recurrent episode, moderate ([place])   . Family history of colonic polyps   . CAD (coronary artery disease)     per patient  . Asthma     SURGICAL HISTORY: Past Surgical History  Procedure Laterality Date  . Tubal ligation    . Colonoscopy N/A 04/22/2015    Procedure: COLONOSCOPY;  Surgeon: Hulen Luster, MD;  Location: [facility] ENDOSCOPY;  Service: Gastroenterology;  Laterality: N/A;  . Cardiac catheterization  02/19/15, 06/2009, 08/2011    SOCIAL HISTORY: Social History   Social History  . Marital Status: Single    Spouse Name: N/A  . Number of Children: 3  . Years of Education: 10 [employer]    Occupational History  . Not on file.   Social History Main Topics  . Smoking status: Never Smoker   . Smokeless tobacco: Never Used  . Alcohol Use: No  . Drug Use: No  . Sexual Activity: No   Other Topics Concern  . Not on file   Social History Narrative    FAMILY HISTORY: Family History  Problem Relation Age of Onset  . Cervical cancer Mother   . Hypertension Mother   . Diabetes Maternal Aunt   . Diabetes Maternal Uncle   . Hypertension Sister   . Breast cancer Sister   . Colon cancer Maternal Grandmother   . Hypertension Brother   . Depression    . Colon polyps      ALLERGIES:  is allergic to codeine; dextroamphetamine; nsaids; morphine and related; and penicillins.  MEDICATIONS:  Current Outpatient Prescriptions  Medication Sig Dispense Refill  . acetaminophen (TYLENOL) 500 MG tablet Take by mouth.    Marland Kitchen amLODipine-benazepril (LOTREL) 5-10 MG per capsule Take 1 capsule by mouth daily.     Marland Kitchen aspirin 81 MG tablet Take 81 mg by mouth daily.    Marland Kitchen atorvastatin (LIPITOR) 40 MG tablet Take 40 mg by mouth daily at 6 PM.     . budesonide-formoterol (SYMBICORT) 80-4.5 MCG/ACT inhaler twice a day    . cholecalciferol (VITAMIN D) 400  UNITS TABS tablet Take by mouth.    . docusate sodium (COLACE) 100 MG capsule Take by mouth.    Marland Kitchen FLUoxetine (PROZAC) 20 MG capsule Take 3 capsules (60 mg total) by mouth daily. 90 capsule 3  . fluticasone (FLONASE) 50 MCG/ACT nasal spray Place 2 sprays into the nose daily.    Marland Kitchen LORazepam (ATIVAN) 1 MG tablet Take 1 tablet (1 mg total) by mouth 2 (two) times daily. 60 tablet 2  . Melatonin 3 MG TABS Take 1 tablet (3 mg total) by mouth at bedtime. 30 tablet 1  . metoprolol tartrate (LOPRESSOR) 25 MG tablet     . Nystatin (NYAMYC) 100000 UNIT/GM POWD     . omeprazole (PRILOSEC) 20 MG capsule Take 20 mg by mouth daily.    Marland Kitchen oxybutynin (DITROPAN) 5 MG tablet Take 5 mg by mouth daily.    Marland Kitchen PATANOL 0.1 % ophthalmic solution Place 0.1 drops into  both eyes daily.    Marland Kitchen PEG 3350-KCl-NaBcb-NaCl-NaSulf (PEG-3350/ELECTROLYTES) 236 G SOLR     . spironolactone (ALDACTONE) 25 MG tablet     . traZODone (DESYREL) 100 MG tablet Take 1 tablet (100 mg total) by mouth at bedtime as needed for sleep. Take 1 or 1-1/2 tablets at bedtime as needed for insomnia 45 tablet 3   No current facility-administered medications for this visit.      Marland Kitchen  PHYSICAL EXAMINATION:  Filed Vitals:   10/08/15 1448  Resp: 18   Filed Weights   10/08/15 1448  Weight: 246 lb (111.585 kg)    GENERAL: Well-nourished well-developed; Alert, no distress and comfortable.   Alone. Obese. EYES: no pallor or icterus OROPHARYNX: no thrush or ulceration; good dentition  NECK: supple, no masses felt LYMPH:  no palpable lymphadenopathy in the cervical, axillary or inguinal regions LUNGS: clear to auscultation and  No wheeze or crackles HEART/CVS: regular rate & rhythm and no murmurs; No lower extremity edema ABDOMEN: abdomen soft, non-tender and normal bowel sounds Musculoskeletal:no cyanosis of digits and no clubbing  PSYCH: alert & oriented x 3 ; flat affect NEURO: no focal motor/sensory deficits SKIN:  no rashes or significant lesions  LABORATORY DATA:  I have reviewed the data as listed Lab Results  Component Value Date   WBC 6.1 02/04/2015   HGB 12.4 02/04/2015   HCT 40.2 02/04/2015   MCV 71* 02/04/2015   PLT 234 02/04/2015    Recent Labs  10/24/14 1544 02/04/15 0251  NA 139 141  K 3.9 3.7  CL 105 106  CO2 30 28  GLUCOSE 86 108*  BUN 9 19  CREATININE 1.24 1.13*  CALCIUM 9.2 9.7  GFRNONAA 47* 53*  GFRAA 57* >60  PROT 7.3  --   ALBUMIN 3.8  --   AST 11*  --   ALT 18  --   ALKPHOS 60  --   BILITOT 1.3*  --     RADIOGRAPHIC STUDIES: I have personally reviewed the radiological images as listed and agreed with the findings in the report. No results found.  ASSESSMENT & PLAN:   # ANEMIA-  Microcytic mild 11.5-12 intermittent.  Iron studies   Show saturation of 22%; ferritin- 64.  Iron studies are not  Extremely concerning for  Severe iron deficiency/  Cannot explain the severe microcytosis with MCV around 65.  I think patient  Well could have alternative causes of  Microcytosis-  Like hemoglobinopathy.  I recommend checking  Hemoglobin electrophoresis; LDH;  Also CBC today. For now continue by  mouth iron.   #  Patient will follow-up with me in approximately 1-2 weeks to review the results of the blood work.   Thank you Dr.  Humphrey Rolls  For allowing me to participate  In the care of your pleasant patient.  Please do not history to contact me if any questions or concerns in the interim.  All questions were answered. The patient knows to call the clinic with any problems, questions or concerns.   30 minutes face-to-face with the patient with more than 50% time counseling and coordination.     Cammie Sickle, MD 10/08/2015 2:57 PM

## 2015-10-09 DIAGNOSIS — R001 Bradycardia, unspecified: Secondary | ICD-10-CM | POA: Diagnosis not present

## 2015-10-09 LAB — CBC
HCT: 37.2 % (ref 35.0–47.0)
HEMATOCRIT: 35.9 % (ref 35.0–47.0)
HEMOGLOBIN: 11.2 g/dL — AB (ref 12.0–16.0)
Hemoglobin: 11.7 g/dL — ABNORMAL LOW (ref 12.0–16.0)
MCH: 22 pg — ABNORMAL LOW (ref 26.0–34.0)
MCH: 21.9 pg — ABNORMAL LOW (ref 26.0–34.0)
MCHC: 31.3 g/dL — AB (ref 32.0–36.0)
MCHC: 31.2 g/dL — ABNORMAL LOW (ref 32.0–36.0)
MCV: 70.2 fL — ABNORMAL LOW (ref 80.0–100.0)
MCV: 70.2 fL — ABNORMAL LOW (ref 80.0–100.0)
PLATELETS: 204 10*3/uL (ref 150–440)
Platelets: 195 10*3/uL (ref 150–440)
RBC: 5.3 MIL/uL — ABNORMAL HIGH (ref 3.80–5.20)
RBC: 5.11 MIL/uL (ref 3.80–5.20)
RDW: 14.9 % — AB (ref 11.5–14.5)
RDW: 15.1 % — AB (ref 11.5–14.5)
WBC: 6.5 10*3/uL (ref 3.6–11.0)
WBC: 4.6 10*3/uL (ref 3.6–11.0)

## 2015-10-09 LAB — BASIC METABOLIC PANEL
ANION GAP: 4 — AB (ref 5–15)
BUN: 13 mg/dL (ref 6–20)
CALCIUM: 9.2 mg/dL (ref 8.9–10.3)
CO2: 31 mmol/L (ref 22–32)
Chloride: 107 mmol/L (ref 101–111)
Creatinine, Ser: 1.07 mg/dL — ABNORMAL HIGH (ref 0.44–1.00)
GFR calc Af Amer: >60 mL/min (ref >60)
GFR, EST NON AFRICAN AMERICAN: 56 mL/min — AB (ref >60)
GLUCOSE: 110 mg/dL — AB (ref 65–99)
POTASSIUM: 4.4 mmol/L (ref 3.5–5.1)
Sodium: 142 mmol/L (ref 135–145)

## 2015-10-09 LAB — CREATININE, SERUM
Creatinine, Ser: 1.07 mg/dL — ABNORMAL HIGH (ref 0.44–1.00)
GFR, EST NON AFRICAN AMERICAN: 56 mL/min — AB (ref >60)

## 2015-10-09 LAB — HEMOGLOBINOPATHY EVALUATION
HGB A: 69 % — AB (ref 94.0–98.0)
HGB F QUANT: 0 % (ref 0.0–2.0)
HGB S QUANTITAION: 26.6 % — AB
Hgb A2 Quant: 4.4 % — ABNORMAL HIGH (ref 0.7–3.1)
Hgb C: 0 %

## 2015-10-09 MED ORDER — SODIUM CHLORIDE 0.9 % IJ SOLN
3.0000 mL | Freq: Two times a day (BID) | INTRAMUSCULAR | Status: DC
  Administered 2015-10-09 (×3): 3 mL via INTRAVENOUS

## 2015-10-09 MED ORDER — LORAZEPAM 1 MG PO TABS
1.0000 mg | ORAL_TABLET | Freq: Two times a day (BID) | ORAL | Status: DC
  Administered 2015-10-09 – 2015-10-10 (×4): 1 mg via ORAL
  Filled 2015-10-09 (×4): qty 1

## 2015-10-09 MED ORDER — OXYBUTYNIN CHLORIDE 5 MG PO TABS
5.0000 mg | ORAL_TABLET | Freq: Every day | ORAL | Status: DC
  Administered 2015-10-09 – 2015-10-10 (×2): 5 mg via ORAL
  Filled 2015-10-09 (×2): qty 1

## 2015-10-09 MED ORDER — BENAZEPRIL HCL 20 MG PO TABS
10.0000 mg | ORAL_TABLET | Freq: Every day | ORAL | Status: DC
  Administered 2015-10-09 – 2015-10-10 (×2): 10 mg via ORAL
  Filled 2015-10-09 (×2): qty 1

## 2015-10-09 MED ORDER — TRAZODONE HCL 50 MG PO TABS
50.0000 mg | ORAL_TABLET | Freq: Every evening | ORAL | Status: DC | PRN
  Administered 2015-10-09: 50 mg via ORAL
  Filled 2015-10-09: qty 1

## 2015-10-09 MED ORDER — ASPIRIN 81 MG PO CHEW
81.0000 mg | CHEWABLE_TABLET | Freq: Every day | ORAL | Status: DC
  Administered 2015-10-09 – 2015-10-10 (×2): 81 mg via ORAL
  Filled 2015-10-09 (×2): qty 1

## 2015-10-09 MED ORDER — AMLODIPINE BESYLATE 5 MG PO TABS
5.0000 mg | ORAL_TABLET | Freq: Every day | ORAL | Status: DC
  Administered 2015-10-09 – 2015-10-10 (×2): 5 mg via ORAL
  Filled 2015-10-09 (×2): qty 1

## 2015-10-09 MED ORDER — PANTOPRAZOLE SODIUM 40 MG PO TBEC
40.0000 mg | DELAYED_RELEASE_TABLET | Freq: Every day | ORAL | Status: DC
  Administered 2015-10-09 – 2015-10-10 (×2): 40 mg via ORAL
  Filled 2015-10-09 (×2): qty 1

## 2015-10-09 MED ORDER — FLUOXETINE HCL 20 MG PO CAPS
60.0000 mg | ORAL_CAPSULE | Freq: Every day | ORAL | Status: DC
  Administered 2015-10-09 – 2015-10-10 (×2): 60 mg via ORAL
  Filled 2015-10-09 (×2): qty 3

## 2015-10-09 MED ORDER — SPIRONOLACTONE 25 MG PO TABS
25.0000 mg | ORAL_TABLET | Freq: Every day | ORAL | Status: DC
  Administered 2015-10-09 – 2015-10-10 (×2): 25 mg via ORAL
  Filled 2015-10-09 (×2): qty 1

## 2015-10-09 MED ORDER — PNEUMOCOCCAL VAC POLYVALENT 25 MCG/0.5ML IJ INJ: 0.5000 mL | INJECTION | INTRAMUSCULAR | Status: DC

## 2015-10-09 MED ORDER — BUDESONIDE-FORMOTEROL FUMARATE 80-4.5 MCG/ACT IN AERO
2.0000 | INHALATION_SPRAY | Freq: Two times a day (BID) | RESPIRATORY_TRACT | Status: DC
  Administered 2015-10-09 – 2015-10-10 (×4): 2 via RESPIRATORY_TRACT
  Filled 2015-10-09: qty 6.9

## 2015-10-09 MED ORDER — ACETAMINOPHEN 650 MG RE SUPP: 650.0000 mg | Freq: Four times a day (QID) | RECTAL | Status: DC | PRN

## 2015-10-09 MED ORDER — ACETAMINOPHEN 325 MG PO TABS: 650.0000 mg | ORAL_TABLET | Freq: Four times a day (QID) | ORAL | Status: DC | PRN

## 2015-10-09 MED ORDER — ATORVASTATIN CALCIUM 20 MG PO TABS
40.0000 mg | ORAL_TABLET | Freq: Every day | ORAL | Status: DC
  Filled 2015-10-09: qty 2

## 2015-10-09 MED ORDER — ENOXAPARIN SODIUM 40 MG/0.4ML ~~LOC~~ SOLN
40.0000 mg | SUBCUTANEOUS | Status: DC
  Administered 2015-10-09: 40 mg via SUBCUTANEOUS
  Filled 2015-10-09: qty 0.4

## 2015-10-09 NOTE — Care Management Obs Status (Signed)
[place] NOTIFICATION   Patient Details  Name: Carly Marks MRN: [id number] Date of Birth: [birth year]   Medicare Observation Status Notification Given:  Yes    Katrina Stack, RN 10/09/2015, 8:19 AM

## 2015-10-09 NOTE — Progress Notes (Signed)
Patient ID: Carly Marks, female   DOB: 25-Mar-1956, 59 y.o.   MRN: [id number] [facility] PROGRESS NOTE  PCP: Perrin Maltese, MD  HPI/Subjective: Patient had some lightheadedness and dizziness upon presentation. This has resolved. Patient was admitted with potential symptomatic bradycardia and her beta blocker was held  Objective: Filed Vitals:   10/09/15 0543 10/09/15 0800  BP: 130/48 135/46  Pulse: 50 46  Temp: 97.8 F (36.6 C) 97.9 F (36.6 C)  Resp: 16 16    Filed Weights   10/08/15 2012 10/09/15 0022  Weight: 111.585 kg (246 lb) 109.498 kg (241 lb 6.4 oz)    ROS: Review of Systems  Constitutional: Negative for fever and chills.  Eyes: Negative for blurred vision.  Respiratory: Negative for cough and shortness of breath.   Cardiovascular: Negative for chest pain and palpitations.  Gastrointestinal: Negative for nausea, vomiting, abdominal pain, diarrhea and constipation.  Genitourinary: Negative for dysuria.  Musculoskeletal: Negative for joint pain.  Neurological: Negative for dizziness and headaches.   Exam: Physical Exam  Constitutional: She is oriented to person, place, and time.  HENT:  Nose: No mucosal edema.  Mouth/Throat: No oropharyngeal exudate or posterior oropharyngeal edema.  Eyes: Conjunctivae, EOM and lids are normal. Pupils are equal, round, and reactive to light.  Neck: No JVD present. Carotid bruit is not present. No edema present. No thyroid mass and no thyromegaly present.  Cardiovascular: S1 normal and S2 normal.  Exam reveals no gallop.   No murmur heard. Pulses:      Dorsalis pedis pulses are 2+ on the right side, and 2+ on the left side.  Respiratory: No respiratory distress. She has no wheezes. She has no rhonchi. She has no rales.  GI: Soft. Bowel sounds are normal. There is no tenderness.  Musculoskeletal:       Right ankle: She exhibits swelling.       Left ankle: She exhibits swelling.  Lymphadenopathy:    She has  no cervical adenopathy.  Neurological: She is alert and oriented to person, place, and time. No cranial nerve deficit.  Skin: Skin is warm. No rash noted. Nails show no clubbing.  Psychiatric: She has a normal mood and affect.    Data Reviewed: Basic Metabolic Panel:  Recent Labs Lab 10/08/15 2033 10/09/15 0110 10/09/15 0421  NA 138  --  142  K 4.0  --  4.4  CL 102  --  107  CO2 30  --  31  GLUCOSE 97  --  110*  BUN 14  --  13  CREATININE 1.12* 1.07* 1.07*  CALCIUM 9.4  --  9.2  MG 1.9  --   --    CBC:  Recent Labs Lab 10/08/15 1535 10/08/15 2033 10/09/15 0110 10/09/15 0421  WBC 4.7 5.7 6.5 4.6  NEUTROABS 1.5  --   --   --   HGB 12.4 11.8* 11.7* 11.2*  HCT 39.0 37.5 37.2 35.9  MCV 70.4* 70.2* 70.2* 70.2*  PLT 209 224 204 195   Cardiac Enzymes:  Recent Labs Lab 10/08/15 2033  TROPONINI <0.03   BNP (last 3 results)  Recent Labs  02/04/15 0251  BNP 74      Studies: Dg Chest 2 View  10/08/2015  CLINICAL DATA:  Bradycardia. EXAM: CHEST  2 VIEW COMPARISON:  02/04/2015 FINDINGS: The heart size and mediastinal contours are within normal limits. Both lungs are clear. The visualized skeletal structures are unremarkable. IMPRESSION: No active cardiopulmonary disease. Electronically Signed  By: Kathreen Devoid   On: 10/08/2015 20:47    Scheduled Meds: . amLODipine  5 mg Oral Daily  . aspirin  81 mg Oral Daily  . atorvastatin  40 mg Oral q1800  . benazepril  10 mg Oral Daily  . budesonide-formoterol  2 puff Inhalation BID  . enoxaparin (LOVENOX) injection  40 mg Subcutaneous Q24H  . FLUoxetine  60 mg Oral Daily  . LORazepam  1 mg Oral BID  . oxybutynin  5 mg Oral Daily  . pantoprazole  40 mg Oral Daily  . [START ON 10/10/2015] pneumococcal 23 valent vaccine  0.5 mL Intramuscular Tomorrow-1000  . sodium chloride  3 mL Intravenous Q12H  . spironolactone  25 mg Oral Daily    Assessment/Plan:  1. Symptomatic bradycardia. Patient is currently asymptomatic  and still bradycardic. Metoprolol tartrate on hold. I spoke with Dr. Humphrey Rolls cardiology to see the patient. Patient was asking what to do for her palpitations and fast heart rate being off the beta blocker. Continue to evaluate for sick sinus syndrome. 2. Essential hypertension continue usual medications 3. Hyperlipidemia unspecified continue atorvastatin 4. Gastroesophageal reflux disease without esophagitis continue Protonix 5. Check orthostatic vital signs.  Code Status:     Code Status Orders        Start     Ordered   10/09/15 0019  Full code   Continuous     10/09/15 0018     Family Communication: Family at bedside Disposition Plan: Home soon  Consultants:  Cardiology  Time spent: 35 minutes  [place], [place] [facility]

## 2015-10-09 NOTE — Progress Notes (Signed)
BLIMIE BACHAND is a 59 y.o. female patient admitted from ED awake, alert - oriented  X 4 - no acute distress noted.  VSS - Blood pressure 147/46, pulse 42, temperature 98 F (36.7 C), temperature source Oral, resp. rate 16, height 5\' 5"  (1.651 m), weight 109.498 kg (241 lb 6.4 oz), SpO2 100 %.    IV in place, occlusive dsg intact without redness.  Orientation to room, and floor completed with information packet given to patient/family. Admission INP armband ID verified with patient/family, and in place.    SR up x 2, fall assessment complete, with patient and family able to verbalize understanding of risk associated with falls, and verbalized understanding to call nsg before up out of bed.  Call light within reach, patient able to voice, and demonstrate understanding.  Skin assessed with Jonelle Sidle, RN. Area of irritation under L breast, pt uses nystatin at home, no current redness or itching. Otherwise skin clean, dry, intact without evidence of bruising, or skin tears.    Will cont to eval and treat per MD orders.  Rachael Fee, RN

## 2015-10-09 NOTE — Evaluation (Signed)
Physical Therapy Evaluation Patient Details Name: Carly Marks MRN: [id number] DOB: 07-29-1956 Today's Date: 10/09/2015   History of Present Illness  presented to ER secondary and admitted under observation secondary to symptomatic bradycardia (HR in 40s).  Clinical Impression  Upon evaluation, patient alert and oriented to basic information; follows simple commands and demonstrates good insight/safety awareness.  Bilat UE/LE strength and ROM grossly WFL and symmetrical; no focal weakness appreciated.  Able to complete bed mobility and sit/stand without assist device, mod indep; gait (x200') without assist device, cga/close sup.  Slight in stability noted with dynamic gait components (patient occasionally reaching for hallway rails), but patient reports this is near baseline for her.  Does occasionally use SPC at baseline. Patient without reports of dizziness, lightheadedness, chest pain/pressure/palpitations during activity.  Does demonstrate appropriate chronotropic response to activity during session (resting HR 70-80s, 90-100s with brief peak at 119 during gait) Would benefit from skilled PT to address higher-level deficits and promote continued mobility throughout remaining hospitalization; anticipate no additional PT needs upon discharge.    Follow Up Recommendations No PT follow up (will keep on caseload during hospitalization to promote continued mobility; anticipate no PT needs upon discharge.)    Equipment Recommendations       Recommendations for Other Services       Precautions / Restrictions Precautions Precautions: Fall Restrictions Weight Bearing Restrictions: No      Mobility  Bed Mobility Overal bed mobility: Independent                Transfers Overall transfer level: Modified independent               General transfer comment: sit/stand without assist device, mod indep  Ambulation/Gait Ambulation/Gait assistance: Supervision Ambulation  Distance (Feet): 220 Feet Assistive device: None     Gait velocity interpretation: <1.8 ft/sec, indicative of risk for recurrent falls General Gait Details: slower, but fairly steady, gait performance without overt buckling or LOb.  Does tend to reach for hallway rail for external stabilization at times; slightly guarded with dynamic gait components.  Per patient, baseline for her.  Stairs            Wheelchair Mobility    Modified Rankin (Stroke Patients Only)       Balance Overall balance assessment: Needs assistance Sitting-balance support: No upper extremity supported;Feet supported Sitting balance-Leahy Scale: Good     Standing balance support: No upper extremity supported Standing balance-Leahy Scale: Fair                               Pertinent Vitals/Pain Pain Assessment: No/denies pain    Home Living Family/patient expects to be discharged to:: Private residence Living Arrangements: Children Available Help at Discharge: Family Type of Home: House Home Access: Stairs to enter Entrance Stairs-Rails: Can reach both Entrance Stairs-Number of Steps: 3 Home Layout: One level Home Equipment: Cane - quad      Prior Function Level of Independence: Independent with assistive device(s)         Comments: Occasional use of SPC as needed (with longer, community distances); otherwise, indep with mobility and ADLs.  Does endorse single fall in previous six months (due to tripping over object)     Hand Dominance        Extremity/Trunk Assessment   Upper Extremity Assessment: Overall WFL for tasks assessed           Lower Extremity Assessment: Overall [facility]  for tasks assessed         Communication   Communication: No difficulties  Cognition Arousal/Alertness: Awake/alert Behavior During Therapy: WFL for tasks assessed/performed Overall Cognitive Status: Within Functional Limits for tasks assessed                      General  Comments      Exercises        Assessment/Plan    PT Assessment Patient needs continued PT services  PT Diagnosis Difficulty walking;Generalized weakness   PT Problem List Decreased balance;Cardiopulmonary status limiting activity;Decreased activity tolerance  PT Treatment Interventions Gait training;Stair training;Functional mobility training;Balance training   PT Goals (Current goals can be found in the Care Plan section) Acute Rehab PT Goals Patient Stated Goal: "to go home" PT Goal Formulation: With patient/family Time For Goal Achievement: 10/23/15 Potential to Achieve Goals: Good    Frequency Min 2X/week   Barriers to discharge        Co-evaluation               End of Session Equipment Utilized During Treatment: Gait belt Activity Tolerance: Patient tolerated treatment well Patient left: in bed;with bed alarm set      Functional Assessment Tool Used: clinical judgement Functional Limitation: Mobility: Walking and moving around Mobility: Walking and Moving Around Current Status [id number]): At least 1 percent but less than 20 percent impaired, limited or restricted Mobility: Walking and Moving Around Goal Status [phone]): At least 1 percent but less than 20 percent impaired, limited or restricted    Time: [id number] PT Time Calculation (min) (ACUTE ONLY): 14 min   Charges:   PT Evaluation $Initial PT Evaluation Tier I: 1 Procedure PT Treatments $Therapeutic Activity: 8-22 mins   PT G Codes:   PT G-Codes **NOT FOR INPATIENT CLASS** Functional Assessment Tool Used: clinical judgement Functional Limitation: Mobility: Walking and moving around Mobility: Walking and Moving Around Current Status [id number]): At least 1 percent but less than 20 percent impaired, limited or restricted Mobility: Walking and Moving Around Goal Status [phone]): At least 1 percent but less than 20 percent impaired, limited or restricted    Donnamarie Shankles H. Owens Shark, PT, DPT, NCS 10/09/2015,  10:13 AM [phone]

## 2015-10-09 NOTE — Consult Note (Signed)
Primary Cardiologist: Dr. Neoma Laming   Reason for Consultation : Symptomatic bradycardia    HPI :This is a 76yoF with PMHx HTN, OSA, GERD, and palpitations who presented to ER yesterday afternoon 2/2 feeling tired and dizzy, found to have pulse of 38, thus was admitted and cardiology was consulted. Pt was placed on low dose metoprolol during ER visit in March 2/2 palpitations. HR remained stable during most office visit, thus was continued. HR on tele in 50-60s now. She states she is feeling much better, no further dizziness or weakness.         Review of Systems: General: negative for chills, fever, night sweats or weight changes.  Cardiovascular: negative for chest pain, edema, orthopnea, palpitations, paroxysmal nocturnal dyspnea, shortness of breath or dyspnea on exertion Dermatological: negative for rash Respiratory: negative for cough or wheezing Urologic: negative for hematuria Abdominal: negative for nausea, vomiting, diarrhea, bright red blood per rectum, melena, or hematemesis Neurologic: negative for visual changes, syncope, or dizziness All other systems reviewed and are otherwise negative except as noted above.    Past Medical History  Diagnosis Date  . GERD (gastroesophageal reflux disease)   . Depression   . Anxiety   . Obstructive sleep apnea   . HTN (hypertension)   . Anemia   . HA (headache)   . Arthritis   . Major depressive disorder, recurrent episode, moderate ([place])   . Family history of colonic polyps   . CAD (coronary artery disease)     per patient  . Asthma     Medications Prior to Admission  Medication Sig Dispense Refill  . acetaminophen (TYLENOL) 500 MG tablet Take by mouth.    Marland Kitchen amLODipine-benazepril (LOTREL) 5-10 MG per capsule Take 1 capsule by mouth daily.     Marland Kitchen aspirin 81 MG tablet Take 81 mg by mouth daily.    Marland Kitchen atorvastatin (LIPITOR) 40 MG tablet Take 40 mg by mouth daily at 6 PM.     . budesonide-formoterol (SYMBICORT) 80-4.5  MCG/ACT inhaler twice a day    . cholecalciferol (VITAMIN D) 400 UNITS TABS tablet Take by mouth.    . docusate sodium (COLACE) 100 MG capsule Take by mouth.    . ferrous fumarate (HEMOCYTE - 106 MG FE) 325 (106 FE) MG TABS tablet Take 1 tablet by mouth daily.    Marland Kitchen FLUoxetine (PROZAC) 20 MG capsule Take 3 capsules (60 mg total) by mouth daily. 90 capsule 3  . fluticasone (FLONASE) 50 MCG/ACT nasal spray Place 2 sprays into the nose daily.    Marland Kitchen LORazepam (ATIVAN) 1 MG tablet Take 1 tablet (1 mg total) by mouth 2 (two) times daily. 60 tablet 2  . Melatonin 3 MG TABS Take 1 tablet (3 mg total) by mouth at bedtime. 30 tablet 1  . metoprolol tartrate (LOPRESSOR) 25 MG tablet     . Nystatin (NYAMYC) 100000 UNIT/GM POWD     . omeprazole (PRILOSEC) 20 MG capsule Take 20 mg by mouth daily.    Marland Kitchen oxybutynin (DITROPAN) 5 MG tablet Take 5 mg by mouth daily.    Marland Kitchen PATANOL 0.1 % ophthalmic solution Place 0.1 drops into both eyes daily.    Marland Kitchen PEG 3350-KCl-NaBcb-NaCl-NaSulf (PEG-3350/ELECTROLYTES) 236 G SOLR     . spironolactone (ALDACTONE) 25 MG tablet     . traZODone (DESYREL) 100 MG tablet Take 1 tablet (100 mg total) by mouth at bedtime as needed for sleep. Take 1 or 1-1/2 tablets at bedtime as needed for insomnia  45 tablet 3     . amLODipine  5 mg Oral Daily  . aspirin  81 mg Oral Daily  . atorvastatin  40 mg Oral q1800  . benazepril  10 mg Oral Daily  . budesonide-formoterol  2 puff Inhalation BID  . enoxaparin (LOVENOX) injection  40 mg Subcutaneous Q24H  . FLUoxetine  60 mg Oral Daily  . LORazepam  1 mg Oral BID  . oxybutynin  5 mg Oral Daily  . pantoprazole  40 mg Oral Daily  . [START ON 10/10/2015] pneumococcal 23 valent vaccine  0.5 mL Intramuscular Tomorrow-1000  . sodium chloride  3 mL Intravenous Q12H  . spironolactone  25 mg Oral Daily    Infusions:    Allergies  Allergen Reactions  . Codeine   . Dextroamphetamine   . Nsaids   . Morphine And Related Other (See Comments)     palpitations   . Penicillins Other (See Comments)    Palpitations     Social History   Social History  . Marital Status: Single    Spouse Name: N/A  . Number of Children: 3  . Years of Education: 10 [place]   Occupational History  . Not on file.   Social History Main Topics  . Smoking status: Never Smoker   . Smokeless tobacco: Never Used  . Alcohol Use: No  . Drug Use: No  . Sexual Activity: No   Other Topics Concern  . Not on file   Social History Narrative    Family History  Problem Relation Age of Onset  . Hypertension Mother   . Diabetes Maternal Aunt   . Diabetes Maternal Uncle   . Hypertension Sister   . Breast cancer Sister 85  . Colon cancer Maternal Grandmother   . Hypertension Brother   . Depression    . Colon polyps      PHYSICAL EXAM: Filed Vitals:   10/09/15 0800 10/09/15 1147  BP: 135/46 106/30  Pulse: 46 52  Temp: 97.9 F (36.6 C) 99 F (37.2 C)  Resp: 16 22     Intake/Output Summary (Last 24 hours) at 10/09/15 1326 Last data filed at 10/09/15 1100  Gross per 24 hour  Intake    120 ml  Output      0 ml  Net    120 ml    General:  Well appearing. No respiratory difficulty HEENT: normal Neck: supple. no JVD. Carotids 2+ bilat; no bruits. No lymphadenopathy or thryomegaly appreciated. Cor: PMI nondisplaced. Regular rate & rhythm. No rubs, gallops or murmurs. Lungs: clear Abdomen: soft, nontender, nondistended. No hepatosplenomegaly. No bruits or masses. Good bowel sounds. Extremities: no cyanosis, clubbing, rash, edema Neuro: alert & oriented x 3, cranial nerves grossly intact. moves all 4 extremities w/o difficulty. Affect pleasant.  ECG: sinus bradycardia 47 BPM RBBB NS ST/T changes   Results for orders placed or performed during the hospital encounter of 10/08/15 (from the past 24 hour(s))  CBC     Status: Abnormal   Collection Time: 10/08/15  8:33 PM  Result Value Ref Range   WBC 5.7 3.6 - 11.0 K/uL   RBC 5.34 (H) 3.80 - 5.20  MIL/uL   Hemoglobin 11.8 (L) 12.0 - 16.0 g/dL   HCT 37.5 35.0 - 47.0 %   MCV 70.2 (L) 80.0 - 100.0 fL   MCH 22.1 (L) 26.0 - 34.0 pg   MCHC 31.4 (L) 32.0 - 36.0 g/dL   RDW 15.3 (H) 11.5 - 14.5 %  Platelets 224 150 - 440 K/uL  Basic metabolic panel     Status: Abnormal   Collection Time: 10/08/15  8:33 PM  Result Value Ref Range   Sodium 138 135 - 145 mmol/L   Potassium 4.0 3.5 - 5.1 mmol/L   Chloride 102 101 - 111 mmol/L   CO2 30 22 - 32 mmol/L   Glucose, Bld 97 65 - 99 mg/dL   BUN 14 6 - 20 mg/dL   Creatinine, Ser 1.12 (H) 0.44 - 1.00 mg/dL   Calcium 9.4 8.9 - 10.3 mg/dL   GFR calc non Af Amer 53 (L) >60 mL/min   GFR calc Af Amer >60 >60 mL/min   Anion gap 6 5 - 15  Magnesium     Status: None   Collection Time: 10/08/15  8:33 PM  Result Value Ref Range   Magnesium 1.9 1.7 - 2.4 mg/dL  Troponin I     Status: None   Collection Time: 10/08/15  8:33 PM  Result Value Ref Range   Troponin I <0.03 <0.031 ng/mL  CBC     Status: Abnormal   Collection Time: 10/09/15  1:10 AM  Result Value Ref Range   WBC 6.5 3.6 - 11.0 K/uL   RBC 5.30 (H) 3.80 - 5.20 MIL/uL   Hemoglobin 11.7 (L) 12.0 - 16.0 g/dL   HCT 37.2 35.0 - 47.0 %   MCV 70.2 (L) 80.0 - 100.0 fL   MCH 22.0 (L) 26.0 - 34.0 pg   MCHC 31.3 (L) 32.0 - 36.0 g/dL   RDW 14.9 (H) 11.5 - 14.5 %   Platelets 204 150 - 440 K/uL  Creatinine, serum     Status: Abnormal   Collection Time: 10/09/15  1:10 AM  Result Value Ref Range   Creatinine, Ser 1.07 (H) 0.44 - 1.00 mg/dL   GFR calc non Af Amer 56 (L) >60 mL/min   GFR calc Af Amer >60 >60 mL/min  Basic metabolic panel     Status: Abnormal   Collection Time: 10/09/15  4:21 AM  Result Value Ref Range   Sodium 142 135 - 145 mmol/L   Potassium 4.4 3.5 - 5.1 mmol/L   Chloride 107 101 - 111 mmol/L   CO2 31 22 - 32 mmol/L   Glucose, Bld 110 (H) 65 - 99 mg/dL   BUN 13 6 - 20 mg/dL   Creatinine, Ser 1.07 (H) 0.44 - 1.00 mg/dL   Calcium 9.2 8.9 - 10.3 mg/dL   GFR calc non Af Amer 56  (L) >60 mL/min   GFR calc Af Amer >60 >60 mL/min   Anion gap 4 (L) 5 - 15  CBC     Status: Abnormal   Collection Time: 10/09/15  4:21 AM  Result Value Ref Range   WBC 4.6 3.6 - 11.0 K/uL   RBC 5.11 3.80 - 5.20 MIL/uL   Hemoglobin 11.2 (L) 12.0 - 16.0 g/dL   HCT 35.9 35.0 - 47.0 %   MCV 70.2 (L) 80.0 - 100.0 fL   MCH 21.9 (L) 26.0 - 34.0 pg   MCHC 31.2 (L) 32.0 - 36.0 g/dL   RDW 15.1 (H) 11.5 - 14.5 %   Platelets 195 150 - 440 K/uL   Dg Chest 2 View  10/08/2015  CLINICAL DATA:  Bradycardia. EXAM: CHEST  2 VIEW COMPARISON:  02/04/2015 FINDINGS: The heart size and mediastinal contours are within normal limits. Both lungs are clear. The visualized skeletal structures are unremarkable. IMPRESSION: No active cardiopulmonary disease. Electronically  Signed   By: Kathreen Devoid   On: 10/08/2015 20:47     ASSESSMENT: Symptomatic bradycardia likely 2/2 Bb therapy   PLAN/DISCUSSION: Agree with holding metoprolol, HR already improved to 50-60s. Vagal maneuvers discussed with patient. Ok with keeping patient overnight to monitor HR and rhythm.    Patient and plan discussed with supervising provider, Dr. Neoma Laming, who agrees with above findings.   Kelby Fam [place], [place] 10/09/2015 1:26 PM

## 2015-10-09 NOTE — Care Management (Signed)
Admitted under observation for symptomatic bradycardia.  Metoprolol is on hold.  Concern for sick sinus syndrome.  From home and independent in all adls.

## 2015-10-09 NOTE — Progress Notes (Signed)
PT Cancellation Note  Patient Details Name: Carly Marks MRN: [id number] DOB: 06-Jul-1956   Cancelled Treatment:    Reason Eval/Treat Not Completed: Other (comment) (Evaluation attempted.  Patient currently eating breakfast; will re-attempt at later time/date as patient available.)   Orlen Leedy H. Owens Shark, PT, DPT, NCS 10/09/2015, 8:52 AM [phone]

## 2015-10-10 ENCOUNTER — Ambulatory Visit: Payer: Medicare Other

## 2015-10-10 DIAGNOSIS — R001 Bradycardia, unspecified: Secondary | ICD-10-CM | POA: Diagnosis not present

## 2015-10-10 LAB — CKMB (ARMC ONLY): CK, MB: 0.5 ng/mL (ref 0.5–5.0)

## 2015-10-10 LAB — TROPONIN I: Troponin I: <0.03 ng/mL (ref <0.031)

## 2015-10-10 MED ORDER — NITROGLYCERIN 0.4 MG SL SUBL
0.4000 mg | SUBLINGUAL_TABLET | SUBLINGUAL | Status: DC | PRN
  Administered 2015-10-10 (×2): 0.4 mg via SUBLINGUAL

## 2015-10-10 MED ORDER — METOPROLOL SUCCINATE ER 25 MG PO TB24: 25.0000 mg | ORAL_TABLET | Freq: Every day | ORAL | Status: DC

## 2015-10-10 MED ORDER — MORPHINE SULFATE (PF) 2 MG/ML IV SOLN
2.0000 mg | Freq: Once | INTRAVENOUS | Status: AC
  Administered 2015-10-10: 2 mg via INTRAVENOUS

## 2015-10-10 MED ORDER — MORPHINE SULFATE (PF) 2 MG/ML IV SOLN
INTRAVENOUS | Status: AC
  Administered 2015-10-10: 2 mg via INTRAVENOUS
  Filled 2015-10-10: qty 1

## 2015-10-10 MED ORDER — METOPROLOL SUCCINATE ER 25 MG PO TB24
25.0000 mg | ORAL_TABLET | Freq: Every day | ORAL | Status: DC
Start: 2015-10-10 — End: 2020-12-16

## 2015-10-10 MED ORDER — NITROGLYCERIN 0.4 MG SL SUBL
SUBLINGUAL_TABLET | SUBLINGUAL | Status: AC
  Administered 2015-10-10: 0.4 mg via SUBLINGUAL
  Filled 2015-10-10: qty 1

## 2015-10-10 NOTE — Progress Notes (Signed)
Patient has an acute chest pian of 10/10. Dr. Marcille Blanco was contacted. Sublingual nitro, and 2mg  of morphine were ordered and administered per order. Patient VS remained stable. An order for EKG, troponin and CKMB were also requested.Patient endorsed some relived after administration of 2 sublingual nitro and 2mg  of morphine.

## 2015-10-10 NOTE — Progress Notes (Signed)
SUBJECTIVE: Patient had an episode of palpitation this morning   Filed Vitals:   10/10/15 0659 10/10/15 0700 10/10/15 0712 10/10/15 0800  BP: 206/74 186/75 135/62 130/69  Pulse: 108 103 101 78  Temp:    98.5 F (36.9 C)  TempSrc:      Resp:      Height:      Weight:      SpO2:   97% 98%    Intake/Output Summary (Last 24 hours) at 10/10/15 0904 Last data filed at 10/10/15 0700  Gross per 24 hour  Intake    240 ml  Output   1650 ml  Net  -1410 ml    LABS: Basic Metabolic Panel:  Recent Labs  10/08/15 2033 10/09/15 0110 10/09/15 0421  NA 138  --  142  K 4.0  --  4.4  CL 102  --  107  CO2 30  --  31  GLUCOSE 97  --  110*  BUN 14  --  13  CREATININE 1.12* 1.07* 1.07*  CALCIUM 9.4  --  9.2  MG 1.9  --   --    Liver Function Tests: No results for input(s): AST, ALT, ALKPHOS, BILITOT, PROT, ALBUMIN in the last 72 hours. No results for input(s): LIPASE, AMYLASE in the last 72 hours. CBC:  Recent Labs  10/08/15 1535  10/09/15 0110 10/09/15 0421  WBC 4.7  < > 6.5 4.6  NEUTROABS 1.5  --   --   --   HGB 12.4  < > 11.7* 11.2*  HCT 39.0  < > 37.2 35.9  MCV 70.4*  < > 70.2* 70.2*  PLT 209  < > 204 195  < > = values in this interval not displayed. Cardiac Enzymes:  Recent Labs  10/08/15 2033 10/10/15 0730  CKMB  --  0.5  TROPONINI <0.03 <0.03   BNP: Invalid input(s): POCBNP D-Dimer: No results for input(s): DDIMER in the last 72 hours. Hemoglobin A1C: No results for input(s): HGBA1C in the last 72 hours. Fasting Lipid Panel: No results for input(s): CHOL, HDL, LDLCALC, TRIG, CHOLHDL, LDLDIRECT in the last 72 hours. Thyroid Function Tests: No results for input(s): TSH, T4TOTAL, T3FREE, THYROIDAB in the last 72 hours.  Invalid input(s): FREET3 Anemia Panel: No results for input(s): VITAMINB12, FOLATE, FERRITIN, TIBC, IRON, RETICCTPCT in the last 72 hours.   PHYSICAL EXAM General: Well developed, well nourished, in no acute distress HEENT:   Normocephalic and atramatic Neck:  No JVD.  Lungs: Clear bilaterally to auscultation and percussion. Heart: HRRR . Normal S1 and S2 without gallops or murmurs.  Abdomen: Bowel sounds are positive, abdomen soft and non-tender  Msk:  Back normal, normal gait. Normal strength and tone for age. Extremities: No clubbing, cyanosis or edema.   Neuro: Alert and oriented X 3. Psych:  Good affect, responds appropriately  TELEMETRY: Sinus rhythm at 70 bpm  ASSESSMENT AND PLAN: Sinus bradycardia as low as 30-40 bpm associated with palpitation now with once metoprolol was stopped. She was taking metoprolol tartrate 25 mg twice a day, advise restarting metoprolol succinate 25 mg once a day and can go home with follow-up in the office on Monday.  Principal Problem:   Symptomatic bradycardia Active Problems:   Depression, major, recurrent, moderate ([facility])   CAD (coronary artery disease)   HTN (hypertension)   GERD (gastroesophageal reflux disease)   Anxiety    Reilly Blades A, MD, [facility] 10/10/2015 9:04 AM

## 2015-10-10 NOTE — Care Management (Signed)
For discharge home today.  No discharge needs identified.  Patient does not meet homebound criteria

## 2015-10-10 NOTE — Progress Notes (Signed)
Pt to be discharged today. Iv and tele removed. disch instructions and prescrip given to pt. Pt's own meds returned to her from pharmacy. She will discharge after lunch, accompanied by family member.

## 2015-10-10 NOTE — Discharge Summary (Signed)
[facility] [place] NAME: Carly Marks    MR#:  [id number]  DATE OF BIRTH:  [birth year]  DATE OF ADMISSION:  10/08/2015 ADMITTING PHYSICIAN: Lance Coon, MD  DATE OF DISCHARGE: 10/10/2015 12:58 PM  PRIMARY CARE PHYSICIAN: Perrin Maltese, MD    ADMISSION DIAGNOSIS:  DOE (dyspnea on exertion) [R06.09] Postural lightheadedness [R42] Symptomatic sinus bradycardia [R00.1]  DISCHARGE DIAGNOSIS:  Principal Problem:   Symptomatic bradycardia Active Problems:   Depression, major, recurrent, moderate (HCC)   CAD (coronary artery disease)   HTN (hypertension)   GERD (gastroesophageal reflux disease)   Anxiety   SECONDARY DIAGNOSIS:   Past Medical History  Diagnosis Date  . GERD (gastroesophageal reflux disease)   . Depression   . Anxiety   . Obstructive sleep apnea   . HTN (hypertension)   . Anemia   . HA (headache)   . Arthritis   . Major depressive disorder, recurrent episode, moderate ([place])   . Family history of colonic polyps   . CAD (coronary artery disease)     per patient  . Asthma     HOSPITAL COURSE:   1. Symptomatic bradycardia. Initially the patient's beta blocker was held. She takes metoprolol tartrate 25 mg twice a day. She did have heart rates in the 30s. Heart rate on the day of discharge was in the 70s and 80s. She did have a heart rate that went up around 110. The patient is very nervous about her palpitations and fast heart rate. Case discussed with Dr. Ceasar Mons cardiology to start Toprol-XL 25 mg at night. He will follow-up with the patient next week. 2. Essential hypertension- blood pressure upon discharge normal range 3. Anxiety depression- no changes in psychiatric medication 4. History of coronary artery disease- patient is on aspirin and now Toprol and atorvastatin. 5. Gastroesophageal reflux disease without esophagitis- on omeprazole at home   DISCHARGE CONDITIONS:    Satisfactory  CONSULTS OBTAINED:  Treatment Team:  Dionisio David, MD  DRUG ALLERGIES:   Allergies  Allergen Reactions  . Codeine   . Dextroamphetamine   . Nsaids   . Morphine And Related Other (See Comments)    palpitations   . Penicillins Other (See Comments)    Palpitations     DISCHARGE MEDICATIONS:   Discharge Medication List as of 10/10/2015 11:37 AM    START taking these medications   Details  metoprolol succinate (TOPROL-XL) 25 MG 24 hr tablet Take 1 tablet (25 mg total) by mouth at bedtime., Starting 10/10/2015, Until Discontinued, Print      CONTINUE these medications which have NOT CHANGED   Details  acetaminophen (TYLENOL) 500 MG tablet Take by mouth., Until Discontinued, Historical Med    amLODipine-benazepril (LOTREL) 5-10 MG per capsule Take 1 capsule by mouth daily. , Starting 08/17/2013, Until Discontinued, Historical Med    aspirin 81 MG tablet Take 81 mg by mouth daily., Until Discontinued, Historical Med    atorvastatin (LIPITOR) 40 MG tablet Take 40 mg by mouth daily at 6 PM. , Starting 04/01/2015, Until Discontinued, Historical Med    budesonide-formoterol (SYMBICORT) 80-4.5 MCG/ACT inhaler twice a day, Historical Med    cholecalciferol (VITAMIN D) 400 UNITS TABS tablet Take by mouth., Until Discontinued, Historical Med    docusate sodium (COLACE) 100 MG capsule Take by mouth., Until Discontinued, Historical Med    ferrous fumarate (HEMOCYTE - 106 MG FE) 325 (106 FE) MG TABS tablet Take 1 tablet by mouth daily., Until  Discontinued, Historical Med    FLUoxetine (PROZAC) 20 MG capsule Take 3 capsules (60 mg total) by mouth daily., Starting 07/29/2015, Until Discontinued, Normal    fluticasone (FLONASE) 50 MCG/ACT nasal spray Place 2 sprays into the nose daily., Until Discontinued, Historical Med    LORazepam (ATIVAN) 1 MG tablet Take 1 tablet (1 mg total) by mouth 2 (two) times daily., Starting 09/27/2015, Until Discontinued, Print    Melatonin 3  MG TABS Take 1 tablet (3 mg total) by mouth at bedtime., Starting 07/29/2015, Until Discontinued, Normal    Nystatin ([place]) 100000 UNIT/GM POWD Starting 05/17/2015, Until Discontinued, Historical Med    omeprazole (PRILOSEC) 20 MG capsule Take 20 mg by mouth daily., Until Discontinued, Historical Med    oxybutynin (DITROPAN) 5 MG tablet Take 5 mg by mouth daily., Until Discontinued, Historical Med    PATANOL 0.1 % ophthalmic solution Place 0.1 drops into both eyes daily., Starting 05/19/2013, Until Discontinued, Historical Med    PEG 3350-KCl-NaBcb-NaCl-NaSulf (PEG-3350/ELECTROLYTES) 236 G SOLR Starting 02/14/2015, Until Discontinued, Historical Med    spironolactone (ALDACTONE) 25 MG tablet Starting 09/16/2015, Until Discontinued, Historical Med    traZODone (DESYREL) 100 MG tablet Take 1 tablet (100 mg total) by mouth at bedtime as needed for sleep. Take 1 or 1-1/2 tablets at bedtime as needed for insomnia, Starting 07/29/2015, Until Discontinued, Normal      STOP taking these medications     metoprolol tartrate (LOPRESSOR) 25 MG tablet          DISCHARGE INSTRUCTIONS:   Follow-up next week Dr. Ceasar Mons Follow-up 2 weeks Dr. Lamonte Sakai  If you experience worsening of your admission symptoms, develop shortness of breath, life threatening emergency, suicidal or homicidal thoughts you must seek medical attention immediately by calling 911 or calling your MD immediately  if symptoms less severe.  You Must read complete instructions/literature along with all the possible adverse reactions/side effects for all the Medicines you take and that have been prescribed to you. Take any new Medicines after you have completely understood and accept all the possible adverse reactions/side effects.   Please note  You were cared for by a hospitalist during your hospital stay. If you have any questions about your discharge medications or the care you received while you were in the hospital after  you are discharged, you can call the unit and asked to speak with the hospitalist on call if the hospitalist that took care of you is not available. Once you are discharged, your primary care physician will handle any further medical issues. Please note that NO REFILLS for any discharge medications will be authorized once you are discharged, as it is imperative that you return to your primary care physician (or establish a relationship with a primary care physician if you do not have one) for your aftercare needs so that they can reassess your need for medications and monitor your lab values.    Today   CHIEF COMPLAINT:   Chief Complaint  Patient presents with  . Bradycardia    HISTORY OF PRESENT ILLNESS:  Carly Marks  is a 59 y.o. female with a known history of CAD. She presented with some nonspecific symptoms believed to be secondary to symptomatic bradycardia.   VITAL SIGNS:  Blood pressure 130/69, pulse 78, temperature 98.5 F (36.9 C), temperature source Oral, resp. rate 22, height 5\' 5"  (1.651 m), weight 109.498 kg (241 lb 6.4 oz), SpO2 98 %.    PHYSICAL EXAMINATION:  GENERAL:  59 y.o.-year-old patient  lying in the bed with no acute distress.  EYES: Pupils equal, round, reactive to light and accommodation. No scleral icterus. Extraocular muscles intact.  HEENT: Head atraumatic, normocephalic. Oropharynx and nasopharynx clear.  NECK:  Supple, no jugular venous distention. No thyroid enlargement, no tenderness.  LUNGS: Normal breath sounds bilaterally, no wheezing, rales,rhonchi or crepitation. No use of accessory muscles of respiration.  CARDIOVASCULAR: S1, S2 normal. 26 systolic murmur, no rubs, or gallops.  ABDOMEN: Soft, non-tender, non-distended. Bowel sounds present. No organomegaly or mass.  EXTREMITIES: Trace pedal edema, no cyanosis, or clubbing.  NEUROLOGIC: Cranial nerves II through XII are intact. Muscle strength 5/5 in all extremities. Sensation intact. Gait not  checked.  PSYCHIATRIC: The patient is alert and oriented x 3.  SKIN: No obvious rash, lesion, or ulcer.   DATA REVIEW:   CBC  Recent Labs Lab 10/09/15 0421  WBC 4.6  HGB 11.2*  HCT 35.9  PLT 195    Chemistries   Recent Labs Lab 10/08/15 2033  10/09/15 0421  NA 138  --  142  K 4.0  --  4.4  CL 102  --  107  CO2 30  --  31  GLUCOSE 97  --  110*  BUN 14  --  13  CREATININE 1.12*  < > 1.07*  CALCIUM 9.4  --  9.2  MG 1.9  --   --   < > = values in this interval not displayed.  Cardiac Enzymes  Recent Labs Lab 10/10/15 0730  TROPONINI <0.03     RADIOLOGY:  Dg Chest 2 View  10/08/2015  CLINICAL DATA:  Bradycardia. EXAM: CHEST  2 VIEW COMPARISON:  02/04/2015 FINDINGS: The heart size and mediastinal contours are within normal limits. Both lungs are clear. The visualized skeletal structures are unremarkable. IMPRESSION: No active cardiopulmonary disease. Electronically Signed   By: Kathreen Devoid   On: 10/08/2015 20:47   Management plans discussed with the patient, family, cardiologist and they are in agreement.  CODE STATUS:     Code Status Orders        Start     Ordered   10/09/15 0019  Full code   Continuous     10/09/15 0018      TOTAL TIME TAKING CARE OF THIS PATIENT: 35 minutes.    Loletha Grayer M.D on 10/10/2015 at 3:30 PM  Between 7am to 6pm - Pager - [phone]  After 6pm go to [email or web address] - password [email or web address] [facility]  Office  [phone]  CC: Primary care physician; Perrin Maltese, MD

## 2015-10-15 ENCOUNTER — Encounter: Payer: Self-pay | Admitting: Internal Medicine

## 2015-10-15 ENCOUNTER — Inpatient Hospital Stay: Payer: Medicare Other | Attending: Internal Medicine | Admitting: Internal Medicine

## 2015-10-15 VITALS — BP 106/60 | HR 60 | Temp 97.8°F | Resp 18 | Ht 65.0 in | Wt 241.0 lb

## 2015-10-15 DIAGNOSIS — Z803 Family history of malignant neoplasm of breast: Secondary | ICD-10-CM

## 2015-10-15 DIAGNOSIS — Z7982 Long term (current) use of aspirin: Secondary | ICD-10-CM | POA: Diagnosis not present

## 2015-10-15 DIAGNOSIS — M129 Arthropathy, unspecified: Secondary | ICD-10-CM

## 2015-10-15 DIAGNOSIS — F329 Major depressive disorder, single episode, unspecified: Secondary | ICD-10-CM | POA: Diagnosis not present

## 2015-10-15 DIAGNOSIS — I129 Hypertensive chronic kidney disease with stage 1 through stage 4 chronic kidney disease, or unspecified chronic kidney disease: Secondary | ICD-10-CM | POA: Diagnosis not present

## 2015-10-15 DIAGNOSIS — Z800 Family history of malignant neoplasm of digestive organs: Secondary | ICD-10-CM | POA: Diagnosis not present

## 2015-10-15 DIAGNOSIS — D649 Anemia, unspecified: Secondary | ICD-10-CM | POA: Diagnosis not present

## 2015-10-15 DIAGNOSIS — F419 Anxiety disorder, unspecified: Secondary | ICD-10-CM | POA: Diagnosis not present

## 2015-10-15 DIAGNOSIS — K219 Gastro-esophageal reflux disease without esophagitis: Secondary | ICD-10-CM | POA: Insufficient documentation

## 2015-10-15 DIAGNOSIS — D573 Sickle-cell trait: Secondary | ICD-10-CM | POA: Diagnosis not present

## 2015-10-15 DIAGNOSIS — R001 Bradycardia, unspecified: Secondary | ICD-10-CM | POA: Diagnosis not present

## 2015-10-15 DIAGNOSIS — E669 Obesity, unspecified: Secondary | ICD-10-CM | POA: Insufficient documentation

## 2015-10-15 DIAGNOSIS — Z79899 Other long term (current) drug therapy: Secondary | ICD-10-CM | POA: Insufficient documentation

## 2015-10-15 DIAGNOSIS — I2510 Atherosclerotic heart disease of native coronary artery without angina pectoris: Secondary | ICD-10-CM

## 2015-10-15 DIAGNOSIS — J45909 Unspecified asthma, uncomplicated: Secondary | ICD-10-CM | POA: Insufficient documentation

## 2015-10-15 DIAGNOSIS — N189 Chronic kidney disease, unspecified: Secondary | ICD-10-CM | POA: Insufficient documentation

## 2015-10-15 DIAGNOSIS — G4730 Sleep apnea, unspecified: Secondary | ICD-10-CM

## 2015-10-15 NOTE — Progress Notes (Signed)
[facility] NOTE  Patient Care Team: Perrin Maltese, MD as PCP - General (Internal Medicine)  CHIEF COMPLAINTS/PURPOSE OF CONSULTATION:   # SICKLE CELL TRAIT [mild microcytic anemia] [NOV 2016- HbA-69%; HbS- 26.6%]  # CKD [creat 1.2- 1.3];  Obesity;  Depression; CAD; colo June 2016; Dr.Oh  HISTORY OF PRESENTING ILLNESS:  Patient is alone/ only afair historian.  Carly Marks 59 y.o.  female with chronic anemia is here to review the results of her blood work.   Patient continues to deny any blood in stools black stools.  No nausea no vomiting.  Complains of fatigue.  No blood in urine.  ROS: A complete 10 point review of system is done which is negative except mentioned above in history of present illness  MEDICAL HISTORY:  Past Medical History  Diagnosis Date  . GERD (gastroesophageal reflux disease)   . Depression   . Anxiety   . Obstructive sleep apnea   . HTN (hypertension)   . Anemia   . HA (headache)   . Arthritis   . Major depressive disorder, recurrent episode, moderate ([place])   . Family history of colonic polyps   . CAD (coronary artery disease)     per patient  . Asthma     SURGICAL HISTORY: Past Surgical History  Procedure Laterality Date  . Tubal ligation    . Colonoscopy N/A 04/22/2015    Procedure: COLONOSCOPY;  Surgeon: Hulen Luster, MD;  [facility] ENDOSCOPY;  Service: Gastroenterology;  Laterality: N/A;  . Cardiac catheterization  02/19/15, 06/2009, 08/2011    SOCIAL HISTORY: Social History   Social History  . Marital Status: Single    Spouse Name: N/A  . Number of Children: 3  . Years of Education: 10 [employer]   Occupational History  . Not on file.   Social History Main Topics  . Smoking status: Never Smoker   . Smokeless tobacco: Never Used  . Alcohol Use: No  . Drug Use: No  . Sexual Activity: No   Other Topics Concern  . Not on file   Social History Narrative    FAMILY HISTORY: Family History  Problem  Relation Age of Onset  . Hypertension Mother   . Diabetes Maternal Aunt   . Diabetes Maternal Uncle   . Hypertension Sister   . Breast cancer Sister 67  . Colon cancer Maternal Grandmother   . Hypertension Brother   . Depression    . Colon polyps      ALLERGIES:  is allergic to codeine; dextroamphetamine; nsaids; morphine and related; and penicillins.  MEDICATIONS:  Current Outpatient Prescriptions  Medication Sig Dispense Refill  . acetaminophen (TYLENOL) 500 MG tablet Take by mouth.    Marland Kitchen amLODipine-benazepril (LOTREL) 5-10 MG per capsule Take 1 capsule by mouth daily.     Marland Kitchen aspirin 81 MG tablet Take 81 mg by mouth daily.    Marland Kitchen atorvastatin (LIPITOR) 40 MG tablet Take 40 mg by mouth daily at 6 PM.     . budesonide-formoterol (SYMBICORT) 80-4.5 MCG/ACT inhaler twice a day    . cholecalciferol (VITAMIN D) 400 UNITS TABS tablet Take by mouth.    . docusate sodium (COLACE) 100 MG capsule Take by mouth.    . ferrous fumarate (HEMOCYTE - 106 MG FE) 325 (106 FE) MG TABS tablet Take 1 tablet by mouth daily.    Marland Kitchen FLUoxetine (PROZAC) 20 MG capsule Take 3 capsules (60 mg total) by mouth daily. 90 capsule 3  .  fluticasone (FLONASE) 50 MCG/ACT nasal spray Place 2 sprays into the nose daily.    Marland Kitchen LORazepam (ATIVAN) 1 MG tablet Take 1 tablet (1 mg total) by mouth 2 (two) times daily. 60 tablet 2  . Melatonin 3 MG TABS Take 1 tablet (3 mg total) by mouth at bedtime. 30 tablet 1  . metoprolol succinate (TOPROL-XL) 25 MG 24 hr tablet Take 1 tablet (25 mg total) by mouth at bedtime. 30 tablet 0  . Nystatin (NYAMYC) 100000 UNIT/GM POWD     . omeprazole (PRILOSEC) 20 MG capsule Take 20 mg by mouth daily.    Marland Kitchen oxybutynin (DITROPAN) 5 MG tablet Take 5 mg by mouth daily.    Marland Kitchen PATANOL 0.1 % ophthalmic solution Place 0.1 drops into both eyes daily.    Marland Kitchen PEG 3350-KCl-NaBcb-NaCl-NaSulf (PEG-3350/ELECTROLYTES) 236 G SOLR     . spironolactone (ALDACTONE) 25 MG tablet     . traZODone (DESYREL) 100 MG tablet  Take 1 tablet (100 mg total) by mouth at bedtime as needed for sleep. Take 1 or 1-1/2 tablets at bedtime as needed for insomnia 45 tablet 3   No current facility-administered medications for this visit.      Marland Kitchen  PHYSICAL EXAMINATION:  There were no vitals filed for this visit. There were no vitals filed for this visit.  GENERAL: Well-nourished well-developed; Alert, no distress and comfortable.   Alone. Obese.   LABORATORY DATA:  I have reviewed the data as listed Lab Results  Component Value Date   WBC 4.6 10/09/2015   HGB 11.2* 10/09/2015   HCT 35.9 10/09/2015   MCV 70.2* 10/09/2015   PLT 195 10/09/2015    Recent Labs  10/24/14 1544 02/04/15 0251 10/08/15 2033 10/09/15 0110 10/09/15 0421  NA 139 141 138  --  142  K 3.9 3.7 4.0  --  4.4  CL 105 106 102  --  107  CO2 30 28 30   --  31  GLUCOSE 86 108* 97  --  110*  BUN 9 19 14   --  13  CREATININE 1.24 1.13* 1.12* 1.07* 1.07*  CALCIUM 9.2 9.7 9.4  --  9.2  GFRNONAA 47* 53* 53* 56* 56*  GFRAA 57* >60 >60 >60 >60  PROT 7.3  --   --   --   --   ALBUMIN 3.8  --   --   --   --   AST 11*  --   --   --   --   ALT 18  --   --   --   --   ALKPHOS 60  --   --   --   --   BILITOT 1.3*  --   --   --   --     RADIOGRAPHIC STUDIES: I have personally reviewed the radiological images as listed and agreed with the findings in the report. Dg Chest 2 View  10/08/2015  CLINICAL DATA:  Bradycardia. EXAM: CHEST  2 VIEW COMPARISON:  02/04/2015 FINDINGS: The heart size and mediastinal contours are within normal limits. Both lungs are clear. The visualized skeletal structures are unremarkable. IMPRESSION: No active cardiopulmonary disease. Electronically Signed   By: Kathreen Devoid   On: 10/08/2015 20:47    ASSESSMENT & PLAN:   # ANEMIA-  Microcytic mild 11.5-12 intermittent. Likely from sickle cell trait; hemoglobin A- 69%; hemoglobin is 26%. No evidence of any active hemolysis; as LDH is within normal limits. I do not think this is  from iron  deficiency. So patient can come off iron pills.  The above plan of care was discussed with the patient in detail. A copy of the labs was given. She does not require any follow-ups here in the clinic unless clinical situation changes.   # 15 minutes face-to-face with the patient discussing the above plan of care; more than 50% of time spent on prognosis/ natural history; counseling and coordination.      Cammie Sickle, MD 10/15/2015 11:22 AM

## 2015-11-15 ENCOUNTER — Ambulatory Visit: Payer: 59 | Admitting: Psychiatry

## 2015-11-19 ENCOUNTER — Telehealth: Payer: Self-pay | Admitting: Psychiatry

## 2015-11-19 NOTE — Telephone Encounter (Signed)
See the call from [facility], Dr. Bronwen Betters. He provide [place] with a release of information. He indicated the patient has been with them for a few days. He indicated she's. Presented with perhaps some psychosis, paranoia and delusions that others are out to harm her. He indicated that the daughter as indicated this is probably been going on for about a week. He indicated they've stopped her Prozac, taper off her trazodone and that they have started her on Zyprexa and are considering adding a mood stabilizer. I reviewed with him that she has largely been stable with me on Prozac and trazodone and had not demonstrated signs of psychosis. He did state there was an active UTI and thus there may be some component of delirium. AW

## 2015-11-21 DIAGNOSIS — F312 Bipolar disorder, current episode manic severe with psychotic features: Secondary | ICD-10-CM | POA: Insufficient documentation

## 2015-11-27 ENCOUNTER — Ambulatory Visit: Payer: 59 | Admitting: Psychiatry

## 2016-02-24 ENCOUNTER — Ambulatory Visit: Payer: Medicare Other | Admitting: Dietician

## 2016-04-24 ENCOUNTER — Other Ambulatory Visit: Payer: Self-pay | Admitting: Internal Medicine

## 2016-04-24 DIAGNOSIS — Z1231 Encounter for screening mammogram for malignant neoplasm of breast: Secondary | ICD-10-CM

## 2016-05-06 ENCOUNTER — Other Ambulatory Visit: Payer: Self-pay | Admitting: Internal Medicine

## 2016-05-06 DIAGNOSIS — Z9181 History of falling: Secondary | ICD-10-CM

## 2016-05-06 DIAGNOSIS — S22000A Wedge compression fracture of unspecified thoracic vertebra, initial encounter for closed fracture: Secondary | ICD-10-CM

## 2016-06-09 ENCOUNTER — Other Ambulatory Visit: Payer: Self-pay | Admitting: Orthopedic Surgery

## 2016-06-09 DIAGNOSIS — M5136 Other intervertebral disc degeneration, lumbar region: Secondary | ICD-10-CM

## 2016-06-15 ENCOUNTER — Ambulatory Visit: Payer: Medicare Other

## 2016-06-17 ENCOUNTER — Ambulatory Visit: Admission: RE | Admit: 2016-06-17 | Payer: Medicare Other | Source: Ambulatory Visit

## 2016-06-24 ENCOUNTER — Other Ambulatory Visit: Payer: Self-pay | Admitting: Internal Medicine

## 2016-06-24 DIAGNOSIS — R1012 Left upper quadrant pain: Secondary | ICD-10-CM

## 2016-07-02 ENCOUNTER — Ambulatory Visit: Payer: Medicare Other

## 2016-07-02 ENCOUNTER — Other Ambulatory Visit: Payer: Self-pay | Admitting: Orthopedic Surgery

## 2016-07-02 DIAGNOSIS — M25552 Pain in left hip: Secondary | ICD-10-CM

## 2016-07-03 ENCOUNTER — Ambulatory Visit
Admission: RE | Admit: 2016-07-03 | Discharge: 2016-07-03 | Disposition: A | Discharge status: Home / Self Care | Payer: Medicare Other | Source: Ambulatory Visit | Attending: Internal Medicine | Admitting: Internal Medicine

## 2016-07-03 DIAGNOSIS — K769 Liver disease, unspecified: Secondary | ICD-10-CM | POA: Insufficient documentation

## 2016-07-03 DIAGNOSIS — R93421 Abnormal radiologic findings on diagnostic imaging of right kidney: Secondary | ICD-10-CM | POA: Diagnosis not present

## 2016-07-03 DIAGNOSIS — R1012 Left upper quadrant pain: Secondary | ICD-10-CM | POA: Insufficient documentation

## 2016-07-07 ENCOUNTER — Ambulatory Visit: Payer: Medicare Other

## 2016-07-07 ENCOUNTER — Other Ambulatory Visit: Payer: Medicare Other

## 2016-07-09 ENCOUNTER — Ambulatory Visit
Admission: RE | Admit: 2016-07-09 | Discharge: 2016-07-09 | Disposition: A | Discharge status: Home / Self Care | Payer: Medicare Other | Source: Ambulatory Visit | Attending: Internal Medicine | Admitting: Internal Medicine

## 2016-07-09 DIAGNOSIS — Z1231 Encounter for screening mammogram for malignant neoplasm of breast: Secondary | ICD-10-CM | POA: Diagnosis not present

## 2016-07-10 ENCOUNTER — Ambulatory Visit
Admission: RE | Admit: 2016-07-10 | Discharge: 2016-07-10 | Disposition: A | Discharge status: Home / Self Care | Payer: Medicare Other | Source: Ambulatory Visit | Attending: Orthopedic Surgery | Admitting: Orthopedic Surgery

## 2016-07-10 DIAGNOSIS — M25552 Pain in left hip: Secondary | ICD-10-CM | POA: Diagnosis not present

## 2016-07-10 MED ORDER — IOPAMIDOL (ISOVUE-300) INJECTION 61%
5.0000 mL | Freq: Once | INTRAVENOUS | Status: AC | PRN
  Administered 2016-07-10: 5 mL

## 2016-07-10 MED ORDER — METHYLPREDNISOLONE ACETATE 40 MG/ML INJ SUSP (RADIOLOG
80.0000 mg | Freq: Once | INTRAMUSCULAR | Status: AC
  Administered 2016-07-10: 16 mg via INTRA_ARTICULAR

## 2016-07-10 MED ORDER — BUPIVACAINE HCL (PF) 0.25 % IJ SOLN
10.0000 mL | Freq: Once | INTRAMUSCULAR | Status: AC
  Administered 2016-07-10: 7 mL via INTRA_ARTICULAR
  Filled 2016-07-10: qty 10

## 2016-11-21 ENCOUNTER — Emergency Department: Payer: Medicare Other

## 2016-11-21 ENCOUNTER — Encounter: Payer: Self-pay | Admitting: Emergency Medicine

## 2016-11-21 ENCOUNTER — Emergency Department
Admission: EM | Admit: 2016-11-21 | Discharge: 2016-11-21 | Disposition: A | Discharge status: Home / Self Care | Payer: Medicare Other | Attending: Emergency Medicine | Admitting: Emergency Medicine

## 2016-11-21 DIAGNOSIS — I10 Essential (primary) hypertension: Secondary | ICD-10-CM | POA: Insufficient documentation

## 2016-11-21 DIAGNOSIS — K219 Gastro-esophageal reflux disease without esophagitis: Secondary | ICD-10-CM | POA: Diagnosis not present

## 2016-11-21 DIAGNOSIS — R079 Chest pain, unspecified: Secondary | ICD-10-CM

## 2016-11-21 DIAGNOSIS — Z7982 Long term (current) use of aspirin: Secondary | ICD-10-CM | POA: Insufficient documentation

## 2016-11-21 DIAGNOSIS — J45909 Unspecified asthma, uncomplicated: Secondary | ICD-10-CM | POA: Diagnosis not present

## 2016-11-21 DIAGNOSIS — I2510 Atherosclerotic heart disease of native coronary artery without angina pectoris: Secondary | ICD-10-CM | POA: Diagnosis not present

## 2016-11-21 DIAGNOSIS — Z79899 Other long term (current) drug therapy: Secondary | ICD-10-CM | POA: Diagnosis not present

## 2016-11-21 DIAGNOSIS — Z7984 Long term (current) use of oral hypoglycemic drugs: Secondary | ICD-10-CM | POA: Diagnosis not present

## 2016-11-21 LAB — BASIC METABOLIC PANEL
Anion gap: 8 (ref 5–15)
BUN: 9 mg/dL (ref 6–20)
CO2: 27 mmol/L (ref 22–32)
Calcium: 9 mg/dL (ref 8.9–10.3)
Chloride: 105 mmol/L (ref 101–111)
Creatinine, Ser: 1.09 mg/dL — ABNORMAL HIGH (ref 0.44–1.00)
GFR calc Af Amer: >60 mL/min (ref >60)
GFR, EST NON AFRICAN AMERICAN: 54 mL/min — AB (ref >60)
GLUCOSE: 100 mg/dL — AB (ref 65–99)
POTASSIUM: 3.5 mmol/L (ref 3.5–5.1)
Sodium: 140 mmol/L (ref 135–145)

## 2016-11-21 LAB — CBC
HEMATOCRIT: 42.6 % (ref 35.0–47.0)
Hemoglobin: 13.6 g/dL (ref 12.0–16.0)
MCH: 22.8 pg — AB (ref 26.0–34.0)
MCHC: 31.9 g/dL — ABNORMAL LOW (ref 32.0–36.0)
MCV: 71.4 fL — ABNORMAL LOW (ref 80.0–100.0)
Platelets: 208 10*3/uL (ref 150–440)
RBC: 5.97 MIL/uL — ABNORMAL HIGH (ref 3.80–5.20)
RDW: 15.7 % — AB (ref 11.5–14.5)
WBC: 4.3 10*3/uL (ref 3.6–11.0)

## 2016-11-21 LAB — TROPONIN I: Troponin I: <0.03 ng/mL (ref <0.03)

## 2016-11-21 MED ORDER — GI COCKTAIL ~~LOC~~
30.0000 mL | Freq: Once | ORAL | Status: AC
  Administered 2016-11-21: 30 mL via ORAL

## 2016-11-21 MED ORDER — GI COCKTAIL ~~LOC~~
ORAL | Status: AC
  Administered 2016-11-21: 30 mL via ORAL
  Filled 2016-11-21: qty 30

## 2016-11-21 NOTE — ED Triage Notes (Addendum)
Pt reports chest pain under left breast that started last night. Pt states she vomited x 1 last night.  Denies any SOB.  Pt states she has hx of heart palpitations but denies any currently. Pt states she came to ED because the chest pain has lasted for 2 days. No respiratory distress noted at this time.

## 2016-11-21 NOTE — ED Provider Notes (Signed)
[facility] Emergency Department Provider Note  ____________________________________________   First MD Initiated Contact with Patient 11/21/16 2024     (approximate)  I have reviewed the triage vital signs and the nursing notes.   HISTORY  Chief Complaint Chest Pain   HPI Carly Marks is a 61 y.o. female with a history of reflux on omeprazolewas presenting to the emergency department with 1 day of chest pain. Says the pain lasts for about 5-10 minutes and comes on after she belches. She says that it started after she ate [employer] chicken yesterday. Said that she also several episodes of vomiting yesterday. She denies any shortness of breath. Denies any palpitations. There is a questionable history of coronary artery disease, but her medical record says this is only by her history. Prior to my arrival in the room she was given a GI cocktail and she is now pain-free. There are no exertional symptoms.    Past Medical History:  Diagnosis Date  . Anemia   . Anxiety   . Arthritis   . Asthma   . CAD (coronary artery disease)    per patient  . Depression   . Family history of colonic polyps   . GERD (gastroesophageal reflux disease)   . HA (headache)   . HTN (hypertension)   . Major depressive disorder, recurrent episode, moderate ([place])   . Obstructive sleep apnea     Patient Active Problem List   Diagnosis Date Noted  . Anxiety 10/08/2015  . Symptomatic bradycardia 10/08/2015  . CAD (coronary artery disease) 04/16/2015  . Chronic constipation 04/16/2015  . HTN (hypertension) 04/16/2015  . Arthritis, degenerative 04/16/2015  . Allergic rhinitis, seasonal 04/16/2015  . Avitaminosis D 04/16/2015  . Depression, major, recurrent, moderate ([place]) 04/15/2015  . Atypical chest pain 06/26/2013  . Brash 06/26/2013  . Arthritis 06/26/2013  . Asthma, intermittent 01/17/2013  . Airway hyperreactivity 07/20/2011  . GERD (gastroesophageal reflux disease)  07/20/2011  . HCD (hypertensive cardiovascular disease) 07/20/2011    Past Surgical History:  Procedure Laterality Date  . CARDIAC CATHETERIZATION  02/19/15, 06/2009, 08/2011  . COLONOSCOPY N/A 04/22/2015   Procedure: COLONOSCOPY;  Surgeon: Hulen Luster, MD;  Location: [facility] ENDOSCOPY;  Service: Gastroenterology;  Laterality: N/A;  . TUBAL LIGATION      Prior to Admission medications   Medication Sig Start Date End Date Taking? Authorizing Provider  acetaminophen (TYLENOL) 500 MG tablet Take 1,000 mg by mouth every 4 (four) hours as needed.     Historical Provider, MD  amLODipine-benazepril (LOTREL) 5-10 MG per capsule Take 1 capsule by mouth daily.  08/17/13   Historical Provider, MD  aspirin 81 MG tablet Take 81 mg by mouth daily.    Historical Provider, MD  atorvastatin (LIPITOR) 40 MG tablet Take 40 mg by mouth daily at 6 PM.  04/01/15   Historical Provider, MD  budesonide-formoterol (SYMBICORT) 80-4.5 MCG/ACT inhaler twice a day 09/16/15   Historical Provider, MD  budesonide-formoterol (SYMBICORT) 80-4.5 MCG/ACT inhaler Inhale 2 puffs into the lungs 2 (two) times daily.    Historical Provider, MD  cholecalciferol (VITAMIN D) 400 UNITS TABS tablet Take 400 Units by mouth daily.     Historical Provider, MD  divalproex (DEPAKOTE) 500 MG DR tablet Take 500 mg by mouth at bedtime. 3 tabs nightly    Historical Provider, MD  docusate sodium (COLACE) 100 MG capsule Take 100 mg by mouth 2 (two) times daily.     Historical Provider, MD  doxycycline [facility])  100 MG EC tablet Take 100 mg by mouth 2 (two) times daily.    Historical Provider, MD  ferrous fumarate (HEMOCYTE - 106 MG FE) 325 (106 FE) MG TABS tablet Take 1 tablet by mouth daily.    Historical Provider, MD  FLUoxetine (PROZAC) 20 MG capsule Take 3 capsules (60 mg total) by mouth daily. 07/29/15   Marjie Skiff, MD  fluticasone (FLONASE) 50 MCG/ACT nasal spray Place 2 sprays into the nose daily.    Historical Provider, MD  furosemide (LASIX) 40  MG tablet Take 40 mg by mouth daily.    Historical Provider, MD  gabapentin (NEURONTIN) 300 MG capsule Take 300 mg by mouth at bedtime.    Historical Provider, MD  LORazepam (ATIVAN) 1 MG tablet Take 1 tablet (1 mg total) by mouth 2 (two) times daily. 09/27/15   Marjie Skiff, MD  Melatonin 3 MG TABS Take 1 tablet (3 mg total) by mouth at bedtime. 07/29/15   Marjie Skiff, MD  metFORMIN (GLUCOPHAGE) 500 MG tablet Take 500 mg by mouth 2 (two) times daily with a meal.    Historical Provider, MD  metoprolol succinate (TOPROL-XL) 25 MG 24 hr tablet Take 1 tablet (25 mg total) by mouth at bedtime. 10/10/15   Loletha Grayer, MD  Nystatin [facility]) 100000 UNIT/GM POWD Apply 1 g topically.  05/17/15   Historical Provider, MD  OLANZapine (ZYPREXA) 10 MG tablet Take 10 mg by mouth at bedtime.    Historical Provider, MD  OLANZapine (ZYPREXA) 15 MG tablet Take 15 mg by mouth at bedtime.    Historical Provider, MD  omeprazole (PRILOSEC) 20 MG capsule Take 20 mg by mouth daily.    Historical Provider, MD  oxybutynin (DITROPAN) 5 MG tablet Take 5 mg by mouth daily.    Historical Provider, MD  PATANOL 0.1 % ophthalmic solution Place 0.1 drops into both eyes daily. 05/19/13   Historical Provider, MD  PEG 3350-KCl-NaBcb-NaCl-NaSulf (PEG-3350/ELECTROLYTES) 236 G SOLR  02/14/15   Historical Provider, MD  spironolactone (ALDACTONE) 25 MG tablet  09/16/15   Historical Provider, MD  traMADol (ULTRAM) 50 MG tablet Take 50 mg by mouth at bedtime as needed. One to 3 tabs    Historical Provider, MD  traZODone (DESYREL) 100 MG tablet Take 1 tablet (100 mg total) by mouth at bedtime as needed for sleep. Take 1 or 1-1/2 tablets at bedtime as needed for insomnia 07/29/15   Marjie Skiff, MD  traZODone (DESYREL) 50 MG tablet Take 50 mg by mouth at bedtime. 2 tabs    Historical Provider, MD    Allergies Codeine; Dextroamphetamine; Nsaids; Morphine and related; and Penicillins  Family History  Problem Relation Age of Onset  .  Hypertension Mother   . Diabetes Maternal Aunt   . Diabetes Maternal Uncle   . Hypertension Sister   . Breast cancer Sister 82  . Colon cancer Maternal Grandmother   . Hypertension Brother   . Depression    . Colon polyps      Social History Social History  Substance Use Topics  . Smoking status: Never Smoker  . Smokeless tobacco: Never Used  . Alcohol use No    Review of Systems Constitutional: No fever/chills Eyes: No visual changes. ENT: No sore throat. Cardiovascular: as above Respiratory: Denies shortness of breath. Gastrointestinal: No abdominal pain.  No diarrhea.  No constipation. Genitourinary: Negative for dysuria. Musculoskeletal: Negative for back pain. Skin: Negative for rash. Neurological: Negative for headaches, focal weakness or numbness.  10-point ROS otherwise negative.  ____________________________________________   PHYSICAL EXAM:  VITAL SIGNS: ED Triage Vitals [11/21/16 1757]  Enc Vitals Group     BP 120/74     Pulse Rate 74     Resp 18     Temp 98.2 F (36.8 C)     Temp Source Oral     SpO2 97 %     Weight 250 lb (113.4 kg)     Height 5\' 5"  (1.651 m)     Head Circumference      Peak Flow      Pain Score 7     Pain Loc      Pain Edu?      Excl. in [place]?     Constitutional: Alert and oriented. Well appearing and in no acute distress. Eyes: Conjunctivae are normal. PERRL. EOMI. Head: Atraumatic. Nose: No congestion/rhinnorhea. Mouth/Throat: Mucous membranes are moist.   Neck: No stridor.   Cardiovascular: Normal rate, regular rhythm. Grossly normal heart sounds.  Respiratory: Normal respiratory effort.  No retractions. Lungs CTAB. Gastrointestinal: Soft and nontender. No distention. Musculoskeletal: No lower extremity tenderness nor edema.  No joint effusions. Neurologic:  Normal speech and language. No gross focal neurologic deficits are appreciated.  Skin:  Skin is warm, dry and intact. No rash noted. Psychiatric: Mood and affect  are normal. Speech and behavior are normal.  ____________________________________________   LABS (all labs ordered are listed, but only abnormal results are displayed)  Labs Reviewed  BASIC METABOLIC PANEL - Abnormal; Notable for the following:       Result Value   Glucose, Bld 100 (*)    Creatinine, Ser 1.09 (*)    GFR calc non Af Amer 54 (*)    All other components within normal limits  CBC - Abnormal; Notable for the following:    RBC 5.97 (*)    MCV 71.4 (*)    MCH 22.8 (*)    MCHC 31.9 (*)    RDW 15.7 (*)    All other components within normal limits  TROPONIN I   ____________________________________________  EKG  ED ECG REPORT I, Doran Stabler, the attending physician, personally viewed and interpreted this ECG.   Date: 11/21/2016  EKG Time: 1752  Rate: 72  Rhythm: normal sinus rhythm  Axis: normal  Intervals:none  ST&T Change: T-wave inversion in lead V2 which is unchanged from previous EKGs. The T QRS appearance is also unchanged from previous EKGs.  ____________________________________________  RADIOLOGY DG Chest 2 View (Accession [id number]) (Order [id number])  Imaging  Date: 11/21/2016 Department: [facility] Released By: Ulyses Amor, RN (auto-released) Authorizing: Orbie Pyo, MD  Exam Information   Status Exam Begun  Exam Ended   Final [99] 11/21/2016 6:15 PM 11/21/2016 6:22 PM  PACS Images   Show images for DG Chest 2 View  Study Result   CLINICAL DATA:  Chest pain center and under right breast, cough, and diarrhea x 2 days. Hx - Asthma, CAD, HTN, non-smoker.  EXAM: CHEST  2 VIEW  COMPARISON:  10/08/2015  FINDINGS: Lateral view degraded by patient arm position. Midline trachea. Mild cardiomegaly. Mediastinal contours otherwise within normal limits. No pleural effusion or pneumothorax. Clear lungs.  IMPRESSION: No acute cardiopulmonary disease.  Cardiomegaly without  congestive failure.   Electronically Signed   By: Abigail Miyamoto M.D.   On: 11/21/2016 18:32     ____________________________________________   PROCEDURES  Procedure(s) performed:   Procedures  Critical Care performed:  ____________________________________________   INITIAL IMPRESSION / ASSESSMENT AND PLAN / ED COURSE  Pertinent labs & imaging results that were available during my care of the patient were reviewed by me and considered in my medical decision making (see chart for details).   Clinical Course   ----------------------------------------- 9:33 PM on 11/21/2016 -----------------------------------------  Patient pain-free after 24 hours of intermittent chest pain. Pain was relieved by GI cocktail. Symptoms are consistent with patient's past episodes of GERD. Reassuring workup including an unchanged EKG and negative troponin after 24 hours of symptoms. The patient will be able to be discharged home. I recommend that for breakthrough symptoms that she take Mylanta or Maalox. She'll also be following up Dr. Humphrey Rolls. To return for any worsening concerning symptoms. The patient as well as family understand the plan and are willing to comply.   ____________________________________________   FINAL CLINICAL IMPRESSION(S) / ED DIAGNOSES  Chest pain. GERD.    NEW MEDICATIONS STARTED DURING THIS VISIT:  New Prescriptions   No medications on file     Note:  This document was prepared using Dragon voice recognition software and may include unintentional dictation errors.    Orbie Pyo, MD 11/21/16 [phone]

## 2016-11-21 NOTE — ED Notes (Signed)
Pt states has burning mid-epigastric. States takes nexium in the am. States had [employer] chicken last night and believes her gerd is "acting up". Denies hx of cardiac problems. vss.

## 2016-12-29 ENCOUNTER — Ambulatory Visit: Payer: Medicare Other | Attending: Specialist

## 2016-12-29 DIAGNOSIS — R0683 Snoring: Secondary | ICD-10-CM | POA: Diagnosis present

## 2016-12-29 DIAGNOSIS — I10 Essential (primary) hypertension: Secondary | ICD-10-CM | POA: Insufficient documentation

## 2016-12-29 DIAGNOSIS — E669 Obesity, unspecified: Secondary | ICD-10-CM | POA: Insufficient documentation

## 2016-12-29 DIAGNOSIS — G4733 Obstructive sleep apnea (adult) (pediatric): Secondary | ICD-10-CM | POA: Insufficient documentation

## 2017-03-31 ENCOUNTER — Other Ambulatory Visit: Payer: Self-pay | Admitting: Internal Medicine

## 2017-03-31 DIAGNOSIS — Z1231 Encounter for screening mammogram for malignant neoplasm of breast: Secondary | ICD-10-CM

## 2017-05-21 DIAGNOSIS — F250 Schizoaffective disorder, bipolar type: Secondary | ICD-10-CM | POA: Insufficient documentation

## 2017-08-03 ENCOUNTER — Other Ambulatory Visit: Payer: Self-pay | Admitting: Internal Medicine

## 2017-08-03 ENCOUNTER — Ambulatory Visit
Admission: RE | Admit: 2017-08-03 | Discharge: 2017-08-03 | Disposition: A | Discharge status: Home / Self Care | Payer: Medicare Other | Source: Ambulatory Visit | Attending: Internal Medicine | Admitting: Internal Medicine

## 2017-08-03 DIAGNOSIS — Z1231 Encounter for screening mammogram for malignant neoplasm of breast: Secondary | ICD-10-CM | POA: Insufficient documentation

## 2018-02-18 ENCOUNTER — Ambulatory Visit: Payer: Medicare Other | Attending: Neurology

## 2018-04-21 ENCOUNTER — Ambulatory Visit: Payer: Medicare Other | Attending: Neurology

## 2018-04-21 DIAGNOSIS — F5101 Primary insomnia: Secondary | ICD-10-CM | POA: Insufficient documentation

## 2018-04-21 DIAGNOSIS — G4733 Obstructive sleep apnea (adult) (pediatric): Secondary | ICD-10-CM | POA: Diagnosis not present

## 2018-04-25 ENCOUNTER — Other Ambulatory Visit: Payer: Self-pay | Admitting: Internal Medicine

## 2018-04-25 DIAGNOSIS — D1803 Hemangioma of intra-abdominal structures: Secondary | ICD-10-CM

## 2018-05-07 ENCOUNTER — Ambulatory Visit: Payer: Medicare Other

## 2018-05-19 ENCOUNTER — Ambulatory Visit: Payer: Medicare Other

## 2018-06-24 ENCOUNTER — Emergency Department: Payer: Medicare Other

## 2018-06-24 ENCOUNTER — Observation Stay
Admission: EM | Admit: 2018-06-24 | Discharge: 2018-06-25 | Disposition: A | Discharge status: Home / Self Care | Payer: Medicare Other | Attending: Internal Medicine | Admitting: Internal Medicine

## 2018-06-24 ENCOUNTER — Other Ambulatory Visit: Payer: Self-pay

## 2018-06-24 ENCOUNTER — Encounter: Payer: Self-pay | Admitting: Emergency Medicine

## 2018-06-24 DIAGNOSIS — K219 Gastro-esophageal reflux disease without esophagitis: Secondary | ICD-10-CM | POA: Insufficient documentation

## 2018-06-24 DIAGNOSIS — Z7982 Long term (current) use of aspirin: Secondary | ICD-10-CM | POA: Diagnosis not present

## 2018-06-24 DIAGNOSIS — Z8673 Personal history of transient ischemic attack (TIA), and cerebral infarction without residual deficits: Secondary | ICD-10-CM | POA: Insufficient documentation

## 2018-06-24 DIAGNOSIS — Z880 Allergy status to penicillin: Secondary | ICD-10-CM | POA: Diagnosis not present

## 2018-06-24 DIAGNOSIS — G9389 Other specified disorders of brain: Principal | ICD-10-CM | POA: Insufficient documentation

## 2018-06-24 DIAGNOSIS — I639 Cerebral infarction, unspecified: Secondary | ICD-10-CM | POA: Diagnosis present

## 2018-06-24 DIAGNOSIS — R569 Unspecified convulsions: Secondary | ICD-10-CM | POA: Diagnosis not present

## 2018-06-24 DIAGNOSIS — G4733 Obstructive sleep apnea (adult) (pediatric): Secondary | ICD-10-CM | POA: Diagnosis not present

## 2018-06-24 DIAGNOSIS — F419 Anxiety disorder, unspecified: Secondary | ICD-10-CM | POA: Diagnosis not present

## 2018-06-24 DIAGNOSIS — I2510 Atherosclerotic heart disease of native coronary artery without angina pectoris: Secondary | ICD-10-CM | POA: Insufficient documentation

## 2018-06-24 DIAGNOSIS — J45909 Unspecified asthma, uncomplicated: Secondary | ICD-10-CM | POA: Insufficient documentation

## 2018-06-24 DIAGNOSIS — Z886 Allergy status to analgesic agent status: Secondary | ICD-10-CM | POA: Diagnosis not present

## 2018-06-24 DIAGNOSIS — Z79899 Other long term (current) drug therapy: Secondary | ICD-10-CM | POA: Insufficient documentation

## 2018-06-24 DIAGNOSIS — F209 Schizophrenia, unspecified: Secondary | ICD-10-CM | POA: Diagnosis not present

## 2018-06-24 DIAGNOSIS — I10 Essential (primary) hypertension: Secondary | ICD-10-CM | POA: Diagnosis not present

## 2018-06-24 DIAGNOSIS — Z885 Allergy status to narcotic agent status: Secondary | ICD-10-CM | POA: Diagnosis not present

## 2018-06-24 LAB — URINALYSIS, COMPLETE (UACMP) WITH MICROSCOPIC
Bilirubin Urine: NEGATIVE
Glucose, UA: NEGATIVE mg/dL
Hgb urine dipstick: NEGATIVE
Ketones, ur: NEGATIVE mg/dL
Nitrite: NEGATIVE
PH: 6 (ref 5.0–8.0)
PROTEIN: NEGATIVE mg/dL
SPECIFIC GRAVITY, URINE: 1.013 (ref 1.005–1.030)

## 2018-06-24 LAB — BASIC METABOLIC PANEL
Anion gap: 9 (ref 5–15)
BUN: 13 mg/dL (ref 8–23)
CALCIUM: 9.2 mg/dL (ref 8.9–10.3)
CHLORIDE: 104 mmol/L (ref 98–111)
CO2: 28 mmol/L (ref 22–32)
CREATININE: 1.18 mg/dL — AB (ref 0.44–1.00)
GFR calc Af Amer: 56 mL/min — ABNORMAL LOW (ref >60)
GFR calc non Af Amer: 48 mL/min — ABNORMAL LOW (ref >60)
Glucose, Bld: 84 mg/dL (ref 70–99)
Potassium: 4 mmol/L (ref 3.5–5.1)
Sodium: 141 mmol/L (ref 135–145)

## 2018-06-24 LAB — CBC
HCT: 42.9 % (ref 35.0–47.0)
Hemoglobin: 14 g/dL (ref 12.0–16.0)
MCH: 24.1 pg — AB (ref 26.0–34.0)
MCHC: 32.6 g/dL (ref 32.0–36.0)
MCV: 73.8 fL — AB (ref 80.0–100.0)
PLATELETS: 173 10*3/uL (ref 150–440)
RBC: 5.81 MIL/uL — ABNORMAL HIGH (ref 3.80–5.20)
RDW: 16.7 % — AB (ref 11.5–14.5)
WBC: 5.1 10*3/uL (ref 3.6–11.0)

## 2018-06-24 LAB — TROPONIN I: Troponin I: <0.03 ng/mL (ref <0.03)

## 2018-06-24 MED ORDER — ASPIRIN 81 MG PO CHEW
243.0000 mg | CHEWABLE_TABLET | Freq: Once | ORAL | Status: AC
  Administered 2018-06-24: 243 mg via ORAL
  Filled 2018-06-24: qty 3

## 2018-06-24 MED ORDER — CHOLECALCIFEROL 10 MCG (400 UNIT) PO TABS
400.0000 [IU] | ORAL_TABLET | Freq: Every day | ORAL | Status: DC
  Administered 2018-06-25: 11:00:00 400 [IU] via ORAL
  Filled 2018-06-24: qty 1

## 2018-06-24 MED ORDER — SENNOSIDES-DOCUSATE SODIUM 8.6-50 MG PO TABS: 1.0000 | ORAL_TABLET | Freq: Every evening | ORAL | Status: DC | PRN

## 2018-06-24 MED ORDER — SODIUM CHLORIDE 0.9 % IV SOLN
Freq: Once | INTRAVENOUS | Status: AC
  Administered 2018-06-25: 02:00:00 via INTRAVENOUS

## 2018-06-24 MED ORDER — ACETAMINOPHEN 650 MG RE SUPP
650.0000 mg | RECTAL | Status: DC | PRN
Start: 2018-06-24 — End: 2018-06-25

## 2018-06-24 MED ORDER — TRAZODONE HCL 50 MG PO TABS
50.0000 mg | ORAL_TABLET | Freq: Every evening | ORAL | Status: DC | PRN
  Administered 2018-06-25: 50 mg via ORAL
  Filled 2018-06-24: qty 1

## 2018-06-24 MED ORDER — FLUTICASONE FUROATE-VILANTEROL 100-25 MCG/INH IN AEPB
1.0000 | INHALATION_SPRAY | Freq: Every day | RESPIRATORY_TRACT | Status: DC
  Administered 2018-06-25: 1 via RESPIRATORY_TRACT
  Filled 2018-06-24: qty 28

## 2018-06-24 MED ORDER — ACETAMINOPHEN 325 MG PO TABS: 650.0000 mg | ORAL_TABLET | ORAL | Status: DC | PRN

## 2018-06-24 MED ORDER — ENOXAPARIN SODIUM 40 MG/0.4ML ~~LOC~~ SOLN
40.0000 mg | SUBCUTANEOUS | Status: DC
Start: 2018-06-24 — End: 2018-06-25

## 2018-06-24 MED ORDER — ATORVASTATIN CALCIUM 20 MG PO TABS: 40.0000 mg | ORAL_TABLET | Freq: Every day | ORAL | Status: DC

## 2018-06-24 MED ORDER — STROKE: EARLY STAGES OF RECOVERY BOOK
Freq: Once | Status: AC
  Administered 2018-06-25

## 2018-06-24 MED ORDER — ACETAMINOPHEN 160 MG/5ML PO SOLN
650.0000 mg | ORAL | Status: DC | PRN
  Filled 2018-06-24: qty 20.3

## 2018-06-24 MED ORDER — PANTOPRAZOLE SODIUM 40 MG PO TBEC
40.0000 mg | DELAYED_RELEASE_TABLET | Freq: Every day | ORAL | Status: DC
  Administered 2018-06-25: 11:00:00 40 mg via ORAL
  Filled 2018-06-24: qty 1

## 2018-06-24 MED ORDER — FLUTICASONE PROPIONATE 50 MCG/ACT NA SUSP
2.0000 | Freq: Every day | NASAL | Status: DC
  Administered 2018-06-25: 11:00:00 2 via NASAL
  Filled 2018-06-24: qty 16

## 2018-06-24 MED ORDER — OLANZAPINE 10 MG PO TABS
10.0000 mg | ORAL_TABLET | Freq: Every evening | ORAL | Status: DC | PRN
  Administered 2018-06-25: 10 mg via ORAL
  Filled 2018-06-24 (×2): qty 1

## 2018-06-24 MED ORDER — DIVALPROEX SODIUM 500 MG PO DR TAB
1500.0000 mg | DELAYED_RELEASE_TABLET | Freq: Every day | ORAL | Status: DC
  Administered 2018-06-25: 02:00:00 1500 mg via ORAL
  Filled 2018-06-24 (×2): qty 3

## 2018-06-24 MED ORDER — ASPIRIN EC 81 MG PO TBEC
81.0000 mg | DELAYED_RELEASE_TABLET | Freq: Every day | ORAL | Status: DC
  Administered 2018-06-25: 11:00:00 81 mg via ORAL
  Filled 2018-06-24: qty 1

## 2018-06-24 NOTE — ED Provider Notes (Signed)
[facility] Emergency Department Provider Note ____________________________________________   First MD Initiated Contact with Patient 06/24/18 1828     (approximate)  I have reviewed the triage vital signs and the nursing notes.   HISTORY  Chief Complaint No chief complaint on file.  HPI Carly Marks is a 62 y.o. female history of CAD, schizophrenia on Zyprexa as well as Depakote who was presented to the emergency department today with a period of unresponsiveness 1 day ago.  The patient's daughter says that she was sitting when she became verbally unresponsive and started smacking her lips.  This went on for several minutes and the patient regained consciousness.  However, the patient was complaining of vague sensations of not feeling well today and the daughter brought her into the emergency department for further evaluation.  The daughter says that the patient has been more sedate over the past 1 to 2 years ever since starting the medicines for her psychiatric disorders.  She has brought this up multiple times with her psychiatrist and the psychiatrist has deemed these acceptable side effects to the medications.  Daughter is concerned at this time for possible stroke but says that since they arrived at the emergency department the patient has returned to her baseline mental status.   Past Medical History:  Diagnosis Date  . Anemia   . Anxiety   . Arthritis   . Asthma   . CAD (coronary artery disease)    per patient  . Depression   . Family history of colonic polyps   . GERD (gastroesophageal reflux disease)   . HA (headache)   . HTN (hypertension)   . Major depressive disorder, recurrent episode, moderate ([place])   . Obstructive sleep apnea     Patient Active Problem List   Diagnosis Date Noted  . Anxiety 10/08/2015  . Symptomatic bradycardia 10/08/2015  . CAD (coronary artery disease) 04/16/2015  . Chronic constipation 04/16/2015  . HTN  (hypertension) 04/16/2015  . Arthritis, degenerative 04/16/2015  . Allergic rhinitis, seasonal 04/16/2015  . Avitaminosis D 04/16/2015  . Depression, major, recurrent, moderate ([place]) 04/15/2015  . Atypical chest pain 06/26/2013  . Brash 06/26/2013  . Arthritis 06/26/2013  . Asthma, intermittent 01/17/2013  . Airway hyperreactivity 07/20/2011  . GERD (gastroesophageal reflux disease) 07/20/2011  . HCD (hypertensive cardiovascular disease) 07/20/2011    Past Surgical History:  Procedure Laterality Date  . CARDIAC CATHETERIZATION  02/19/15, 06/2009, 08/2011  . COLONOSCOPY N/A 04/22/2015   Procedure: COLONOSCOPY;  Surgeon: Hulen Luster, MD;  Location: [facility] ENDOSCOPY;  Service: Gastroenterology;  Laterality: N/A;  . TUBAL LIGATION      Prior to Admission medications   Medication Sig Start Date End Date Taking? Authorizing Provider  acetaminophen (TYLENOL) 500 MG tablet Take 1,000 mg by mouth every 4 (four) hours as needed.     [provider]  amLODipine-benazepril (LOTREL) 5-10 MG per capsule Take 1 capsule by mouth daily.  08/17/13   [provider]  aspirin 81 MG tablet Take 81 mg by mouth daily.    [provider]  atorvastatin (LIPITOR) 40 MG tablet Take 40 mg by mouth daily at 6 PM.  04/01/15   [provider]  budesonide-formoterol (SYMBICORT) 80-4.5 MCG/ACT inhaler twice a day 09/16/15   [provider]  budesonide-formoterol (SYMBICORT) 80-4.5 MCG/ACT inhaler Inhale 2 puffs into the lungs 2 (two) times daily.    [provider]  cholecalciferol (VITAMIN D) 400 UNITS TABS tablet Take 400 Units by  mouth daily.     [provider]  divalproex (DEPAKOTE) 500 MG DR tablet Take 500 mg by mouth at bedtime. 3 tabs nightly    [provider]  docusate sodium (COLACE) 100 MG capsule Take 100 mg by mouth 2 (two) times daily.     [provider]  doxycycline (DORYX) 100 MG EC tablet Take 100 mg by mouth 2 (two) times  daily.    [provider]  ferrous fumarate (HEMOCYTE - 106 MG FE) 325 (106 FE) MG TABS tablet Take 1 tablet by mouth daily.    [provider]  FLUoxetine (PROZAC) 20 MG capsule Take 3 capsules (60 mg total) by mouth daily. 07/29/15   Marjie Skiff, MD  fluticasone (FLONASE) 50 MCG/ACT nasal spray Place 2 sprays into the nose daily.    [provider]  furosemide (LASIX) 40 MG tablet Take 40 mg by mouth daily.    [provider]  gabapentin (NEURONTIN) 300 MG capsule Take 300 mg by mouth at bedtime.    [provider]  LORazepam (ATIVAN) 1 MG tablet Take 1 tablet (1 mg total) by mouth 2 (two) times daily. 09/27/15   Marjie Skiff, MD  Melatonin 3 MG TABS Take 1 tablet (3 mg total) by mouth at bedtime. 07/29/15   Marjie Skiff, MD  metFORMIN (GLUCOPHAGE) 500 MG tablet Take 500 mg by mouth 2 (two) times daily with a meal.    [provider]  metoprolol succinate (TOPROL-XL) 25 MG 24 hr tablet Take 1 tablet (25 mg total) by mouth at bedtime. 10/10/15   Loletha Grayer, MD  Nystatin [facility]) 100000 UNIT/GM POWD Apply 1 g topically.  05/17/15   [provider]  OLANZapine (ZYPREXA) 10 MG tablet Take 10 mg by mouth at bedtime.    [provider]  OLANZapine (ZYPREXA) 15 MG tablet Take 15 mg by mouth at bedtime.    [provider]  omeprazole (PRILOSEC) 20 MG capsule Take 20 mg by mouth daily.    [provider]  oxybutynin (DITROPAN) 5 MG tablet Take 5 mg by mouth daily.    [provider]  PATANOL 0.1 % ophthalmic solution Place 0.1 drops into both eyes daily. 05/19/13   [provider]  PEG 3350-KCl-NaBcb-NaCl-NaSulf (PEG-3350/ELECTROLYTES) 236 G SOLR  02/14/15   [provider]  spironolactone (ALDACTONE) 25 MG tablet  09/16/15   [provider]  traMADol (ULTRAM) 50 MG tablet Take 50 mg by mouth at bedtime as needed. One to 3 tabs    [provider]  traZODone  (DESYREL) 100 MG tablet Take 1 tablet (100 mg total) by mouth at bedtime as needed for sleep. Take 1 or 1-1/2 tablets at bedtime as needed for insomnia 07/29/15   Marjie Skiff, MD  traZODone (DESYREL) 50 MG tablet Take 50 mg by mouth at bedtime. 2 tabs    [provider]    Allergies Codeine; Dextroamphetamine; Nsaids; Morphine and related; and Penicillins  Family History  Problem Relation Age of Onset  . Hypertension Mother   . Diabetes Maternal Aunt   . Diabetes Maternal Uncle   . Hypertension Sister   . Breast cancer Sister 45  . Colon cancer Maternal Grandmother   . Hypertension Brother   . Depression Unknown   . Colon polyps Unknown     Social History Social History   Tobacco Use  . Smoking status: Never Smoker  . Smokeless tobacco: Never Used  Substance Use Topics  .  Alcohol use: No  . Drug use: No    Review of Systems  Constitutional: No fever/chills Eyes: No visual changes. ENT: No sore throat. Cardiovascular: Denies chest pain. Respiratory: Denies shortness of breath. Gastrointestinal: No abdominal pain.  No nausea, no vomiting.  No diarrhea.  No constipation. Genitourinary: Negative for dysuria. Musculoskeletal: Negative for back pain. Skin: Negative for rash. Neurological: Negative for headaches, focal weakness or numbness.   ____________________________________________   PHYSICAL EXAM:  VITAL SIGNS: ED Triage Vitals  Enc Vitals Group     BP 06/24/18 1521 120/71     Pulse Rate 06/24/18 1521 60     Resp 06/24/18 1521 16     Temp 06/24/18 1521 98.1 F (36.7 C)     Temp Source 06/24/18 1521 Oral     SpO2 06/24/18 1521 97 %     Weight 06/24/18 1522 250 lb (113.4 kg)     Height 06/24/18 1522 5\' 5"  (1.651 m)     Head Circumference --      Peak Flow --      Pain Score 06/24/18 1522 0     Pain Loc --      Pain Edu? --      Excl. in [place]? --     Constitutional: Alert and oriented. Well appearing and in no acute distress. Eyes:  Conjunctivae are normal.  Head: Atraumatic. Nose: No congestion/rhinnorhea. Mouth/Throat: Mucous membranes are moist.  Neck: No stridor.   Cardiovascular: Normal rate, regular rhythm. Grossly normal heart sounds.   Respiratory: Normal respiratory effort.  No retractions. Lungs CTAB. Gastrointestinal: Soft and nontender. No distention.  Musculoskeletal: No lower extremity tenderness nor edema.  No joint effusions. Neurologic:  Normal speech and language. No gross focal neurologic deficits are appreciated. Skin:  Skin is warm, dry and intact. No rash noted. Psychiatric: Mood and affect are normal. Speech and behavior are normal.  ____________________________________________   LABS (all labs ordered are listed, but only abnormal results are displayed)  Labs Reviewed  BASIC METABOLIC PANEL - Abnormal; Notable for the following components:      Result Value   Creatinine, Ser 1.18 (*)    GFR calc non Af Amer 48 (*)    GFR calc Af Amer 56 (*)    All other components within normal limits  CBC - Abnormal; Notable for the following components:   RBC 5.81 (*)    MCV 73.8 (*)    MCH 24.1 (*)    RDW 16.7 (*)    All other components within normal limits  TROPONIN I  URINALYSIS, COMPLETE (UACMP) WITH MICROSCOPIC  VALPROIC ACID LEVEL   ____________________________________________  EKG  ED ECG REPORT I, Doran Stabler, the attending physician, personally viewed and interpreted this ECG.   Date: 06/24/2018  EKG Time: 2017  Rate: 55  Rhythm: sinus bradycardia  Axis: Normal  Intervals:nonspecific intraventricular conduction delay  ST&T Change: No ST segment elevation or depression.  No abnormal T wave inversion.  ____________________________________________  RADIOLOGY  Wedge-shaped area of low density in the left cerebral hemisphere posterior watershed distribution compatible with an acute or subacute  infarct. ____________________________________________   PROCEDURES  Procedure(s) performed:   Procedures  Critical Care performed:   ____________________________________________   INITIAL IMPRESSION / ASSESSMENT AND PLAN / ED COURSE  Pertinent labs & imaging results that were available during my care of the patient were reviewed by me and considered in my medical decision making (see chart for details).  Differential diagnosis includes, but is not limited  to, alcohol, illicit or prescription medications, or other toxic ingestion; intracranial pathology such as stroke or intracerebral hemorrhage; fever or infectious causes including sepsis; hypoxemia and/or hypercarbia; uremia; trauma; endocrine related disorders such as diabetes, hypoglycemia, and thyroid-related diseases; hypertensive encephalopathy; etc. As part of my medical decision making, I reviewed the following data within the electronic MEDICAL RECORD NUMBER Notes from prior ED visits  ----------------------------------------- 8:40 PM on 06/24/2018 -----------------------------------------  Patient and family updated as to the CT findings.  Patient to be admitted to the hospital.  Patient already took 81 mg of aspirin this morning.  Patient to have 243 mg of aspirin here to be admitted.  Patient without focal finding on neurologic exam at this time.  Not a TPA nor a interventional candidate at this time.  Clinically, the patient's symptoms have appeared to have resolved. ____________________________________________   FINAL CLINICAL IMPRESSION(S) / ED DIAGNOSES  CVA.  NEW MEDICATIONS STARTED DURING THIS VISIT:  New Prescriptions   No medications on file     Note:  This document was prepared using Dragon voice recognition software and may include unintentional dictation errors.     Orbie Pyo, MD 06/24/18 2041

## 2018-06-24 NOTE — ED Notes (Signed)
John RN, aware of bed assigned  

## 2018-06-24 NOTE — ED Triage Notes (Signed)
Pt presents to ED via POV with family. Family reports pt has "spells" where she will be talking and engaging with family normally and then just stare off into space for a minute and is completely unaware of people speaking to her. Has being going on for 1-2 years.

## 2018-06-24 NOTE — H&P (Signed)
[facility] [place] NAME: Carly Marks    MR#:  [id number]  DATE OF BIRTH:  February 14, 1956  DATE OF ADMISSION:  06/24/2018  PRIMARY CARE PHYSICIAN: Ellamae Sia, MD   REQUESTING/REFERRING PHYSICIAN:   CHIEF COMPLAINT:  No chief complaint on file.   HISTORY OF PRESENT ILLNESS: Carly Marks  is a 62 y.o. female with a known history of coronary artery disease, schizophrenia, major depressive disorder, OSA and other comorbidities. Patient was brought to emergency room for an episode of unresponsiveness noted by the family at home.  Per daughter, patient was sitting in a chair, comfortably, talking, when suddenly she became verbally unresponsive and started smacking her lips.  This went on for approximately 3 to 4-minute until the patient regained consciousness.  Patient complains of not feeling well for the past 24 hours, although she cannot describe exactly what is bothering her, except that she feels tired and generally weak.  Per family, patient has been more tired and sedated in the past years since she was started on psych medications.  No reports of fever or chills at home; no chest pain, no nausea, vomiting, bleeding. Blood test done emergency room, including CBC and CMP, are grossly unremarkable. CT scan of the brain shows wedge-shaped area of low density in the left cerebral hemisphere posterior watershed distribution, compatible with an acute or subacute infarct.    PAST MEDICAL HISTORY:   Past Medical History:  Diagnosis Date  . Anemia   . Anxiety   . Arthritis   . Asthma   . CAD (coronary artery disease)    per patient  . Depression   . Family history of colonic polyps   . GERD (gastroesophageal reflux disease)   . HA (headache)   . HTN (hypertension)   . Major depressive disorder, recurrent episode, moderate ([place])   . Obstructive sleep apnea     PAST SURGICAL HISTORY:  Past Surgical History:  Procedure  Laterality Date  . CARDIAC CATHETERIZATION  02/19/15, 06/2009, 08/2011  . COLONOSCOPY N/A 04/22/2015   Procedure: COLONOSCOPY;  Surgeon: Hulen Luster, MD;  Location: [facility] ENDOSCOPY;  Service: Gastroenterology;  Laterality: N/A;  . TUBAL LIGATION      SOCIAL HISTORY:  Social History   Tobacco Use  . Smoking status: Never Smoker  . Smokeless tobacco: Never Used  Substance Use Topics  . Alcohol use: No    FAMILY HISTORY:  Family History  Problem Relation Age of Onset  . Hypertension Mother   . Diabetes Maternal Aunt   . Diabetes Maternal Uncle   . Hypertension Sister   . Breast cancer Sister 11  . Colon cancer Maternal Grandmother   . Hypertension Brother   . Depression Unknown   . Colon polyps Unknown     DRUG ALLERGIES:  Allergies  Allergen Reactions  . Codeine   . Dextroamphetamine   . Nsaids   . Morphine And Related Other (See Comments)    palpitations   . Penicillins Other (See Comments)    Palpitations  Has patient had a PCN reaction causing immediate rash, facial/tongue/throat swelling, SOB or lightheadedness with hypotension: Yes Has patient had a PCN reaction causing severe rash involving mucus membranes or skin necrosis: No Has patient had a PCN reaction that required hospitalization: No Has patient had a PCN reaction occurring within the last 10 years: No If all of the above answers are "NO", then may proceed with Cephalosporin use.  REVIEW OF SYSTEMS:   CONSTITUTIONAL: No fever, the patient complains of fatigue and generalized weakness.  EYES: No changes in vision.  EARS, NOSE, AND THROAT: No tinnitus or ear pain.  RESPIRATORY: No cough, shortness of breath, wheezing or hemoptysis.  CARDIOVASCULAR: No chest pain, orthopnea, edema.  GASTROINTESTINAL: No nausea, vomiting, diarrhea or abdominal pain.  GENITOURINARY: No dysuria, hematuria.  ENDOCRINE: No polyuria, nocturia. HEMATOLOGY: No bleeding. SKIN: No rash or lesion. MUSCULOSKELETAL: No joint pain  at this time.   NEUROLOGIC: No focal weakness.  PSYCHIATRY: Positive history of schizophrenia and depression.   MEDICATIONS AT HOME:  Prior to Admission medications   Medication Sig Start Date End Date Taking? Authorizing Provider  amLODipine-benazepril (LOTREL) 5-10 MG per capsule Take 1 capsule by mouth daily.  08/17/13  Yes [provider]  aspirin 81 MG tablet Take 81 mg by mouth daily.   Yes [provider]  atorvastatin (LIPITOR) 40 MG tablet Take 40 mg by mouth daily at 6 PM.  04/01/15  Yes [provider]  cholecalciferol (VITAMIN D) 400 UNITS TABS tablet Take 400 Units by mouth daily.    Yes [provider]  divalproex (DEPAKOTE) 500 MG DR tablet Take 1,500 mg by mouth at bedtime.    Yes [provider]  docusate sodium (COLACE) 100 MG capsule Take 100 mg by mouth 2 (two) times daily.    Yes [provider]  OLANZapine (ZYPREXA) 10 MG tablet Take 10 mg by mouth at bedtime.   Yes [provider]  omeprazole (PRILOSEC) 20 MG capsule Take 20 mg by mouth daily.   Yes [provider]  traZODone (DESYREL) 100 MG tablet Take 1 tablet (100 mg total) by mouth at bedtime as needed for sleep. Take 1 or 1-1/2 tablets at bedtime as needed for insomnia Patient taking differently: Take 50 mg by mouth at bedtime as needed for sleep.  07/29/15  Yes Marjie Skiff, MD  acetaminophen (TYLENOL) 500 MG tablet Take 1,000 mg by mouth every 4 (four) hours as needed.     [provider]  budesonide-formoterol (SYMBICORT) 80-4.5 MCG/ACT inhaler Inhale 2 puffs into the lungs daily as needed.  09/16/15   [provider]  FLUoxetine (PROZAC) 20 MG capsule Take 3 capsules (60 mg total) by mouth daily. Patient not taking: Reported on 06/24/2018 07/29/15   Marjie Skiff, MD  fluticasone [facility]) 50 MCG/ACT nasal spray Place 2 sprays into the nose daily.    [provider]  LORazepam (ATIVAN) 1 MG tablet Take 1 tablet  (1 mg total) by mouth 2 (two) times daily. Patient not taking: Reported on 06/24/2018 09/27/15   Marjie Skiff, MD  Melatonin 3 MG TABS Take 1 tablet (3 mg total) by mouth at bedtime. Patient not taking: Reported on 06/24/2018 07/29/15   Marjie Skiff, MD  metoprolol succinate (TOPROL-XL) 25 MG 24 hr tablet Take 1 tablet (25 mg total) by mouth at bedtime. Patient not taking: Reported on 06/24/2018 10/10/15   Loletha Grayer, MD  Nystatin [facility]) 100000 UNIT/GM POWD Apply 1 g topically daily as needed.  05/17/15   [provider]  oxybutynin (DITROPAN) 5 MG tablet Take 5 mg by mouth daily as needed for bladder spasms.     [provider]      PHYSICAL EXAMINATION:   VITAL SIGNS: Blood pressure (!) 144/72, pulse (!) 51, temperature 98.1 F (36.7 C), temperature source Oral, resp. rate 16, height 5\' 5"  (1.651 m), weight 113.4 kg, SpO2  100 %.  GENERAL:  62 y.o.-year-old patient lying in the bed with no acute distress.  EYES: Pupils equal, round, reactive to light and accommodation. No scleral icterus. Extraocular muscles intact.  HEENT: Head atraumatic, normocephalic. Oropharynx and nasopharynx clear.  NECK:  Supple, no jugular venous distention. No thyroid enlargement, no tenderness.  LUNGS: Normal breath sounds bilaterally, no wheezing, rales,rhonchi or crepitation. No use of accessory muscles of respiration.  CARDIOVASCULAR: S1, S2 normal. No S3/S4.  ABDOMEN: Soft, nontender, nondistended. Bowel sounds present. No organomegaly or mass.  EXTREMITIES: No pedal edema, cyanosis, or clubbing.  NEUROLOGIC: Cranial nerves II through XII are intact. Muscle strength 5/5 in all extremities. Sensation intact.   PSYCHIATRIC: The patient is alert and oriented x 3.  SKIN: No obvious rash, lesion, or ulcer.   LABORATORY PANEL:   CBC Recent Labs  Lab 06/24/18 1525  WBC 5.1  HGB 14.0  HCT 42.9  PLT 173  MCV 73.8*  MCH 24.1*  MCHC 32.6  RDW 16.7*    ------------------------------------------------------------------------------------------------------------------  Chemistries  Recent Labs  Lab 06/24/18 1525  NA 141  K 4.0  CL 104  CO2 28  GLUCOSE 84  BUN 13  CREATININE 1.18*  CALCIUM 9.2   ------------------------------------------------------------------------------------------------------------------ estimated creatinine clearance is 62.1 mL/min (A) (by C-G formula based on SCr of 1.18 mg/dL (H)). ------------------------------------------------------------------------------------------------------------------ No results for input(s): TSH, T4TOTAL, T3FREE, THYROIDAB in the last 72 hours.  Invalid input(s): FREET3   Coagulation profile No results for input(s): INR, PROTIME in the last 168 hours. ------------------------------------------------------------------------------------------------------------------- No results for input(s): DDIMER in the last 72 hours. -------------------------------------------------------------------------------------------------------------------  Cardiac Enzymes Recent Labs  Lab 06/24/18 1525  TROPONINI <0.03   ------------------------------------------------------------------------------------------------------------------ Invalid input(s): POCBNP  ---------------------------------------------------------------------------------------------------------------  Urinalysis    Component Value Date/Time   COLORURINE YELLOW (A) 06/24/2018 2029   APPEARANCEUR CLEAR (A) 06/24/2018 2029   APPEARANCEUR Cloudy 10/24/2014 1544   LABSPEC 1.013 06/24/2018 2029   LABSPEC 1.014 10/24/2014 1544   PHURINE 6.0 06/24/2018 2029   GLUCOSEU NEGATIVE 06/24/2018 2029   GLUCOSEU Negative 10/24/2014 1544   HGBUR NEGATIVE 06/24/2018 2029   [place] NEGATIVE 06/24/2018 2029   BILIRUBINUR Negative 10/24/2014 [place] 06/24/2018 2029   PROTEINUR NEGATIVE 06/24/2018 2029   NITRITE  NEGATIVE 06/24/2018 2029   LEUKOCYTESUR TRACE (A) 06/24/2018 2029   LEUKOCYTESUR 3+ 10/24/2014 1544     RADIOLOGY: Ct Head Wo Contrast  Result Date: 06/24/2018 CLINICAL DATA:  Alter mental status for the past 1-2 years, worse for the past 2 days. EXAM: CT HEAD WITHOUT CONTRAST TECHNIQUE: Contiguous axial images were obtained from the base of the skull through the vertex without intravenous contrast. COMPARISON:  01/22/2014. FINDINGS: Brain: Diffusely enlarged ventricles and subarachnoid spaces. Patchy white matter low density in both cerebral hemispheres. Wedge-shaped area of low density in the left parietooccipital region. This is involving the white matter and cortex with some cortical sparing. No intracranial hemorrhage or mass effect. Vascular: No hyperdense vessel or unexpected calcification. Skull: Normal. Negative for fracture or focal lesion. Sinuses/Orbits: Mild right maxillary sinus mucosal thickening. Unremarkable orbits. Other: Right concha bullosa. IMPRESSION: 1. Wedge-shaped area of low density in the left cerebral hemisphere posterior watershed distribution, compatible with an acute or subacute infarct. Further evaluation with pre and postcontrast magnetic resonance imaging of the brain is recommended for age determination and to determine there is any other underlying process. 2. Interval mild diffuse cerebral and cerebellar atrophy and mild chronic small vessel white matter ischemic changes in both cerebral hemispheres.  Electronically Signed   By: Claudie Revering M.D.   On: 06/24/2018 20:07    EKG: Orders placed or performed during the hospital encounter of 06/24/18  . ED EKG  . ED EKG  . EKG 12-Lead  . EKG 12-Lead    IMPRESSION AND PLAN:  1.  Acute ischemic stroke.  Patient is currently clinically back to baseline, without focal neurologic deficits.  We will start aspirin.  We will hold BP meds, to allow permissive hypertension. Will check MRI of the brain for age determination  and to rule out any other underlying process.  We will start further work-up to identify stroke etiology.  Neurology is consulted for further evaluation and treatment.  2.  Schizophrenia.  Will check Depakote level.  Will lower Zyprexa dose, due to increased sedation as a side effect. 3.  Hypertension.  BP is currently stable at 144/72.  Will hold BP meds to rule out permissive hypertension, given the recent stroke.  All the records are reviewed and case discussed with ED provider. Management plans discussed with the patient, family and they are in agreement.  CODE STATUS: Full Code Status History    Date Active Date Inactive Code Status Order ID Comments User Context   10/09/2015 0018 10/10/2015 1558 Full Code [id number]  Lance Coon, MD ED       TOTAL TIME TAKING CARE OF THIS PATIENT: 45 minutes.    Amelia Jo M.D on 06/24/2018 at 9:25 PM  Between 7am to 6pm - Pager - [phone]  After 6pm go to [email or web address] - password [email or web address] [facility] [place] [facility]  [phone]  CC: Primary care physician; Ellamae Sia, MD

## 2018-06-25 ENCOUNTER — Inpatient Hospital Stay: Payer: Medicare Other

## 2018-06-25 ENCOUNTER — Inpatient Hospital Stay
Admit: 2018-06-25 | Discharge: 2018-06-25 | Disposition: A | Discharge status: Home / Self Care | Payer: Medicare Other | Attending: Internal Medicine | Admitting: Internal Medicine

## 2018-06-25 DIAGNOSIS — G40209 Localization-related (focal) (partial) symptomatic epilepsy and epileptic syndromes with complex partial seizures, not intractable, without status epilepticus: Secondary | ICD-10-CM | POA: Diagnosis not present

## 2018-06-25 DIAGNOSIS — G9389 Other specified disorders of brain: Secondary | ICD-10-CM | POA: Diagnosis not present

## 2018-06-25 DIAGNOSIS — I639 Cerebral infarction, unspecified: Secondary | ICD-10-CM | POA: Diagnosis present

## 2018-06-25 LAB — VALPROIC ACID LEVEL: VALPROIC ACID LVL: 52 ug/mL (ref 50.0–100.0)

## 2018-06-25 LAB — LIPID PANEL
CHOLESTEROL: 108 mg/dL (ref 0–200)
HDL: 41 mg/dL (ref >40)
LDL CALC: 62 mg/dL (ref 0–99)
Total CHOL/HDL Ratio: 2.6 RATIO
Triglycerides: 27 mg/dL (ref <150)
VLDL: 5 mg/dL (ref 0–40)

## 2018-06-25 LAB — ECHOCARDIOGRAM COMPLETE
Height: 65 in
WEIGHTICAEL: 4000.03 [oz_av]

## 2018-06-25 LAB — HEMOGLOBIN A1C
Hgb A1c MFr Bld: 4.9 % (ref 4.8–5.6)
Mean Plasma Glucose: 93.93 mg/dL

## 2018-06-25 MED ORDER — BENAZEPRIL HCL 10 MG PO TABS
10.0000 mg | ORAL_TABLET | Freq: Every day | ORAL | Status: DC
  Filled 2018-06-25: qty 1

## 2018-06-25 MED ORDER — ENOXAPARIN SODIUM 40 MG/0.4ML ~~LOC~~ SOLN: 40.0000 mg | SUBCUTANEOUS | Status: DC

## 2018-06-25 MED ORDER — OLANZAPINE 5 MG PO TABS
5.0000 mg | ORAL_TABLET | Freq: Every evening | ORAL | Status: DC | PRN
  Filled 2018-06-25: qty 1

## 2018-06-25 MED ORDER — AMLODIPINE BESYLATE 5 MG PO TABS: 5.0000 mg | ORAL_TABLET | Freq: Every day | ORAL | Status: DC

## 2018-06-25 MED ORDER — METOPROLOL SUCCINATE ER 25 MG PO TB24: 25.0000 mg | ORAL_TABLET | Freq: Every day | ORAL | Status: DC

## 2018-06-25 MED ORDER — DIVALPROEX SODIUM 250 MG PO DR TAB
750.0000 mg | DELAYED_RELEASE_TABLET | Freq: Two times a day (BID) | ORAL | 0 refills | Status: AC
Start: 2018-06-25 — End: ?

## 2018-06-25 MED ORDER — AMLODIPINE BESY-BENAZEPRIL HCL 5-10 MG PO CAPS: 1.0000 | ORAL_CAPSULE | Freq: Every day | ORAL | Status: DC

## 2018-06-25 NOTE — Consult Note (Signed)
STROKE TEAM PROGRESS NOTE    HPI (From chart): Carly Marks  is a 62 y.o. female with a known history of coronary artery disease, schizophrenia, major depressive disorder, OSA and other comorbidities. Patient was brought to emergency room for an episode of unresponsiveness noted by the family at home.  Per daughter, patient was sitting in a chair, comfortably, talking, when suddenly she became verbally unresponsive and started smacking her lips. This went on for approximately 3 to 4-minute until the patient regained consciousness.  Patient complains of not feeling well for the past 24 hours, although she cannot describe exactly what is bothering her, except that she feels tired and generally weak.  Per family, patient has been more tired and sedated in the past years since she was started on psych medications.  No reports of fever or chills at home; no chest pain, no nausea, vomiting, bleeding. Blood test done emergency room, including CBC and CMP, are grossly unremarkable. CT scan of the brain shows wedge-shaped area of low density in the left cerebral hemisphere posterior watershed distribution, compatible with an acute or subacute infarct. MRI shows no acute changes. At time of OT evaluation pt is awaiting carotid dopplers and neurology consult.     SUBJECTIVE (INTERVAL HISTORY) Her family is not at bedside.   OBJECTIVE Vitals:   06/25/18 0203 06/25/18 0403 06/25/18 0627 06/25/18 1251  BP: 116/61 127/63 116/63 (!) 126/49  Pulse: (!) 58 (!) 50 (!) 54 69  Resp: 18 18 16 18   Temp: (!) 97.5 F (36.4 C) (!) 97.5 F (36.4 C) 97.9 F (36.6 C) 98.6 F (37 C)  TempSrc: Oral Oral Oral   SpO2: 98% 98% 100% 97%  Weight:      Height:        CBC:  Recent Labs  Lab 06/24/18 1525  WBC 5.1  HGB 14.0  HCT 42.9  MCV 73.8*  PLT 341    Basic Metabolic Panel:  Recent Labs  Lab 06/24/18 1525  NA 141  K 4.0  CL 104  CO2 28  GLUCOSE 84  BUN 13  CREATININE 1.18*  CALCIUM 9.2    Lipid  Panel:     Component Value Date/Time   CHOL 108 06/25/2018 0442   CHOL 171 02/04/2015 0251   TRIG 27 06/25/2018 0442   TRIG 41 02/04/2015 0251   HDL 41 06/25/2018 0442   HDL 63 02/04/2015 0251   CHOLHDL 2.6 06/25/2018 0442   VLDL 5 06/25/2018 0442   VLDL 8 02/04/2015 0251   LDLCALC 62 06/25/2018 0442   LDLCALC 100 (H) 02/04/2015 0251   HgbA1c:  Lab Results  Component Value Date   HGBA1C 4.9 06/25/2018   Urine Drug Screen:     Component Value Date/Time   LABOPIA NEGATIVE 01/18/2014 1607   COCAINSCRNUR NEGATIVE 01/18/2014 1607   LABBENZ NEGATIVE 01/18/2014 1607   AMPHETMU NEGATIVE 01/18/2014 1607   THCU NEGATIVE 01/18/2014 1607   LABBARB NEGATIVE 01/18/2014 1607    Alcohol Level No results found for: ETH  IMAGING  Ct Head Wo Contrast  Result Date: 06/24/2018 CLINICAL DATA:  Alter mental status for the past 1-2 years, worse for the past 2 days. EXAM: CT HEAD WITHOUT CONTRAST TECHNIQUE: Contiguous axial images were obtained from the base of the skull through the vertex without intravenous contrast. COMPARISON:  01/22/2014. FINDINGS: Brain: Diffusely enlarged ventricles and subarachnoid spaces. Patchy white matter low density in both cerebral hemispheres. Wedge-shaped area of low density in the left parietooccipital region. This is  involving the white matter and cortex with some cortical sparing. No intracranial hemorrhage or mass effect. Vascular: No hyperdense vessel or unexpected calcification. Skull: Normal. Negative for fracture or focal lesion. Sinuses/Orbits: Mild right maxillary sinus mucosal thickening. Unremarkable orbits. Other: Right concha bullosa. IMPRESSION: 1. Wedge-shaped area of low density in the left cerebral hemisphere posterior watershed distribution, compatible with an acute or subacute infarct. Further evaluation with pre and postcontrast magnetic resonance imaging of the brain is recommended for age determination and to determine there is any other underlying  process. 2. Interval mild diffuse cerebral and cerebellar atrophy and mild chronic small vessel white matter ischemic changes in both cerebral hemispheres. Electronically Signed   By: Claudie Revering M.D.   On: 06/24/2018 20:07   Mr Brain Wo Contrast  Result Date: 06/25/2018 CLINICAL DATA:  Initial evaluation for acute unresponsiveness, concern for stroke. EXAM: MRI HEAD WITHOUT CONTRAST TECHNIQUE: Multiplanar, multiecho pulse sequences of the brain and surrounding structures were obtained without intravenous contrast. COMPARISON:  Prior CT from 06/24/2018 FINDINGS: Brain: Cerebral volume within normal limits for age. Patchy T2/FLAIR hyperintensity within the periventricular deep white matter both cerebral hemispheres, most consistent with chronic small vessel ischemic disease. Encephalomalacia with gliosis within the left parieto-occipital region consistent with remote infarct. Scattered remote right cerebellar infarcts present as well. Few additional tiny remote left cerebellar infarcts. No abnormal foci of restricted diffusion to suggest acute or subacute ischemia. Gray-white matter differentiation otherwise maintained. No evidence for acute or chronic intracranial hemorrhage. No mass lesion, midline shift or mass effect. No hydrocephalus. No extra-axial fluid collection. Incidental note made of an empty sella. Vascular: Major intracranial vascular flow voids are maintained Skull and upper cervical spine: Craniocervical junction normal. Upper cervical spine normal. No focal marrow replacing lesion. Scalp soft tissues unremarkable. Sinuses/Orbits: Globes and orbital soft tissues within normal limits. Scattered mucosal thickening throughout the ethmoidal air cells and maxillary sinuses as well as the sphenoid sinuses. Left maxillary sinus retention cyst. No significant mastoid effusion. Other: None. IMPRESSION: 1. No acute intracranial abnormality. 2. Remote left parieto-occipital infarct, with additional remote  bilateral cerebellar infarcts, right greater than left. 3. Moderate chronic small vessel ischemic disease. Electronically Signed   By: Jeannine Boga M.D.   On: 06/25/2018 03:15   US Carotid Bilateral (at [facility] And [facility] Only)  Result Date: 06/25/2018 CLINICAL DATA:  History of unresponsiveness.  Concern for stroke. EXAM: BILATERAL CAROTID DUPLEX ULTRASOUND TECHNIQUE: Pearline Cables scale imaging, color Doppler and duplex ultrasound were performed of bilateral carotid and vertebral arteries in the neck. COMPARISON:  Carotid study 06/15/2009 FINDINGS: Criteria: Quantification of carotid stenosis is based on velocity parameters that correlate the residual internal carotid diameter with NASCET-based stenosis levels, using the diameter of the distal internal carotid lumen as the denominator for stenosis measurement. The following velocity measurements were obtained: RIGHT ICA: 98 cm/sec CCA: 82 cm/sec SYSTOLIC ICA/CCA RATIO:  1.2 ECA: 55 cm/sec LEFT ICA: 84 cm/sec CCA: 87 cm/sec SYSTOLIC ICA/CCA RATIO:  0.9 ECA: 48 cm/sec RIGHT CAROTID ARTERY: Right carotid arteries are patent without significant plaque or stenosis. External carotid artery is patent with normal waveform. Normal waveforms and velocities in the internal carotid artery. RIGHT VERTEBRAL ARTERY: Antegrade flow and normal waveform in the right vertebral artery. LEFT CAROTID ARTERY: Small focus of echogenic plaque at the left carotid bulb and proximal internal carotid artery region. External carotid artery is patent with normal waveform. Normal waveforms and velocities in the internal carotid artery. LEFT VERTEBRAL ARTERY: Antegrade flow and normal  waveform in the left vertebral artery. IMPRESSION: No hemodynamically significant carotid artery stenosis. Minimal plaque in the proximal left internal carotid artery. Estimated degree of stenosis in left internal carotid artery is less than 50%. Patent vertebral arteries with antegrade flow. Electronically Signed   By:  Markus Daft M.D.   On: 06/25/2018 09:59       PHYSICAL EXAM  Physical exam: Exam: Gen: NAD, oriented to self, month, year, doesn't remember who the president is                    CV: RRR, no MRG. No Carotid Bruits. No peripheral edema, warm, nontender Eyes: Conjunctivae clear without exudates or hemorrhage  Neuro: Detailed Neurologic Exam  Speech:    Speech is normal; fluent and spontaneous Cognition:    NAD, oriented to self, month, year, doesn't remember who the president is    Poor historian Cranial Nerves:    The pupils are equal, round, and reactive to light. The fundi are normal and spontaneous venous pulsations are present. Visual fields are full to finger confrontation. Extraocular movements are intact. Trigeminal sensation is intact and the muscles of mastication are normal. The face is symmetric. The palate elevates in the midline. Hearing intact. Voice is normal. Shoulder shrug is normal. The tongue has normal motion without fasciculations.   Coordination:    No dysmetria  Gait:    deferred  Motor Observation:    No asymmetry, no atrophy, and no involuntary movements noted. Tone:    Normal muscle tone.    Posture:    Posture is normal in bed    Strength:    Poor effort is symmetric, no focal weakness noted, no drift     Sensation: intact to LT     Reflex Exam:  DTR's:    Deep tendon reflexes in the upper and lower extremities are symmetrical bilaterally.   Toes:    The toes are equiv bilaterally.   Clonus:    Clonus is absent.    ASSESSMENT/PLAN 62 y.o. female with a known history of coronary artery disease, schizophrenia, major depressive disorder, OSA, stroke and other comorbidities. She had an episode of unresponsiveness and smacking her lips.   - Patient reports this has happened in the past. Sounds more like a seizure, she had previous stroke which could be a seizure focus. Recommend changing her Depakote to ER as taking Depakote DR once daily  may not be effective if seizures. Or change her Depakote DR to 750mg  BID. Also needs outpatient workup with neurology.  - MRI shows remote infarct No acute event.: . Encephalomalacia with gliosis within the left parieto-occipital. No acute events. Scattered remote right cerebellar infarcts present as well. Few additional tiny remote left cerebellar infarcts. Chronic small vessel disease.  - US Carotids: No hemodynamically significant carotid artery stenosis. - LDL: 62 continue statin - hgba1c 4.9 - Untreated OSA is a stroke risk factor, she is not on cpap - pending 2D echo - goal BP normotensive - Patient was on ASA 81mg  daily prior to event, continue - she does not drive  Personally examined patient and images, and have participated in and made any corrections needed to history, physical, neuro exam,assessment and plan as stated above.  I have personally obtained the history, evaluated lab date, reviewed imaging studies and agree with radiology interpretations.   Neurology will sign off at this time, thanks  Sarina Ill, MD

## 2018-06-25 NOTE — Progress Notes (Signed)
Chart reviewed. Pt is able to communicate needs and wants without difficulty. No dysphagia reported with current diet. Family in room, reported Pt is back to baseline with the exception of a occasional delayed response to questions. Pt was falling asleep during ST screen, possibly medication related. St to f/u Monday with Nsg to ensure no speech is back to baseline. No ST needs at this time.

## 2018-06-25 NOTE — Care Management CC44 (Signed)
Condition Code 44 Documentation Completed  Patient Details  Name: Carly Marks MRN: [id number] Date of Birth: [birth year]   Condition Code 44 given:  Yes Patient signature on Condition Code 44 notice:  Yes Documentation of 2 MD's agreement:  Yes Code 44 added to claim:  Yes    Latanya Maudlin, RN 06/25/2018, 3:34 PM

## 2018-06-25 NOTE — Care Management CC44 (Deleted)
Condition Code 44 Documentation Completed  Patient Details  Name: Carly Marks MRN: [id number] Date of Birth: July 13, 1956   Condition Code 44 given:   yes Patient signature on Condition Code 44 notice:   yes Documentation of 2 MD's agreement:   yes Code 44 added to claim:   yes     Latanya Maudlin, RN 06/25/2018, 3:18 PM

## 2018-06-25 NOTE — Progress Notes (Signed)
Physical Therapy Evaluation Patient Details Name: Carly Marks MRN: [id number] DOB: January 15, 1956 Today's Date: 06/25/2018   History of Present Illness  Carly Marks  is a 62 y.o. female with a known history of coronary artery disease, schizophrenia, major depressive disorder, OSA and other comorbidities. Patient was brought to emergency room for an episode of unresponsiveness noted by the family at home.  Per daughter, patient was sitting in a chair, comfortably, talking, when suddenly she became verbally unresponsive and started smacking her lips. This went on for approximately 3 to 4-minute until the patient regained consciousness.  Patient complains of not feeling well for the past 24 hours, although she cannot describe exactly what is bothering her, except that she feels tired and generally weak.  Per family, patient has been more tired and sedated in the past years since she was started on psych medications.  No reports of fever or chills at home; no chest pain, no nausea, vomiting, bleeding. Blood test done emergency room, including CBC and CMP, are grossly unremarkable. CT scan of the brain shows wedge-shaped area of low density in the left cerebral hemisphere posterior watershed distribution, compatible with an acute or subacute infarct. MRI shows no acute changes. At time of PT evaluation pt is awaiting carotid dopplers and neurology consult.   Clinical Impression  Pt admitted with above diagnosis. Pt currently with functional limitations due to the deficits listed below (see PT Problem List). Pt is independent with bed mobility and CGA only for transfers and ambulation. Pt demonstrates safe hand placement and good stability with transfers. She is able to complete two laps around RN station with therapist. CGA only but no external support required from therapist. No LOB with ambulation. Gait speed is decreased but functional for limited community mobility. Pt reports minimal fatigue with  ambulation but denies DOE. SaO2 99% and HR increases to 101 with ambulation. She is able to maintain feet apart and together without UE support. Negative Romberg. Single leg balance is 1-2s on each LE. Pt reports she is back to baseline and she is safe with all mobility today. She does have some higher level balance deficits which is likely her baseline. No recommendation for PT following discharge at this time. Will follow pt during admission to ensure continued mobility.       Follow Up Recommendations No PT follow up;Other (comment)(Pt is back at baseline)    Equipment Recommendations  None recommended by PT;Other (comment)(Encouraged to use her rollator at discharge)    Recommendations for Other Services       Precautions / Restrictions Precautions Precautions: Fall Restrictions Weight Bearing Restrictions: No      Mobility  Bed Mobility Overal bed mobility: Modified Independent             General bed mobility comments: HOB elevated and use of bed rails  Transfers Overall transfer level: Needs assistance Equipment used: Rolling walker (2 wheeled) Transfers: Sit to/from Stand Sit to Stand: Min guard         General transfer comment: Pt demonstrates safe hand placement and good stability with transfers.  Ambulation/Gait Ambulation/Gait assistance: Min guard Gait Distance (Feet): 400 Feet Assistive device: Rolling walker (2 wheeled)       General Gait Details: Pt is able to complete two laps around RN station with therapist. CGA only but no external support required from therapist. No LOB with ambulation. Gait speed is decreased but functional for limited community mobility. Pt reports minimal fatigue with ambulation but denies DOE.  SaO2 99% and HR increases to 101 with ambulation.   Stairs            Wheelchair Mobility    Modified Rankin (Stroke Patients Only)       Balance Overall balance assessment: Needs assistance Sitting-balance support: No  upper extremity supported Sitting balance-Leahy Scale: Good     Standing balance support: No upper extremity supported Standing balance-Leahy Scale: Fair Standing balance comment: Able to maintain feet apart and together without UE support. Negative Romberg. Single leg balance is 1-2s on each LE                             Pertinent Vitals/Pain Pain Assessment: No/denies pain    Home Living Family/patient expects to be discharged to:: Private residence Living Arrangements: Children Available Help at Discharge: Family Type of Home: House Home Access: Stairs to enter Entrance Stairs-Rails: Can reach both Entrance Stairs-Number of Steps: 4 Home Layout: One level Home Equipment: [place] - 4 wheels;Cane - quad;Cane - single point;Shower seat      Prior Function Level of Independence: Needs assistance   Gait / Transfers Assistance Needed: Pt ambulates with single point cane and has had one fall in the last 12 months  ADL's / Homemaking Assistance Needed: Independent with ADLs, assist from family with IADLs        Hand Dominance   Dominant Hand: Right    Extremity/Trunk Assessment   Upper Extremity Assessment Upper Extremity Assessment: Overall WFL for tasks assessed    Lower Extremity Assessment Lower Extremity Assessment: Overall WFL for tasks assessed       Communication   Communication: No difficulties  Cognition Arousal/Alertness: Awake/alert Behavior During Therapy: Flat affect Overall Cognitive Status: Within Functional Limits for tasks assessed                                 General Comments: Pt does struggle with some command follow      General Comments      Exercises     Assessment/Plan    PT Assessment Patient needs continued PT services  PT Problem List Decreased strength;Decreased balance;Decreased mobility       PT Treatment Interventions DME instruction;Gait training;Therapeutic activities;Therapeutic  exercise;Balance training;Neuromuscular re-education    PT Goals (Current goals can be found in the Care Plan section)  Acute Rehab PT Goals Patient Stated Goal: Return home PT Goal Formulation: With patient Time For Goal Achievement: 07/09/18 Potential to Achieve Goals: Good    Frequency Min 2X/week   Barriers to discharge        Co-evaluation               AM-PAC PT "6 Clicks" Daily Activity  Outcome Measure Difficulty turning over in bed (including adjusting bedclothes, sheets and blankets)?: None Difficulty moving from lying on back to sitting on the side of the bed? : None Difficulty sitting down on and standing up from a chair with arms (e.g., wheelchair, bedside commode, etc,.)?: None Help needed moving to and from a bed to chair (including a wheelchair)?: None Help needed walking in hospital room?: None Help needed climbing 3-5 steps with a railing? : A Little 6 Click Score: 23    End of Session Equipment Utilized During Treatment: Gait belt Activity Tolerance: Patient tolerated treatment well Patient left: in bed;with bed alarm set;with call bell/phone within reach   PT Visit  Diagnosis: Unsteadiness on feet (R26.81);Muscle weakness (generalized) (M62.81)    Time: 7262-0355 PT Time Calculation (min) (ACUTE ONLY): 26 min   Charges:   PT Evaluation $PT Eval Low Complexity: 1 Low PT Treatments $Gait Training: 8-22 mins        Lyndel Safe Huprich PT, DPT, GCS   Huprich,Jason 06/25/2018, 9:50 AM

## 2018-06-25 NOTE — Progress Notes (Signed)
Discharge instructions given and went over with patient and patients son at bedside. Prescription given and reviewed. All questions answered. Home medications returned from pharmacy. Patient discharged home with family via wheelchair by nursing staff. Madlyn Frankel, RN

## 2018-06-25 NOTE — Care Management Obs Status (Signed)
[facility] NOTIFICATION   Patient Details  Name: Carly Marks MRN: [id number] Date of Birth: 1956-09-26   Medicare Observation Status Notification Given:  Yes    Akiko Schexnider A Estoria Geary, RN 06/25/2018, 3:22 PM

## 2018-06-25 NOTE — Care Management Obs Status (Deleted)
[facility] NOTIFICATION   Patient Details  Name: Carly Marks MRN: [id number] Date of Birth: 1956/09/03   Medicare Observation Status Notification Given:       Latanya Maudlin, RN 06/25/2018, 3:21 PM

## 2018-06-25 NOTE — Progress Notes (Signed)
Occupational Therapy Screen Patient Details Name: SAPHYRA HUTT MRN: [id number] DOB: December 17, 1955 Today's Date: 06/25/2018    History of present illness Makaylia Hewett  is a 62 y.o. female with a known history of coronary artery disease, schizophrenia, major depressive disorder, OSA and other comorbidities. Patient was brought to emergency room for an episode of unresponsiveness noted by the family at home.  Per daughter, patient was sitting in a chair, comfortably, talking, when suddenly she became verbally unresponsive and started smacking her lips. This went on for approximately 3 to 4-minute until the patient regained consciousness.  Patient complains of not feeling well for the past 24 hours, although she cannot describe exactly what is bothering her, except that she feels tired and generally weak.  Per family, patient has been more tired and sedated in the past years since she was started on psych medications.  No reports of fever or chills at home; no chest pain, no nausea, vomiting, bleeding. Blood test done emergency room, including CBC and CMP, are grossly unremarkable. CT scan of the brain shows wedge-shaped area of low density in the left cerebral hemisphere posterior watershed distribution, compatible with an acute or subacute infarct. MRI shows no acute changes. At time of OT evaluation pt is awaiting carotid dopplers and neurology consult.    OT comments  Upon OT screen - pt in bed and sister with pt - she report that she is back to baseline - she had PT eval earlier and she report she do use SPC . She is slow to respond to questions at times per sister and pt but that is because of  Some of her meds causing it - they are adjusting it. Bilateral UE AROM and strength WFL - and coordination and manipulation of objects palm<> fingers WNL  - upon assessment by OT - pt and sister in agreement that pt do not need OT at this time    Follow Up Recommendations       Equipment Recommendations        Recommendations for Other Services      Precautions / Restrictions        Mobility Bed Mobility              Transfers             Balance                           ADL either performed or assessed with clinical judgement   ADL                                               Vision       Perception     Praxis      Cognition Arousal/Alertness: Awake/alert Behavior During Therapy: Flat affect Overall Cognitive Status: Within Functional Limits for tasks assessed                                 General Comments: Pt does struggle with some command follow        Exercises     Shoulder Instructions       General Comments      Pertinent Vitals/ Pain       Pain Assessment: No/denies pain  Home Living  Family/patient expects to be discharged to:: Private residence Living Arrangements: Children Available Help at Discharge: Family Type of Home: House Home Access: Stairs to enter Technical brewer of Steps: 4 Entrance Stairs-Rails: Can reach both [place]: One level     Bathroom Shower/Tub: Teacher, early years/pre: [place]: Environmental consultant - 4 wheels;Cane - quad;Cane - single point;Shower seat          Prior Functioning/Environment Level of Independence: Needs assistance  Gait / Transfers Assistance Needed: Pt ambulates with single point cane and has had one fall in the last 12 months ADL's / Homemaking Assistance Needed: Independent with ADLs, assist from family with IADLs       Frequency           Progress Toward Goals  OT Goals(current goals can now be found in the care plan section)     Acute Rehab OT Goals Patient Stated Goal: Return home with family   Plan      Co-evaluation                 AM-PAC PT "6 Clicks" Daily Activity     Outcome Measure                    End of Session        Activity Tolerance     Patient  Left   with sister in bed    Nurse Communication          Time:  -  11h50    Charges:   OT screen      Rosalyn Gess OTR/L,CLT 06/25/2018, 12:33 PM

## 2018-06-25 NOTE — Discharge Instructions (Signed)
Follow with neurology in 2 weeks.

## 2018-06-25 NOTE — Progress Notes (Signed)
As per neurology , pt can be discharged with changing does of [place]. Follow in neuro clinic.

## 2018-06-26 LAB — HIV ANTIBODY (ROUTINE TESTING W REFLEX): HIV SCREEN 4TH GENERATION: NONREACTIVE

## 2018-06-28 ENCOUNTER — Telehealth: Payer: Self-pay

## 2018-06-28 NOTE — Telephone Encounter (Signed)
EMMI Follow-up: Noted on the report that the patient had other questions/problems.  Talked with [name] and she wanted to know the results of her EKG and Echo.  I explained to her that a MD would be able to provide her with the findings.   She will call and make a follow-up appointment.  I let her know there would be a 2nd automated call with a different series of questions and to let us know if she had any other concerns at that time.

## 2018-07-02 NOTE — Discharge Summary (Signed)
[facility] NAME: Carly Marks    MR#:  [id number]  DATE OF BIRTH:  November 25, 1955  DATE OF ADMISSION:  06/24/2018 ADMITTING PHYSICIAN: Amelia Jo, MD  DATE OF DISCHARGE: 06/25/2018  6:15 PM  PRIMARY CARE PHYSICIAN: Ellamae Sia, MD    ADMISSION DIAGNOSIS:  Cerebrovascular accident (CVA), unspecified mechanism ([place]) [I63.9]  DISCHARGE DIAGNOSIS:  Active Problems:   CVA (cerebral vascular accident) ([place])   Stroke (cerebrum) ([place])   SECONDARY DIAGNOSIS:   Past Medical History:  Diagnosis Date  . Anemia   . Anxiety   . Arthritis   . Asthma   . CAD (coronary artery disease)    per patient  . Depression   . Family history of colonic polyps   . GERD (gastroesophageal reflux disease)   . HA (headache)   . HTN (hypertension)   . Major depressive disorder, recurrent episode, moderate ([place])   . Obstructive sleep apnea     HOSPITAL COURSE:   - Patient reports this has happened in the past. Sounds more like a seizure, she had previous stroke which could be a seizure focus. Recommend changing her Depakote to ER as taking Depakote DR once daily may not be effective if seizures. Or change her Depakote DR to 750mg  BID. Also needs outpatient workup with neurology.  - MRI shows remote infarct No acute event.: . Encephalomalacia with gliosis within the left parieto-occipital. No acute events. Scattered remote right cerebellar infarcts present as well. Few additional tiny remote left cerebellar infarcts. Chronic small vessel disease.  - US Carotids: No hemodynamically significant carotid artery stenosis. - LDL: 62 continue statin - hgba1c 4.9 - Untreated OSA is a stroke risk factor, she is not on cpap - pending 2D echo - goal BP normotensive - Patient was on ASA 81mg  daily prior to event, continue - she does not drive  DISCHARGE CONDITIONS:   Stable.  CONSULTS OBTAINED:    DRUG ALLERGIES:   Allergies   Allergen Reactions  . Codeine   . Dextroamphetamine   . Nsaids   . Morphine And Related Other (See Comments)    palpitations   . Penicillins Other (See Comments)    Palpitations  Has patient had a PCN reaction causing immediate rash, facial/tongue/throat swelling, SOB or lightheadedness with hypotension: Yes Has patient had a PCN reaction causing severe rash involving mucus membranes or skin necrosis: No Has patient had a PCN reaction that required hospitalization: No Has patient had a PCN reaction occurring within the last 10 years: No If all of the above answers are "NO", then may proceed with Cephalosporin use.     DISCHARGE MEDICATIONS:   Allergies as of 06/25/2018      Reactions   Codeine    Dextroamphetamine    Nsaids    Morphine And Related Other (See Comments)   palpitations    Penicillins Other (See Comments)   Palpitations  Has patient had a PCN reaction causing immediate rash, facial/tongue/throat swelling, SOB or lightheadedness with hypotension: Yes Has patient had a PCN reaction causing severe rash involving mucus membranes or skin necrosis: No Has patient had a PCN reaction that required hospitalization: No Has patient had a PCN reaction occurring within the last 10 years: No If all of the above answers are "NO", then may proceed with Cephalosporin use.      Medication List    TAKE these medications   acetaminophen 500 MG tablet Commonly known as:  TYLENOL Take  1,000 mg by mouth every 4 (four) hours as needed.   amLODipine-benazepril 5-10 MG capsule Commonly known as:  LOTREL Take 1 capsule by mouth daily.   aspirin 81 MG tablet Take 81 mg by mouth daily.   atorvastatin 40 MG tablet Commonly known as:  LIPITOR Take 40 mg by mouth daily at 6 PM.   cholecalciferol 400 units Tabs tablet Commonly known as:  VITAMIN D Take 400 Units by mouth daily.   divalproex 250 MG DR tablet Commonly known as:  DEPAKOTE Take 3 tablets (750 mg total) by mouth 2  (two) times daily. What changed:    medication strength  how much to take  when to take this   docusate sodium 100 MG capsule Commonly known as:  COLACE Take 100 mg by mouth 2 (two) times daily.   FLUoxetine 20 MG capsule Commonly known as:  PROZAC Take 3 capsules (60 mg total) by mouth daily.   fluticasone 50 MCG/ACT nasal spray Commonly known as:  FLONASE Place 2 sprays into the nose daily.   LORazepam 1 MG tablet Commonly known as:  ATIVAN Take 1 tablet (1 mg total) by mouth 2 (two) times daily.   Melatonin 3 MG Tabs Take 1 tablet (3 mg total) by mouth at bedtime.   metoprolol succinate 25 MG 24 hr tablet Commonly known as:  TOPROL-XL Take 1 tablet (25 mg total) by mouth at bedtime.   NYAMYC powder Generic drug:  nystatin Apply 1 g topically daily as needed.   OLANZapine 10 MG tablet Commonly known as:  ZYPREXA Take 10 mg by mouth at bedtime.   omeprazole 20 MG capsule Commonly known as:  PRILOSEC Take 20 mg by mouth daily.   oxybutynin 5 MG tablet Commonly known as:  DITROPAN Take 5 mg by mouth daily as needed for bladder spasms.   SYMBICORT 80-4.5 MCG/ACT inhaler Generic drug:  budesonide-formoterol Inhale 2 puffs into the lungs daily as needed.   traZODone 100 MG tablet Commonly known as:  DESYREL Take 1 tablet (100 mg total) by mouth at bedtime as needed for sleep. Take 1 or 1-1/2 tablets at bedtime as needed for insomnia What changed:    how much to take  additional instructions        DISCHARGE INSTRUCTIONS:    Follow with neurologist in 1-2 weeks.  If you experience worsening of your admission symptoms, develop shortness of breath, life threatening emergency, suicidal or homicidal thoughts you must seek medical attention immediately by calling 911 or calling your MD immediately  if symptoms less severe.  You Must read complete instructions/literature along with all the possible adverse reactions/side effects for all the Medicines you  take and that have been prescribed to you. Take any new Medicines after you have completely understood and accept all the possible adverse reactions/side effects.   Please note  You were cared for by a hospitalist during your hospital stay. If you have any questions about your discharge medications or the care you received while you were in the hospital after you are discharged, you can call the unit and asked to speak with the hospitalist on call if the hospitalist that took care of you is not available. Once you are discharged, your primary care physician will handle any further medical issues. Please note that NO REFILLS for any discharge medications will be authorized once you are discharged, as it is imperative that you return to your primary care physician (or establish a relationship with a primary care physician  if you do not have one) for your aftercare needs so that they can reassess your need for medications and monitor your lab values.    Today   CHIEF COMPLAINT:  No chief complaint on file.   HISTORY OF PRESENT ILLNESS:  Carly Marks  is a 62 y.o. female with a known history of coronary artery disease, schizophrenia, major depressive disorder, OSA and other comorbidities. Patient was brought to emergency room for an episode of unresponsiveness noted by the family at home.  Per daughter, patient was sitting in a chair, comfortably, talking, when suddenly she became verbally unresponsive and started smacking her lips.  This went on for approximately 3 to 4-minute until the patient regained consciousness.  Patient complains of not feeling well for the past 24 hours, although she cannot describe exactly what is bothering her, except that she feels tired and generally weak.  Per family, patient has been more tired and sedated in the past years since she was started on psych medications.  No reports of fever or chills at home; no chest pain, no nausea, vomiting, bleeding. Blood test done  emergency room, including CBC and CMP, are grossly unremarkable. CT scan of the brain shows wedge-shaped area of low density in the left cerebral hemisphere posterior watershed distribution, compatible with an acute or subacute infarct.   VITAL SIGNS:  Blood pressure (!) 126/49, pulse 69, temperature 98.6 F (37 C), resp. rate 18, height 5\' 5"  (1.651 m), weight 113.4 kg, SpO2 97 %.  I/O:  No intake or output data in the 24 hours ending 07/02/18 2123  PHYSICAL EXAMINATION:  GENERAL:  62 y.o.-year-old patient lying in the bed with no acute distress.  EYES: Pupils equal, round, reactive to light and accommodation. No scleral icterus. Extraocular muscles intact.  HEENT: Head atraumatic, normocephalic. Oropharynx and nasopharynx clear.  NECK:  Supple, no jugular venous distention. No thyroid enlargement, no tenderness.  LUNGS: Normal breath sounds bilaterally, no wheezing, rales,rhonchi or crepitation. No use of accessory muscles of respiration.  CARDIOVASCULAR: S1, S2 normal. No murmurs, rubs, or gallops.  ABDOMEN: Soft, non-tender, non-distended. Bowel sounds present. No organomegaly or mass.  EXTREMITIES: No pedal edema, cyanosis, or clubbing.  NEUROLOGIC: Cranial nerves II through XII are intact. Muscle strength 5/5 in all extremities. Sensation intact. Gait not checked.  PSYCHIATRIC: The patient is alert and oriented x 3.  SKIN: No obvious rash, lesion, or ulcer.   DATA REVIEW:   CBC No results for input(s): WBC, HGB, HCT, PLT in the last 168 hours.  Chemistries  No results for input(s): NA, K, CL, CO2, GLUCOSE, BUN, CREATININE, CALCIUM, MG, AST, ALT, ALKPHOS, BILITOT in the last 168 hours.  Invalid input(s): GFRCGP  Cardiac Enzymes No results for input(s): TROPONINI in the last 168 hours.  Microbiology Results  No results found for this or any previous visit.  RADIOLOGY:  No results found.  EKG:   Orders placed or performed during the hospital encounter of 06/24/18  . ED  EKG  . ED EKG  . EKG 12-Lead  . EKG 12-Lead      Management plans discussed with the patient, family and they are in agreement.  CODE STATUS:  Code Status History    Date Active Date Inactive Code Status Order ID Comments User Context   06/24/2018 2336 06/25/2018 2120 Full Code [id number]  Amelia Jo, MD Inpatient   10/09/2015 0018 10/10/2015 1558 Full Code [id number]  Lance Coon, MD ED      TOTAL TIME TAKING CARE  OF THIS PATIENT: 35 minutes.    Vaughan Basta M.D on 07/02/2018 at 9:23 PM  Between 7am to 6pm - Pager - [phone]  After 6pm go to [email or web address] - password [email or web address] [facility]  Office  [phone]  CC: Primary care physician; Ellamae Sia, MD   Note: This dictation was prepared with Dragon dictation along with smaller phrase technology. Any transcriptional errors that result from this process are unintentional.

## 2018-07-05 ENCOUNTER — Telehealth: Payer: Self-pay

## 2018-07-05 NOTE — Telephone Encounter (Signed)
EMMI Follow-up: Carly Marks called me as she had received a automated call from my number.  I asked if she was taking her medications as there was only 1 change (for Divalproex) noted during this hospitalization.  Her first response was no but then she said she takes at night for depression and sometimes takes in the morning. I asked if she had made her follow-up appointments and she stated she had.  I let her know there would be a second automated call with a different series of questions and to let us know if she had any concerns at that time.

## 2018-08-12 ENCOUNTER — Other Ambulatory Visit: Payer: Self-pay | Admitting: Internal Medicine

## 2018-08-12 DIAGNOSIS — Z1231 Encounter for screening mammogram for malignant neoplasm of breast: Secondary | ICD-10-CM

## 2018-08-23 DIAGNOSIS — R569 Unspecified convulsions: Secondary | ICD-10-CM | POA: Insufficient documentation

## 2018-08-24 ENCOUNTER — Ambulatory Visit: Payer: Medicare Other

## 2018-10-24 ENCOUNTER — Other Ambulatory Visit: Payer: Self-pay

## 2018-10-24 ENCOUNTER — Ambulatory Visit: Payer: Medicare Other | Attending: Internal Medicine | Admitting: Physical Therapy

## 2018-10-24 ENCOUNTER — Encounter: Payer: Self-pay | Admitting: Physical Therapy

## 2018-10-24 DIAGNOSIS — R262 Difficulty in walking, not elsewhere classified: Secondary | ICD-10-CM | POA: Diagnosis present

## 2018-10-24 DIAGNOSIS — R2689 Other abnormalities of gait and mobility: Secondary | ICD-10-CM

## 2018-10-24 DIAGNOSIS — M6281 Muscle weakness (generalized): Secondary | ICD-10-CM | POA: Diagnosis present

## 2018-10-24 NOTE — Therapy (Addendum)
[place] MAIN [facility] SERVICES [address] [place], [place], [address] Phone: [phone]   Fax:  [phone]  Physical Therapy Evaluation  Patient Details  Name: Carly Marks MRN: [id number] Date of Birth: [birth year] Referring Provider (PT): BURNS, HARRIETT P   Encounter Date: 10/24/2018  PT End of Session - 10/24/18 0829    Number of Visits  17    Date for PT Re-Evaluation  12/19/18    Authorization Type  1/10    PT Start Time  0810    PT Stop Time  0900    PT Time Calculation (min)  50 min    Equipment Utilized During Treatment  Gait belt    Activity Tolerance  Patient tolerated treatment well       Past Medical History:  Diagnosis Date  . Anemia   . Anxiety   . Arthritis   . Asthma   . CAD (coronary artery disease)    per patient  . Depression   . Family history of colonic polyps   . GERD (gastroesophageal reflux disease)   . HA (headache)   . HTN (hypertension)   . Major depressive disorder, recurrent episode, moderate ([place])   . Obstructive sleep apnea     Past Surgical History:  Procedure Laterality Date  . CARDIAC CATHETERIZATION  02/19/15, 06/2009, 08/2011  . COLONOSCOPY N/A 04/22/2015   Procedure: COLONOSCOPY;  Surgeon: Hulen Luster, MD;  Location: [facility] ENDOSCOPY;  Service: Gastroenterology;  Laterality: N/A;  . TUBAL LIGATION      There were no vitals filed for this visit.   Subjective Assessment - 10/24/18 0813    Subjective  Patient has left leg pain in hip. She wants to be more active.     Patient is accompained by:  Family member    Pertinent History  Patient was inhospital Aug 16th for one night and then she went home. She does not get up to walk, can not open bottles.  She was up cleaning the house and would go out and run errands. Now she is not getting out of bed and spends most of the day in bed. This has been going on for 6 months. She had a seizure August 16th.     How long can you stand comfortably?   15 mins    How long can you walk comfortably?  20 mins    Patient Stated Goals  to be more active     Currently in Pain?  Yes    Pain Score  8     Pain Location  Hip    Pain Orientation  Left    Pain Descriptors / Indicators  Aching    Pain Type  Chronic pain    Pain Relieving Factors  pain medicine    Effect of Pain on Daily Activities  no effect on her activity level         [facility] PT Assessment - 10/24/18 0821      Assessment   Medical Diagnosis  seizure    Referring Provider (PT)  BURNS, HARRIETT P    Onset Date/Surgical Date  06/24/18    Hand Dominance  Right    Prior Therapy  no   in patient hospital august 16     Precautions   Precautions  Fall      Restrictions   Weight Bearing Restrictions  No      Balance Screen   Has the patient fallen in the past 6 months  Yes    How many times?  1    Has the patient had a decrease in activity level because of a fear of falling?   Yes    Is the patient reluctant to leave their home because of a fear of falling?   No      Home Social worker  Private residence    Living Arrangements  Children    Available Help at Discharge  Family    Type of [place] to enter    Entrance Stairs-Number of Steps  5    Entrance Stairs-Rails  Right;Left;Can reach both    [place]  One level    [place] - 4 wheels;Cane - single point;Shower seat      Prior Function   Level of Independence  Independent with basic ADLs;Independent with household mobility with device    Vocation  On disability      Cognition   Overall Cognitive Status  Impaired/Different from baseline    Area of Impairment  Memory;Following commands;Awareness          POSTURE: trunk flex   PROM/AROM: WFL BUE, BLE  STRENGTH:  Graded on a 0-5 scale Muscle Group Left Right  Grip strength 6 KG 12 KG                      Hip Flex 3/5 -3/5  Hip Abd 2/5 2/5  Hip Add 1/5 1/5  Hip Ext NT NT      Knee  Flex 4/5 4/5  Knee Ext 4/5 4/5  Ankle DF 3/5 3/5  Ankle PF 3/5 3/5   SENSATION: WNL BUE, BLE  FUNCTIONAL MOBILITY: Difficulty with sit to stand and needs BUE, difficulty with rolling supine to sidelying    BALANCE: Standing Dynamic Balance  Normal Stand independently unsupported, able to weight shift and cross midline maximally   Good Stand independently unsupported, able to weight shift and cross midline moderately   Good-/Fair+ Stand independently unsupported, able to weight shift across midline minimally   Fair Stand independently unsupported, weight shift, and reach ipsilaterally, loss of balance when crossing midline   Poor+ Able to stand with Min A and reach ipsilaterally, unable to weight shift   Poor Able to stand with Mod A and minimally reach ipsilaterally, unable to cross midline. x   Static Standing Balance  Normal Able to maintain standing balance against maximal resistance   Good Able to maintain standing balance against moderate resistance   Good-/Fair+ Able to maintain standing balance against minimal resistance   Fair Able to stand unsupported without UE support and without LOB for 1-2 min   Fair- Requires Min A and UE support to maintain standing without loss of balance x  Poor+ Requires mod A and UE support to maintain standing without loss of balance   Poor Requires max A and UE support to maintain standing balance without loss       GAIT: Patient ambulates with decreased gait speed and SPC, household distances , wide base of support and path deviation with head turns  OUTCOME MEASURES: TEST Outcome Interpretation  5 times sit<>stand 28.56sec >60 yo, >15 sec indicates increased risk for falls  10 meter walk test    .46             m/s <1.0 m/s indicates increased risk for falls; limited community ambulator  Timed up and Go  34.46  sec <14 sec indicates increased risk for falls                Treatment: Standing hip abd x 10 BLE Standing  hip ext x 10 BLE Standing marching x 10 BLE Supine bridging x 10  Hooklying marching x 10 BLE         Objective measurements completed on examination: See above findings.              PT Education - 10/24/18 0828    Education Details  plan of care    Person(s) Educated  Patient    Methods  Explanation    Comprehension  Verbalized understanding       PT Short Term Goals - 10/24/18 1013      PT SHORT TERM GOAL #1   Title  Patient will be independent in home exercise program to improve strength/mobility for better functional independence with ADLs.    Time  4    Period  Weeks    Status  New    Target Date  11/21/18        PT Long Term Goals - 10/24/18 1013      PT LONG TERM GOAL #1   Title  Patient (> 79 years old) will complete five times sit to stand test in < 15 seconds indicating an increased LE strength and improved balance    Time  8    Period  Weeks    Status  New    Target Date  12/19/18      PT LONG TERM GOAL #2   Title  Patient will increase 10 meter walk test to >1.23m/s as to improve gait speed for better community ambulation and to reduce fall risk.    Time  8    Period  Weeks    Status  New    Target Date  12/19/18      PT LONG TERM GOAL #3   Title  Patient will reduce timed up and go to <11 seconds to reduce fall risk and demonstrate improved transfer/gait ability.    Time  8    Period  Weeks    Status  New    Target Date  12/19/18             Plan - 10/24/18 1005    Clinical Impression Statement  Patietn is 19 yr. old female with dx of seizures. She has had a decrease in her mobility over the past several months and a recent fall. She has decreased strength BLE and BUE, decreased static and dynamic standing balance, decreased gait with spc and decreased mobility with safety for transfers. Patient has a falls risk and will benefit from skilled PT to improve quality of life and decrease her falls risk and improve her mobility.      Clinical Decision Making  Low    Rehab Potential  Fair    PT Frequency  2x / week    PT Duration  8 weeks    PT Treatment/Interventions  Manual techniques;Neuromuscular re-education;Patient/family education;Balance training;Therapeutic activities;Therapeutic exercise;Gait training    PT Home Exercise Plan  BRIDGES, Hooklying marching, standing hip abd, hip ext    Consulted and Agree with Plan of Care  Patient;Family member/caregiver       Patient will benefit from skilled therapeutic intervention in order to improve the following deficits and impairments:  Abnormal gait, Decreased balance, Decreased endurance, Difficulty walking, Decreased mobility, Obesity, Pain, Decreased strength, Decreased safety awareness, Decreased activity  tolerance  Visit Diagnosis: Difficulty in walking, not elsewhere classified - Plan: PT plan of care cert/re-cert  Muscle weakness (generalized) - Plan: PT plan of care cert/re-cert  Other abnormalities of gait and mobility - Plan: PT plan of care cert/re-cert     Problem List Patient Active Problem List   Diagnosis Date Noted  . 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 CVA (cerebral vascular accident) ([place]) 06/24/2018  . Anxiety 10/08/2015  . Symptomatic bradycardia 10/08/2015  . CAD (coronary artery disease) 04/16/2015  . Chronic constipation 04/16/2015  . HTN (hypertension) 04/16/2015  . Arthritis, degenerative 04/16/2015  . Allergic rhinitis, seasonal 04/16/2015  . Avitaminosis D 04/16/2015  . Depression, major, recurrent, moderate ([place]) 04/15/2015  . Atypical chest pain 06/26/2013  . Brash 06/26/2013  . Arthritis 06/26/2013  . Asthma, intermittent 01/17/2013  . Airway hyperreactivity 07/20/2011  . GERD (gastroesophageal reflux disease) 07/20/2011  . HCD (hypertensive cardiovascular disease) 07/20/2011    Alanson Puls, PT DPT 10/24/2018, 10:52 AM  [place] MAIN [facility] SERVICES [place], [place], [address] Phone: [phone]   Fax:  [phone]  Name: Carly Marks MRN: [id number] Date of Birth: [birth year]

## 2018-10-24 NOTE — Patient Instructions (Addendum)
Bridge    Lying on back, legs bent 90, feet flat on floor. Press up hips and torso, reaching hands to feet. Hold for 3 seconds   Hip Flexion (Standing)    Stand with support. Squeeze pelvic floor and hold. Lift right knee upward. Hold for _3__ seconds.  Repeat 10___ times. Do 2___ times a day. Repeat with other leg.     Copyright  VHI. All rights reserved.  Hip Extension (Standing)    Stand with support. Squeeze pelvic floor and hold. Move right leg backward with straight knee.  Repeat __10_ times. Do 2___ times a day. Repeat with other leg.  Copyright  VHI. All rights reserved.  HIP: Abduction - Standing (Band)    . Raise leg out and slightly back.  10___ sets per day, 2___ days per week Hold onto a support.  Copyright  VHI. All rights reserved.  Hip Flexion - Supine    Lying on back, knees bent, feet on floor, bend hips, bringing knees toward trunk. Repeat _10__ times. Do __2_ times per day.  Copyright  VHI. All rights reserved.  _  Copyright  VHI. All rights reserved.

## 2018-10-26 ENCOUNTER — Ambulatory Visit: Payer: Medicare Other | Admitting: Physical Therapy

## 2018-10-26 ENCOUNTER — Encounter: Payer: Self-pay | Admitting: Physical Therapy

## 2018-10-26 DIAGNOSIS — R2689 Other abnormalities of gait and mobility: Secondary | ICD-10-CM

## 2018-10-26 DIAGNOSIS — R262 Difficulty in walking, not elsewhere classified: Secondary | ICD-10-CM | POA: Diagnosis not present

## 2018-10-26 DIAGNOSIS — M6281 Muscle weakness (generalized): Secondary | ICD-10-CM

## 2018-10-26 NOTE — Therapy (Signed)
[place] MAIN [facility] SERVICES [address] [place], [place], [address] Phone: [phone]   Fax:  [phone]  Physical Therapy Treatment  Patient Details  Name: Carly Marks MRN: [id number] Date of Birth: December 22, 1955 Referring Provider (PT): BURNS, HARRIETT P   Encounter Date: 10/26/2018  PT End of Session - 10/26/18 0953    Visit Number  2    Number of Visits  17    Date for PT Re-Evaluation  12/19/18    Authorization Type  2/10    PT Start Time  0940    PT Stop Time  1010    PT Time Calculation (min)  30 min    Equipment Utilized During Treatment  Gait belt    Activity Tolerance  Patient tolerated treatment well;Patient limited by fatigue       Past Medical History:  Diagnosis Date  . Anemia   . Anxiety   . Arthritis   . Asthma   . CAD (coronary artery disease)    per patient  . Depression   . Family history of colonic polyps   . GERD (gastroesophageal reflux disease)   . HA (headache)   . HTN (hypertension)   . Major depressive disorder, recurrent episode, moderate ([place])   . Obstructive sleep apnea     Past Surgical History:  Procedure Laterality Date  . CARDIAC CATHETERIZATION  02/19/15, 06/2009, 08/2011  . COLONOSCOPY N/A 04/22/2015   Procedure: COLONOSCOPY;  Surgeon: Hulen Luster, MD;  Location: [facility] ENDOSCOPY;  Service: Gastroenterology;  Laterality: N/A;  . TUBAL LIGATION      There were no vitals filed for this visit.  Subjective Assessment - 10/26/18 0947    Subjective  Patient is not hurting today.  She wants to be more active.     Patient is accompained by:  Family member    Pertinent History  Patient was inhospital Aug 16th for one night and then she went home. She does not get up to walk, can not open bottles.  She was up cleaning the house and would go out and run errands. Now she is not getting out of bed and spends most of the day in bed. This has been going on for 6 months. She had a seizure August 16th.      How long can you stand comfortably?  15 mins    How long can you walk comfortably?  20 mins    Patient Stated Goals  to be more active     Currently in Pain?  No/denies    Pain Score  0-No pain       Treatment: SAQ over bolster with 10 sec hold x 10 hooklying marching x 15  Hooklying bridging with 5 sec hold x 10 SLR bilateral x 10 ; patient is not able to keep knee straight  LAQ with 5 seconds x 10 reps  Sit to stand from mat table x 5  Standing hip ext x 10 with cues to keep leg in extension and not able to do it this way Marching in standing x 10 , cues to raise up legs high Standing hiip abd with cues to keep her foot straight and not have her toe "out" in ER, but patient is not able to perform exercise in the correct position  Patient needs max cues to perform exercises due to delay in processing commands  PT Education - 10/26/18 0953    Education Details  HEP    Person(s) Educated  Patient    Methods  Explanation    Comprehension  Verbalized understanding       PT Short Term Goals - 10/24/18 1013      PT SHORT TERM GOAL #1   Title  Patient will be independent in home exercise program to improve strength/mobility for better functional independence with ADLs.    Time  4    Period  Weeks    Status  New    Target Date  11/21/18        PT Long Term Goals - 10/24/18 1013      PT LONG TERM GOAL #1   Title  Patient (> 62 years old) will complete five times sit to stand test in < 15 seconds indicating an increased LE strength and improved balance    Time  8    Period  Weeks    Status  New    Target Date  12/19/18      PT LONG TERM GOAL #2   Title  Patient will increase 10 meter walk test to >1.58m/s as to improve gait speed for better community ambulation and to reduce fall risk.    Time  8    Period  Weeks    Status  New    Target Date  12/19/18      PT LONG TERM GOAL #3   Title  Patient will reduce timed up and go to  <11 seconds to reduce fall risk and demonstrate improved transfer/gait ability.    Time  8    Period  Weeks    Status  New    Target Date  12/19/18            Plan - 10/26/18 0954    Clinical Impression Statement  Pt requires constant direction and verbal cues and hand over hand cues for correct performance of gait and strengthening exercises. Patient has fatigue with endurance and difficulty with transfers and standing tasks.  Patient struggles with posture and ability to perform sit to stand from unstable surfaces due to weakness, as well as balance with mobility.  Pt encouraged continuing HEP   Patient will benefit from continued skilled PT to improve mobility and safety.    Rehab Potential  Fair    PT Frequency  2x / week    PT Duration  8 weeks    PT Treatment/Interventions  Manual techniques;Neuromuscular re-education;Patient/family education;Balance training;Therapeutic activities;Therapeutic exercise;Gait training    PT Home Exercise Plan  BRIDGES, Hooklying marching, standing hip abd, hip ext    Consulted and Agree with Plan of Care  Patient;Family member/caregiver       Patient will benefit from skilled therapeutic intervention in order to improve the following deficits and impairments:  Abnormal gait, Decreased balance, Decreased endurance, Difficulty walking, Decreased mobility, Obesity, Pain, Decreased strength, Decreased safety awareness, Decreased activity tolerance  Visit Diagnosis: Difficulty in walking, not elsewhere classified  Muscle weakness (generalized)  Other abnormalities of gait and mobility     Problem List Patient Active Problem List   Diagnosis Date Noted  . 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 CVA (cerebral vascular accident) ([place]) 06/24/2018  . Anxiety 10/08/2015  . Symptomatic bradycardia 10/08/2015  . CAD (coronary artery disease) 04/16/2015  . Chronic constipation 04/16/2015  . HTN (hypertension) 04/16/2015  . Arthritis, degenerative  04/16/2015  . Allergic rhinitis, seasonal 04/16/2015  . Avitaminosis D 04/16/2015  .  Depression, major, recurrent, moderate ([place]) 04/15/2015  . Atypical chest pain 06/26/2013  . Brash 06/26/2013  . Arthritis 06/26/2013  . Asthma, intermittent 01/17/2013  . Airway hyperreactivity 07/20/2011  . GERD (gastroesophageal reflux disease) 07/20/2011  . HCD (hypertensive cardiovascular disease) 07/20/2011    Alanson Puls, PT DPT 10/26/2018, 10:17 AM  [place] MAIN [facility] SERVICES [address] [place], [place], [address] Phone: [phone]   Fax:  [phone]  Name: Carly Marks MRN: [id number] Date of Birth: 1956-02-02

## 2018-10-31 ENCOUNTER — Ambulatory Visit: Payer: Medicare Other | Admitting: Physical Therapy

## 2018-11-03 ENCOUNTER — Ambulatory Visit: Payer: Medicare Other | Admitting: Physical Therapy

## 2018-11-07 ENCOUNTER — Ambulatory Visit: Payer: Medicare Other | Admitting: Physical Therapy

## 2018-11-08 ENCOUNTER — Encounter: Payer: Self-pay | Admitting: Physical Therapy

## 2018-11-08 ENCOUNTER — Ambulatory Visit: Payer: Medicare Other | Admitting: Physical Therapy

## 2018-11-08 DIAGNOSIS — R262 Difficulty in walking, not elsewhere classified: Secondary | ICD-10-CM

## 2018-11-08 DIAGNOSIS — M6281 Muscle weakness (generalized): Secondary | ICD-10-CM

## 2018-11-08 DIAGNOSIS — R2689 Other abnormalities of gait and mobility: Secondary | ICD-10-CM

## 2018-11-08 NOTE — Therapy (Signed)
[place] MAIN [facility] SERVICES [address] [place], [place], [address] Phone: [phone]   Fax:  [phone]  Physical Therapy Treatment  Patient Details  Name: Carly Marks MRN: [id number] Date of Birth: 12-25-1955 Referring Provider (PT): BURNS, HARRIETT P   Encounter Date: 11/08/2018  PT End of Session - 11/08/18 0951    Visit Number  3    Number of Visits  17    Date for PT Re-Evaluation  12/19/18    Authorization Type  3/10    PT Start Time  0945    PT Stop Time  1015    PT Time Calculation (min)  30 min    Equipment Utilized During Treatment  Gait belt    Activity Tolerance  Patient tolerated treatment well;Patient limited by fatigue    Behavior During Therapy  [facility] for tasks assessed/performed;Flat affect       Past Medical History:  Diagnosis Date  . Anemia   . Anxiety   . Arthritis   . Asthma   . CAD (coronary artery disease)    per patient  . Depression   . Family history of colonic polyps   . GERD (gastroesophageal reflux disease)   . HA (headache)   . HTN (hypertension)   . Major depressive disorder, recurrent episode, moderate ([place])   . Obstructive sleep apnea     Past Surgical History:  Procedure Laterality Date  . CARDIAC CATHETERIZATION  02/19/15, 06/2009, 08/2011  . COLONOSCOPY N/A 04/22/2015   Procedure: COLONOSCOPY;  Surgeon: Hulen Luster, MD;  Location: [facility] ENDOSCOPY;  Service: Gastroenterology;  Laterality: N/A;  . TUBAL LIGATION      There were no vitals filed for this visit.  Subjective Assessment - 11/08/18 0948    Subjective  Patient is hurting today in both her calfs.  She wants to be more active.     Pertinent History  Patient was inhospital Aug 16th for one night and then she went home. She does not get up to walk, can not open bottles.  She was up cleaning the house and would go out and run errands. Now she is not getting out of bed and spends most of the day in bed. This has been going on for  6 months. She had a seizure August 16th.     How long can you stand comfortably?  15 mins    How long can you walk comfortably?  20 mins    Patient Stated Goals  to be more active     Currently in Pain?  Yes    Pain Score  7     Pain Location  Calf    Pain Orientation  Right;Left    Pain Descriptors / Indicators  Aching    Pain Type  Acute pain    Pain Onset  Today    Pain Frequency  Rarely    Aggravating Factors   unknown    Pain Relieving Factors  unknown    Effect of Pain on Daily Activities  unknown    Multiple Pain Sites  No       Treatment: Therapeutic exercise: Supine hip abd x 10 x 2 BLE, max VC and tactile cues to perform exercise and to stay on task Supine heel slide x 10 x 2 BLE , max VC and TC to perform exercise correctly and to stay on taks [place] x 10 , cues for higher height hooklying abd/ER x 10  Hooklying marching x 10  x 2 , cues for higher height   Therapeutic activities: Gait training with spc 30 feet with CGA, flexed posture and shuffling steps, decreased step height  Transfer training from various surface heights including nu-step, mat 21 inches, with CGA Bed mobility training for supine <> sidelying with VC and SBA,  supine <> sit with CGA and VC   Patient has slow motor responses to cues for performing exercises                      PT Education - 11/08/18 0949    Education Details  HEP    Person(s) Educated  Patient    Methods  Explanation    Comprehension  Returned demonstration;Need further instruction       PT Short Term Goals - 10/24/18 1013      PT SHORT TERM GOAL #1   Title  Patient will be independent in home exercise program to improve strength/mobility for better functional independence with ADLs.    Time  4    Period  Weeks    Status  New    Target Date  11/21/18        PT Long Term Goals - 10/24/18 1013      PT LONG TERM GOAL #1   Title  Patient (> 62 years old) will complete five times sit to stand test  in < 15 seconds indicating an increased LE strength and improved balance    Time  8    Period  Weeks    Status  New    Target Date  12/19/18      PT LONG TERM GOAL #2   Title  Patient will increase 10 meter walk test to >1.8m/s as to improve gait speed for better community ambulation and to reduce fall risk.    Time  8    Period  Weeks    Status  New    Target Date  12/19/18      PT LONG TERM GOAL #3   Title  Patient will reduce timed up and go to <11 seconds to reduce fall risk and demonstrate improved transfer/gait ability.    Time  8    Period  Weeks    Status  New    Target Date  12/19/18            Plan - 11/08/18 0951    Clinical Impression Statement  Patient arrives 15 mins late to appointment. Patient has flat affect during therapy and is slow to perform required tasks. She transfers slowly needing multiple verbal cues for correct foot and hand placements. She performs therapeutic activities including transfer training and mobility trainng from supien to rolling left and right and supine <> sit with min assist and max VC. She will conitinue to benefit from skilled PT to improve mobility and strength.     Rehab Potential  Fair    PT Frequency  2x / week    PT Duration  8 weeks    PT Treatment/Interventions  Manual techniques;Neuromuscular re-education;Patient/family education;Balance training;Therapeutic activities;Therapeutic exercise;Gait training    PT Home Exercise Plan  BRIDGES, Hooklying marching, standing hip abd, hip ext    Consulted and Agree with Plan of Care  Patient;Family member/caregiver       Patient will benefit from skilled therapeutic intervention in order to improve the following deficits and impairments:  Abnormal gait, Decreased balance, Decreased endurance, Difficulty walking, Decreased mobility, Obesity, Pain, Decreased strength, Decreased safety awareness, Decreased activity  tolerance  Visit Diagnosis: Difficulty in walking, not elsewhere  classified  Muscle weakness (generalized)  Other abnormalities of gait and mobility     Problem List Patient Active Problem List   Diagnosis Date Noted  . 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 CVA (cerebral vascular accident) ([place]) 06/24/2018  . Anxiety 10/08/2015  . Symptomatic bradycardia 10/08/2015  . CAD (coronary artery disease) 04/16/2015  . Chronic constipation 04/16/2015  . HTN (hypertension) 04/16/2015  . Arthritis, degenerative 04/16/2015  . Allergic rhinitis, seasonal 04/16/2015  . Avitaminosis D 04/16/2015  . Depression, major, recurrent, moderate ([place]) 04/15/2015  . Atypical chest pain 06/26/2013  . Brash 06/26/2013  . Arthritis 06/26/2013  . Asthma, intermittent 01/17/2013  . Airway hyperreactivity 07/20/2011  . GERD (gastroesophageal reflux disease) 07/20/2011  . HCD (hypertensive cardiovascular disease) 07/20/2011    Alanson Puls, PT DPT 11/08/2018, 10:08 AM  [place] MAIN [facility] SERVICES [address] [place], [place], [address] Phone: [phone]   Fax:  [phone]  Name: Carly Marks MRN: [id number] Date of Birth: 05/05/1956

## 2018-11-14 ENCOUNTER — Ambulatory Visit: Payer: Medicare Other | Attending: Internal Medicine | Admitting: Physical Therapy

## 2018-11-14 ENCOUNTER — Ambulatory Visit: Payer: Medicare Other | Admitting: Physical Therapy

## 2018-11-14 ENCOUNTER — Encounter: Payer: Self-pay | Admitting: Physical Therapy

## 2018-11-14 DIAGNOSIS — M6281 Muscle weakness (generalized): Secondary | ICD-10-CM | POA: Diagnosis present

## 2018-11-14 DIAGNOSIS — R262 Difficulty in walking, not elsewhere classified: Secondary | ICD-10-CM

## 2018-11-14 DIAGNOSIS — R2689 Other abnormalities of gait and mobility: Secondary | ICD-10-CM | POA: Diagnosis present

## 2018-11-14 DIAGNOSIS — R278 Other lack of coordination: Secondary | ICD-10-CM | POA: Diagnosis present

## 2018-11-14 NOTE — Therapy (Signed)
[place] MAIN [facility] SERVICES [address] [place], [place], [address] Phone: [phone]   Fax:  [phone]  Physical Therapy Treatment  Patient Details  Name: Carly Marks MRN: [id number] Date of Birth: May 17, 1956 Referring Provider (PT): BURNS, HARRIETT P   Encounter Date: 11/14/2018  PT End of Session - 11/14/18 0917    Visit Number  4    Number of Visits  17    Date for PT Re-Evaluation  12/19/18    Authorization Type  4/10  ( eval date 10/24/18)    PT Start Time  0915    PT Stop Time  1000    PT Time Calculation (min)  45 min    Equipment Utilized During Treatment  Gait belt    Activity Tolerance  Patient tolerated treatment well;Patient limited by fatigue    Behavior During Therapy  [facility] for tasks assessed/performed;Flat affect       Past Medical History:  Diagnosis Date  . Anemia   . Anxiety   . Arthritis   . Asthma   . CAD (coronary artery disease)    per patient  . Depression   . Family history of colonic polyps   . GERD (gastroesophageal reflux disease)   . HA (headache)   . HTN (hypertension)   . Major depressive disorder, recurrent episode, moderate ([place])   . Obstructive sleep apnea     Past Surgical History:  Procedure Laterality Date  . CARDIAC CATHETERIZATION  02/19/15, 06/2009, 08/2011  . COLONOSCOPY N/A 04/22/2015   Procedure: COLONOSCOPY;  Surgeon: Hulen Luster, MD;  Location: [facility] ENDOSCOPY;  Service: Gastroenterology;  Laterality: N/A;  . TUBAL LIGATION      There were no vitals filed for this visit.  Subjective Assessment - 11/14/18 0917    Subjective  Patient is not  hurting today.She wants to be more active.     Patient is accompained by:  Family member    Pertinent History  Patient was inhospital Aug 16th for one night and then she went home. She does not get up to walk, can not open bottles.  She was up cleaning the house and would go out and run errands. Now she is not getting out of bed and  spends most of the day in bed. This has been going on for 6 months. She had a seizure August 16th.     How long can you stand comfortably?  15 mins    How long can you walk comfortably?  20 mins    Patient Stated Goals  to be more active     Currently in Pain?  No/denies    Pain Score  0-No pain    Pain Onset  Today         Ther-ex  Standing hip abd  x 15 BLE and  BUE support  Standing hip extension  x 10 BLE with BUE support , cues to keep knee straight  Standing hip flexion marches  x 10 BLE with BUE support  Standing HS curls  x 10 BLE with BUE support  Standing heel raises x 15   LAQ  with 3 second holds x10 each LE  Marching in sitting x 20 BLE STS from chair without UE support 2x10  Mini squats x10 with no UE support  Toe taps to 6" step without UE support alternating LE x15 each  Forward and backward stepping over 1/2 foam x15 each direction  SIde stepping over 1/2 foam 15x  each direction    Verbal cues for correct technique , and posture  correction                     PT Education - 11/14/18 0917    Education Details  HEP    Person(s) Educated  Patient    Methods  Explanation    Comprehension  Verbalized understanding;Returned demonstration;Need further instruction       PT Short Term Goals - 10/24/18 1013      PT SHORT TERM GOAL #1   Title  Patient will be independent in home exercise program to improve strength/mobility for better functional independence with ADLs.    Time  4    Period  Weeks    Status  New    Target Date  11/21/18        PT Long Term Goals - 10/24/18 1013      PT LONG TERM GOAL #1   Title  Patient (> 25 years old) will complete five times sit to stand test in < 15 seconds indicating an increased LE strength and improved balance    Time  8    Period  Weeks    Status  New    Target Date  12/19/18      PT LONG TERM GOAL #2   Title  Patient will increase 10 meter walk test to >1.57m/s as to improve gait speed for  better community ambulation and to reduce fall risk.    Time  8    Period  Weeks    Status  New    Target Date  12/19/18      PT LONG TERM GOAL #3   Title  Patient will reduce timed up and go to <11 seconds to reduce fall risk and demonstrate improved transfer/gait ability.    Time  8    Period  Weeks    Status  New    Target Date  12/19/18            Plan - 11/14/18 3267    Clinical Impression Statement  Pt presents with decreased gait speed with spc and needs rest periods during standing exercises and decreased standing tolerance and LE weakness and requires frequent verbal cues for upright posture and education provided on relationship between posture and gait. Patient demonstrates instability with therapeutic exercises and balance exercises but tolerates all interventions well. Patient will benefit from continued skilled PT interventions for improved balance, posture, strength, and QOL.    Rehab Potential  Fair    PT Frequency  2x / week    PT Duration  8 weeks    PT Treatment/Interventions  Manual techniques;Neuromuscular re-education;Patient/family education;Balance training;Therapeutic activities;Therapeutic exercise;Gait training    PT Home Exercise Plan  BRIDGES, Hooklying marching, standing hip abd, hip ext    Consulted and Agree with Plan of Care  Patient;Family member/caregiver       Patient will benefit from skilled therapeutic intervention in order to improve the following deficits and impairments:  Abnormal gait, Decreased balance, Decreased endurance, Difficulty walking, Decreased mobility, Obesity, Pain, Decreased strength, Decreased safety awareness, Decreased activity tolerance  Visit Diagnosis: Difficulty in walking, not elsewhere classified  Muscle weakness (generalized)  Other abnormalities of gait and mobility     Problem List Patient Active Problem List   Diagnosis Date Noted  . 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 CVA (cerebral vascular  accident) ([place]) 06/24/2018  . Anxiety 10/08/2015  . Symptomatic bradycardia 10/08/2015  . CAD (  coronary artery disease) 04/16/2015  . Chronic constipation 04/16/2015  . HTN (hypertension) 04/16/2015  . Arthritis, degenerative 04/16/2015  . Allergic rhinitis, seasonal 04/16/2015  . Avitaminosis D 04/16/2015  . Depression, major, recurrent, moderate ([place]) 04/15/2015  . Atypical chest pain 06/26/2013  . Brash 06/26/2013  . Arthritis 06/26/2013  . Asthma, intermittent 01/17/2013  . Airway hyperreactivity 07/20/2011  . GERD (gastroesophageal reflux disease) 07/20/2011  . HCD (hypertensive cardiovascular disease) 07/20/2011    Alanson Puls, PT DPT 11/14/2018, 9:30 AM  [place] MAIN [facility] SERVICES [address] [place], [place], [address] Phone: [phone]   Fax:  [phone]  Name: Carly Marks MRN: [id number] Date of Birth: [birth year]

## 2018-11-16 ENCOUNTER — Ambulatory Visit: Payer: Medicare Other | Admitting: Physical Therapy

## 2018-11-21 ENCOUNTER — Ambulatory Visit: Payer: Medicare Other | Admitting: Physical Therapy

## 2018-11-22 ENCOUNTER — Ambulatory Visit: Payer: Medicare Other | Admitting: Occupational Therapy

## 2018-11-22 ENCOUNTER — Encounter: Payer: Self-pay | Admitting: Occupational Therapy

## 2018-11-22 DIAGNOSIS — M6281 Muscle weakness (generalized): Secondary | ICD-10-CM

## 2018-11-22 DIAGNOSIS — R262 Difficulty in walking, not elsewhere classified: Secondary | ICD-10-CM | POA: Diagnosis not present

## 2018-11-22 DIAGNOSIS — R278 Other lack of coordination: Secondary | ICD-10-CM

## 2018-11-22 NOTE — Therapy (Signed)
[place] MAIN [facility] SERVICES [address] [place], [place], [address] Phone: [phone]   Fax:  [phone]  Occupational Therapy Evaluation  Patient Details  Name: Carly Marks MRN: [id number] Date of Birth: 04-02-1956 Referring Provider (OT): Quay Burow, [place]   Encounter Date: 11/22/2018  OT End of Session - 11/22/18 1720    Visit Number  1    Number of Visits  24    Date for OT Re-Evaluation  02/14/19    Authorization Type  Progress reports period beginning 11/22/2018.    OT Start Time  1100    OT Stop Time  1200    OT Time Calculation (min)  60 min    Activity Tolerance  Patient tolerated treatment well;Patient limited by fatigue    Behavior During Therapy  [facility] for tasks assessed/performed;Flat affect       Past Medical History:  Diagnosis Date  . Anemia   . Anxiety   . Arthritis   . Asthma   . CAD (coronary artery disease)    per patient  . Depression   . Family history of colonic polyps   . GERD (gastroesophageal reflux disease)   . HA (headache)   . HTN (hypertension)   . Major depressive disorder, recurrent episode, moderate ([place])   . Obstructive sleep apnea     Past Surgical History:  Procedure Laterality Date  . CARDIAC CATHETERIZATION  02/19/15, 06/2009, 08/2011  . COLONOSCOPY N/A 04/22/2015   Procedure: COLONOSCOPY;  Surgeon: Hulen Luster, MD;  Location: [facility] ENDOSCOPY;  Service: Gastroenterology;  Laterality: N/A;  . TUBAL LIGATION      There were no vitals filed for this visit.  Subjective Assessment - 11/22/18 1653    Subjective   Pt. is present with her daughter today.    Patient is accompained by:  Family member    Pertinent History  Pt. is a 63 y.o. female who was diagnosed with a CVA on 06/24/2018. Pt. has been receiving outpatient PT services, and is now appropriate for outpatient OT services.  Pt. has a history of Seizures. Pt.'s daughter reports that she has been diagnosed with early dementia recently,  and is scheduled to be evaluated by specialists in [place] in March of 2020. Pt. has supportive children, and resides with her daughter who assists the patient with ADLs, and IADLs.  Pt. is waiting for home care aides through the [facility] program.    Currently in Pain?  No/denies        [facility] OT Assessment - 11/22/18 1113      Assessment   Medical Diagnosis  Seizure, CVA    Referring Provider (OT)  Burns, Harriett    Onset Date/Surgical Date  06/24/18    Hand Dominance  Right    Next MD Visit  --   To be evaluated in [facility] secondary to early Dementia,     Precautions   Precautions  Fall      Balance Screen   Has the patient fallen in the past 6 months  No      Home  Environment   Family/patient expects to be discharged to:  Private residence    Available Help at Discharge  Family    Type of Ong    Entrance Stairs-Rails  Right;Left   Far apart   [place]  One level    Alternate Level Stairs - Number of Steps  one storey  Bathroom Shower/Tub  Investment banker, operational - 4 wheels;Cane - single point;Shower seat    Lives With  Daughter      Prior Function   Level of Independence  Independent;Independent with basic ADLs    Vocation  On disability      ADL   Eating/Feeding  Minimal assistance   shaking, food falls off the utensil. Assist cutitng food.   Grooming  Independent   Has dentures, has to have them refitted.   Upper Body Bathing  Minimal assistance    Lower Body Bathing  Minimal assistance    Upper Body Dressing  Minimal assistance    Lower Body Dressing  Minimal assistance    Toilet Transfer  Moderate assistance    Toileting -  Hygiene  Moderate assistance    Tub/Shower Transfer  Moderate assistance    ADL comments  Pt. is waiting to receive caregiver services through [facility].      IADL    Shopping  Completely unable to shop    Meal Prep  Needs to have meals prepared and served    [employer] on family or friends for transportation    Medication Management  Is not capable of dispensing or managing own medication    Financial Management  Dependent      Written Expression   Dominant Hand  Right    Handwriting  50% legible   Name only     Vision - History   Baseline Vision  Wears glasses all the time   Reports sometimes she forgets to put her glasses on.     Activity Tolerance   Activity Tolerance  Tolerates 10-20 min activity with multiple rests      Cognition   Overall Cognitive Status  History of cognitive impairments - at baseline   Per daughter, pt. has early Dementia.   Area of Impairment  --   Memory, tasks initiation, following directions     Sensation   Light Touch  --   Difficult to assess.     Coordination   Gross Motor Movements are Fluid and Coordinated  No    Fine Motor Movements are Fluid and Coordinated  No    Right 9 Hole Peg Test  41 sec.    Left 9 Hole Peg Test  34min. & 3 sec.    Uses right hand to assist with turning pegs     Strength   Overall Strength Comments  LUE shoulder, elbow flexion 3/5, abduction 3-/5, elbow extension, wrist extension 3+/5. 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   Left Hand Lateral Pinch  7 lbs                        OT Short Term Goals - 11/22/18 1741      OT SHORT TERM GOAL #1   Title  --    Baseline  --        OT Long Term Goals - 11/22/18 1742      OT LONG TERM GOAL #1  Title  Pt. will increase UE strength by 2 mm grades to assist with ADLs, and IADLs    Baseline  Eval:  limited UE strength    Time  12    Period  Weeks    Status  New    Target Date  02/14/19       OT LONG TERM GOAL #2   Title  Pt. will perform self-feeding skills with modified independence    Baseline  Eval: Pt. hand difficulty secondary to tremors.    Time  12    Period  Weeks    Status  New    Target Date  02/14/19      OT LONG TERM GOAL #3   Title  Pt. will improve right grip strength to be able to open a drink    Baseline  Eval: Pt. has difficulty    Time  12    Period  Weeks    Status  New    Target Date  02/14/19      OT LONG TERM GOAL #4   Title  Pt. will be independent with making a sandwich    Baseline  Eval: Pt. is unable    Time  12    Period  Weeks    Status  New    Target Date  02/14/19      OT LONG TERM GOAL #5   Title  Pt. will improve [facility] skills to be able to open packets, and packages.    Baseline  Eval: Pt. is unable     Time  12    Status  New    Target Date  02/14/19            Plan - 11/22/18 1722    Clinical Impression Statement  Pt. is a 63 y.o. female who was diagnosed in August 2019 with a CVA. Pt. has a history of seizures, and early onset dementia. Pt. presents with fatigue, decreased activity tolerance, BUE motor control, coordination, and strength which limits her ability to complete ADL, and IADL functioning.  Pt. will benefit from skilled OT services to work on improving UE strength, motor control, [facility] skills, activity tolerance, in order to improve,a nd maximize independence with ADLS, and IADLs as well as to  provide pt./family education about cognitive compensatory strategies, and HEPs.     Occupational Profile and client history currently impacting functional performance  Pt. has adult children, enjoyed going shopping, cooking/baking. Pt.'s daughter reports that her mother "did not like to lay around" and was her motivator.     Occupational performance deficits (Please refer to evaluation for details):  ADL's    Rehab Potential  Good    Current Impairments/barriers affecting progress:  Motivation, cognition, multiple  comorbidities    OT Frequency  2x / week    OT Duration  12 weeks    OT Treatment/Interventions  Self-care/ADL training;DME and/or AE instruction;Therapeutic exercise;Therapeutic activities;Patient/family education;Neuromuscular education    Clinical Decision Making  Several treatment options, min-mod task modification necessary    Consulted and Agree with Plan of Care  Family member/caregiver;Patient    Family Member Consulted  Daughter       Patient will benefit from skilled therapeutic intervention in order to improve the following deficits and impairments:  Decreased cognition, Decreased strength, Impaired UE functional use, Pain, Decreased knowledge of use of DME, Decreased coordination, Decreased endurance, Decreased activity tolerance, Decreased balance  Visit Diagnosis: Muscle weakness (generalized)  Other lack of coordination  Problem List Patient Active Problem List   Diagnosis Date Noted  . 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 CVA (cerebral vascular accident) ([place]) 06/24/2018  . Anxiety 10/08/2015  . Symptomatic bradycardia 10/08/2015  . CAD (coronary artery disease) 04/16/2015  . Chronic constipation 04/16/2015  . HTN (hypertension) 04/16/2015  . Arthritis, degenerative 04/16/2015  . Allergic rhinitis, seasonal 04/16/2015  . Avitaminosis D 04/16/2015  . Depression, major, recurrent, moderate ([place]) 04/15/2015  . Atypical chest pain 06/26/2013  . Brash 06/26/2013  . Arthritis 06/26/2013  . Asthma, intermittent 01/17/2013  . Airway hyperreactivity 07/20/2011  . GERD (gastroesophageal reflux disease) 07/20/2011  . HCD (hypertensive cardiovascular disease) 07/20/2011    Harrel Carina, MS, OTR/L 11/22/2018, 5:49 PM  [place] MAIN [facility] SERVICES [address] [place], [place], [address] Phone: [phone]   Fax:  [phone]  Name: Carly Marks MRN: [id number] Date of Birth: Nov 01, 1956

## 2018-11-23 ENCOUNTER — Ambulatory Visit: Payer: Medicare Other | Admitting: Physical Therapy

## 2018-11-24 ENCOUNTER — Ambulatory Visit: Payer: Medicare Other | Admitting: Physical Therapy

## 2018-11-24 ENCOUNTER — Encounter: Payer: Self-pay | Admitting: Physical Therapy

## 2018-11-24 DIAGNOSIS — M6281 Muscle weakness (generalized): Secondary | ICD-10-CM

## 2018-11-24 DIAGNOSIS — R262 Difficulty in walking, not elsewhere classified: Secondary | ICD-10-CM | POA: Diagnosis not present

## 2018-11-24 DIAGNOSIS — R278 Other lack of coordination: Secondary | ICD-10-CM

## 2018-11-24 DIAGNOSIS — R2689 Other abnormalities of gait and mobility: Secondary | ICD-10-CM

## 2018-11-24 NOTE — Therapy (Signed)
[place] MAIN [facility] SERVICES [address] [place], [place], [address] Phone: [phone]   Fax:  [phone]  Physical Therapy Treatment  Patient Details  Name: Carly Marks MRN: [id number] Date of Birth: [birth year] Referring Provider (PT): BURNS, HARRIETT P   Encounter Date: 11/24/2018  PT End of Session - 11/24/18 1159    Visit Number  5    Number of Visits  17    Date for PT Re-Evaluation  12/19/18    Authorization Type  5/10  ( eval date 10/24/18)    PT Start Time  1150    PT Stop Time  1230    PT Time Calculation (min)  40 min    Equipment Utilized During Treatment  Gait belt    Activity Tolerance  Patient tolerated treatment well;Patient limited by fatigue    Behavior During Therapy  [facility] for tasks assessed/performed;Flat affect       Past Medical History:  Diagnosis Date  . Anemia   . Anxiety   . Arthritis   . Asthma   . CAD (coronary artery disease)    per patient  . Depression   . Family history of colonic polyps   . GERD (gastroesophageal reflux disease)   . HA (headache)   . HTN (hypertension)   . Major depressive disorder, recurrent episode, moderate ([place])   . Obstructive sleep apnea     Past Surgical History:  Procedure Laterality Date  . CARDIAC CATHETERIZATION  02/19/15, 06/2009, 08/2011  . COLONOSCOPY N/A 04/22/2015   Procedure: COLONOSCOPY;  Surgeon: Hulen Luster, MD;  Location: [facility] ENDOSCOPY;  Service: Gastroenterology;  Laterality: N/A;  . TUBAL LIGATION      There were no vitals filed for this visit.  Subjective Assessment - 11/24/18 1157    Subjective  Patient is hurting today, 5/10 b LE. She wants to be more active.     Patient is accompained by:  Family member    Pertinent History  Patient was inhospital Aug 16th for one night and then she went home. She does not get up to walk, can not open bottles.  She was up cleaning the house and would go out and run errands. Now she is not getting out of bed  and spends most of the day in bed. This has been going on for 6 months. She had a seizure August 16th.     How long can you stand comfortably?  15 mins    How long can you walk comfortably?  20 mins    Patient Stated Goals  to be more active     Currently in Pain?  Yes    Pain Score  5     Pain Location  Leg    Pain Orientation  Right;Left    Pain Descriptors / Indicators  Aching    Pain Type  Chronic pain    Pain Onset  Today        Ther-ex  Gait training with spc 50 feet x 4 with CGA , cues for posture and speed LAQ  with 3 second holds x10 each LE  Marching in sitting x 20 BLE STS from chair without UE support 2x10  Mini squats x10 with no UE support  Toe taps to 6" step without UE support alternating LE x15 each  Forward and backward stepping over 1/2 foam x15 each direction  SIde stepping over 1/2 foam 15x each direction  Verbal cues for correct technique , and posture  correction                         PT Education - 11/24/18 1158    Education Details  HEP    Person(s) Educated  Patient    Methods  Explanation    Comprehension  Verbalized understanding       PT Short Term Goals - 10/24/18 1013      PT SHORT TERM GOAL #1   Title  Patient will be independent in home exercise program to improve strength/mobility for better functional independence with ADLs.    Time  4    Period  Weeks    Status  New    Target Date  11/21/18        PT Long Term Goals - 10/24/18 1013      PT LONG TERM GOAL #1   Title  Patient (> 63 years old) will complete five times sit to stand test in < 15 seconds indicating an increased LE strength and improved balance    Time  8    Period  Weeks    Status  New    Target Date  12/19/18      PT LONG TERM GOAL #2   Title  Patient will increase 10 meter walk test to >1.89m/s as to improve gait speed for better community ambulation and to reduce fall risk.    Time  8    Period  Weeks    Status  New    Target Date   12/19/18      PT LONG TERM GOAL #3   Title  Patient will reduce timed up and go to <11 seconds to reduce fall risk and demonstrate improved transfer/gait ability.    Time  8    Period  Weeks    Status  New    Target Date  12/19/18            Plan - 11/24/18 1159    Clinical Impression Statement  Patient arrived 5 mins late. She performed LE exericses with max VC and delay in verbal responses . She reports pain in BLE today. She has slow movements and slow mobility . She will conitnue to benefit from skilled PT to improve mobiity and strength.     Rehab Potential  Fair    PT Frequency  2x / week    PT Duration  8 weeks    PT Treatment/Interventions  Manual techniques;Neuromuscular re-education;Patient/family education;Balance training;Therapeutic activities;Therapeutic exercise;Gait training    PT Home Exercise Plan  BRIDGES, Hooklying marching, standing hip abd, hip ext    Consulted and Agree with Plan of Care  Patient;Family member/caregiver       Patient will benefit from skilled therapeutic intervention in order to improve the following deficits and impairments:  Abnormal gait, Decreased balance, Decreased endurance, Difficulty walking, Decreased mobility, Obesity, Pain, Decreased strength, Decreased safety awareness, Decreased activity tolerance  Visit Diagnosis: Muscle weakness (generalized)  Other lack of coordination  Difficulty in walking, not elsewhere classified  Other abnormalities of gait and mobility     Problem List Patient Active Problem List   Diagnosis Date Noted  . Stroke (cerebrum) ([place]) 06/25/2018  . CVA (cerebral vascular accident) ([place]) 06/24/2018  . Anxiety 10/08/2015  . Symptomatic bradycardia 10/08/2015  . CAD (coronary artery disease) 04/16/2015  . Chronic constipation 04/16/2015  . HTN (hypertension) 04/16/2015  . Arthritis, degenerative 04/16/2015  . Allergic rhinitis, seasonal 04/16/2015  . Avitaminosis D 04/16/2015  .  Depression,  major, recurrent, moderate ([place]) 04/15/2015  . Atypical chest pain 06/26/2013  . Brash 06/26/2013  . Arthritis 06/26/2013  . Asthma, intermittent 01/17/2013  . Airway hyperreactivity 07/20/2011  . GERD (gastroesophageal reflux disease) 07/20/2011  . HCD (hypertensive cardiovascular disease) 07/20/2011    Alanson Puls , PT DPT 11/24/2018, 12:04 PM  [place] MAIN [facility] SERVICES [address] [place], [place], [address] Phone: [phone]   Fax:  [phone]  Name: MORGANNA STYLES MRN: [id number] Date of Birth: 1956-05-25

## 2018-11-28 ENCOUNTER — Ambulatory Visit: Payer: Medicare Other | Admitting: Physical Therapy

## 2018-11-30 ENCOUNTER — Encounter: Payer: Medicare Other | Admitting: Occupational Therapy

## 2018-11-30 ENCOUNTER — Ambulatory Visit: Payer: Medicare Other

## 2018-11-30 ENCOUNTER — Encounter: Payer: Self-pay | Admitting: Occupational Therapy

## 2018-11-30 ENCOUNTER — Ambulatory Visit: Payer: Medicare Other | Admitting: Occupational Therapy

## 2018-11-30 DIAGNOSIS — M6281 Muscle weakness (generalized): Secondary | ICD-10-CM

## 2018-11-30 DIAGNOSIS — R262 Difficulty in walking, not elsewhere classified: Secondary | ICD-10-CM | POA: Diagnosis not present

## 2018-11-30 DIAGNOSIS — R278 Other lack of coordination: Secondary | ICD-10-CM

## 2018-11-30 DIAGNOSIS — R2689 Other abnormalities of gait and mobility: Secondary | ICD-10-CM

## 2018-11-30 NOTE — Therapy (Signed)
[place] MAIN [facility] SERVICES [address] [place], [place], [address] Phone: [phone]   Fax:  [phone]  Occupational Therapy Treatment  Patient Details  Name: Carly Marks MRN: [id number] Date of Birth: 16-Jul-1956 Referring Provider (OT): Quay Burow, [place]   Encounter Date: 11/30/2018  OT End of Session - 11/30/18 1608    Visit Number  2    Number of Visits  24    Date for OT Re-Evaluation  02/14/19    Authorization Type  Progress reports period beginning 11/22/2018.    OT Start Time  1100    OT Stop Time  1145    OT Time Calculation (min)  45 min    Activity Tolerance  Patient tolerated treatment well;Patient limited by fatigue    Behavior During Therapy  [facility] for tasks assessed/performed;Flat affect       Past Medical History:  Diagnosis Date  . Anemia   . Anxiety   . Arthritis   . Asthma   . CAD (coronary artery disease)    per patient  . Depression   . Family history of colonic polyps   . GERD (gastroesophageal reflux disease)   . HA (headache)   . HTN (hypertension)   . Major depressive disorder, recurrent episode, moderate ([place])   . Obstructive sleep apnea     Past Surgical History:  Procedure Laterality Date  . CARDIAC CATHETERIZATION  02/19/15, 06/2009, 08/2011  . COLONOSCOPY N/A 04/22/2015   Procedure: COLONOSCOPY;  Surgeon: Hulen Luster, MD;  Location: [facility] ENDOSCOPY;  Service: Gastroenterology;  Laterality: N/A;  . TUBAL LIGATION      There were no vitals filed for this visit.  Subjective Assessment - 11/30/18 1607    Subjective   Pt.'s daughter, and son drove the pt. today.    Patient is accompained by:  Family member    Pertinent History  Pt. is a 63 y.o. female who was diagnosed with a CVA on 06/24/2018. Pt. has been receiving outpatient PT services, and is now appropriate for outpatient OT services.  Pt. has a history of Seizures. Pt.'s daughter reports that she has been diagnosed with early dementia  recently, and is scheduled to be evaluated by specialists in [place] in March of 2020. Pt. has supportive children, and resides with her daughter who assists the patient with ADLs, and IADLs.  Pt. is waiting for home care aides through the [facility] program.    Currently in Pain?  No/denies      OT TREATMENT    Neuro muscular re-education:  Pt. worked on grasping 1" resistive cubes using thumb opposition to the tip of the 2nd digit while the board is placed at a vertical angle. Pt. worked on pressing the cubes back into place while usng her 2nd digit.  Therapeutic Exercise:  Pt. performed Right gross gripping with grip strengthener. Pt. worked on sustaining grip while grasping pegs and reaching at various heights. The gripper was placed in the 2nd  resistive slot with the white resistive spring, and modified to the 1st resistive slot 1/2 way through the task Pt. Worked on pinch strengthening in the left hand for lateral, and 3pt. pinch using yellow, red,and green resistive clips. Pt. worked on placing the clips at various vertical and horizontal angles. Tactile and verbal cues were required for eliciting the desired movement.  OT Short Term Goals - 11/22/18 1741      OT SHORT TERM GOAL #1   Title  --    Baseline  --        OT Long Term Goals - 11/22/18 1742      OT LONG TERM GOAL #1   Title  Pt. will increase UE strength by 2 mm grades to assist with ADLs, and IADLs    Baseline  Eval:  limited UE strength    Time  12    Period  Weeks    Status  New    Target Date  02/14/19      OT LONG TERM GOAL #2   Title  Pt. will perform self-feeding skills with modified independence    Baseline  Eval: Pt. hand difficulty secondary to tremors.    Time  12    Period  Weeks    Status  New    Target Date  02/14/19      OT LONG TERM GOAL #3   Title  Pt. will improve right grip strength to be able to open a drink    Baseline  Eval: Pt. has  difficulty    Time  12    Period  Weeks    Status  New    Target Date  02/14/19      OT LONG TERM GOAL #4   Title  Pt. will be independent with making a sandwich    Baseline  Eval: Pt. is unable    Time  12    Period  Weeks    Status  New    Target Date  02/14/19      OT LONG TERM GOAL #5   Title  Pt. will improve [facility] skills to be able to open packets, and packages.    Baseline  Eval: Pt. is unable     Time  12    Status  New    Target Date  02/14/19            Plan - 11/30/18 1608    Clinical Impression Statement Pt. continues to present with decreased task initiation, and limited initiation of conversation. Pt. requires verbal, and tactile cues, as well as increased time to complete. Pt. continues to work on improving UE strength, and coordination skills in order to be able to feed herself, make a sandwich, grip and hold a bottle, and open containers.     Occupational Profile and client history currently impacting functional performance  Pt. has adult children, enjoyed going shopping, cooking/baking. Pt.'s daughter reports that her mother "did not like to lay around" and was her motivator.     Occupational performance deficits (Please refer to evaluation for details):  ADL's    Rehab Potential  Good    Current Impairments/barriers affecting progress:  Motivation, cognition, multiple comorbidities    OT Frequency  2x / week    OT Duration  12 weeks    OT Treatment/Interventions  Self-care/ADL training;DME and/or AE instruction;Therapeutic exercise;Therapeutic activities;Patient/family education;Neuromuscular education    Clinical Decision Making  Several treatment options, min-mod task modification necessary    Consulted and Agree with Plan of Care  Family member/caregiver;Patient    Family Member Consulted  Daughter       Patient will benefit from skilled therapeutic intervention in order to improve the following deficits and impairments:  Decreased cognition, Decreased  strength, Impaired UE functional use, Pain, Decreased knowledge of use of DME, Decreased coordination, Decreased endurance, Decreased activity  tolerance, Decreased balance  Visit Diagnosis: Muscle weakness (generalized)  Other lack of coordination    Problem List Patient Active Problem List   Diagnosis Date Noted  . Stroke (cerebrum) ([place]) 06/25/2018  . CVA (cerebral vascular accident) ([place]) 06/24/2018  . Anxiety 10/08/2015  . Symptomatic bradycardia 10/08/2015  . CAD (coronary artery disease) 04/16/2015  . Chronic constipation 04/16/2015  . HTN (hypertension) 04/16/2015  . Arthritis, degenerative 04/16/2015  . Allergic rhinitis, seasonal 04/16/2015  . Avitaminosis D 04/16/2015  . Depression, major, recurrent, moderate ([place]) 04/15/2015  . Atypical chest pain 06/26/2013  . Brash 06/26/2013  . Arthritis 06/26/2013  . Asthma, intermittent 01/17/2013  . Airway hyperreactivity 07/20/2011  . GERD (gastroesophageal reflux disease) 07/20/2011  . HCD (hypertensive cardiovascular disease) 07/20/2011    Harrel Carina, MS, OTR/L 11/30/2018, 4:17 PM  [place] MAIN [facility] SERVICES [address] [place], [place], [address] Phone: [phone]   Fax:  [phone]  Name: Carly Marks MRN: [id number] Date of Birth: May 13, 1956

## 2018-11-30 NOTE — Therapy (Signed)
[place] MAIN [facility] SERVICES [address] [place], [place], [address] Phone: [phone]   Fax:  [phone]  Physical Therapy Treatment  Patient Details  Name: Carly Marks MRN: [id number] Date of Birth: May 15, 1956 Referring Provider (PT): BURNS, HARRIETT P   Encounter Date: 11/30/2018  PT End of Session - 11/30/18 1031    Visit Number  6    Number of Visits  17    Date for PT Re-Evaluation  12/19/18    Authorization Type  5/10  ( eval date 10/24/18)    PT Start Time  1024    PT Stop Time  1058    PT Time Calculation (min)  34 min    Activity Tolerance  Patient tolerated treatment well;Patient limited by fatigue    Behavior During Therapy  [facility] for tasks assessed/performed;Flat affect       Past Medical History:  Diagnosis Date  . Anemia   . Anxiety   . Arthritis   . Asthma   . CAD (coronary artery disease)    per patient  . Depression   . Family history of colonic polyps   . GERD (gastroesophageal reflux disease)   . HA (headache)   . HTN (hypertension)   . Major depressive disorder, recurrent episode, moderate ([place])   . Obstructive sleep apnea     Past Surgical History:  Procedure Laterality Date  . CARDIAC CATHETERIZATION  02/19/15, 06/2009, 08/2011  . COLONOSCOPY N/A 04/22/2015   Procedure: COLONOSCOPY;  Surgeon: Hulen Luster, MD;  Location: [facility] ENDOSCOPY;  Service: Gastroenterology;  Laterality: N/A;  . TUBAL LIGATION      There were no vitals filed for this visit.  Subjective Assessment - 11/30/18 1030    Subjective  Pt doing well todday, reports good HEP compliance, denies HEP questions, and reports no pain.     Pertinent History  Patient was inhospital Aug 16th for one night and then she went home. She does not get up to walk, can not open bottles.  She was up cleaning the house and would go out and run errands. Now she is not getting out of bed and spends most of the day in bed. This has been going on for 6  months. She had a seizure August 16th.     Currently in Pain?  No/denies       Intervention this date:  -Gait training with spc 60 feet x 2 with CGA , cues for posture and speed 0.56m/s, 2-point step-to gaitwith SPC RUE  -LAQ with 3 second holds x10 each LE  -Marching in sitting 3x5 bilat, AA/ROM for full ROM d/t quick onset fatigue  -STS from chair without UE support 2x10  -BUE reach to floor and back up: 2x10   *unable to get to the follow d/t time constraints this date:  Mini squats x10 with no UE support  Toe taps to 6" step without UE support alternating LE x15 each  Forward and backward stepping over 1/2 foam x15 each direction  SIde stepping over 1/2 foam 15x each direction    Verbal cues for correct technique , and posture  correction, form and quality of movement during exercises.     PT Short Term Goals - 10/24/18 1013      PT SHORT TERM GOAL #1   Title  Patient will be independent in home exercise program to improve strength/mobility for better functional independence with ADLs.    Time  4    Period  Weeks    Status  New    Target Date  11/21/18        PT Long Term Goals - 10/24/18 1013      PT LONG TERM GOAL #1   Title  Patient (> 36 years old) will complete five times sit to stand test in < 15 seconds indicating an increased LE strength and improved balance    Time  8    Period  Weeks    Status  New    Target Date  12/19/18      PT LONG TERM GOAL #2   Title  Patient will increase 10 meter walk test to >1.41m/s as to improve gait speed for better community ambulation and to reduce fall risk.    Time  8    Period  Weeks    Status  New    Target Date  12/19/18      PT LONG TERM GOAL #3   Title  Patient will reduce timed up and go to <11 seconds to reduce fall risk and demonstrate improved transfer/gait ability.    Time  8    Period  Weeks    Status  New    Target Date  12/19/18            Plan - 11/30/18 1032    Clinical Impression Statement   Pt arrives for session in good spirits, no significant pain or fatigue. Throughout session, mostly flat affect and delayed response, often without verbal response at times. THroughout session, pt attend to task, but requires constant VC for movement qulaity, as with most activities, as she surpassesthe first 4-5 reps, movement aplitude begins to dimmiish exponentially. D/t aforementioned cogntiive status, difficult to determine if pt is fully understanding instructions or simply fatigued and delayed. Pt continues to put forth er good effort overall, but need close supervision for exercises.     Rehab Potential  Fair    PT Frequency  2x / week    PT Duration  8 weeks    PT Treatment/Interventions  Manual techniques;Neuromuscular re-education;Patient/family education;Balance training;Therapeutic activities;Therapeutic exercise;Gait training    PT Home Exercise Plan  BRIDGES, Hooklying marching, standing hip abd, hip ext    Consulted and Agree with Plan of Care  Patient       Patient will benefit from skilled therapeutic intervention in order to improve the following deficits and impairments:  Abnormal gait, Decreased balance, Decreased endurance, Difficulty walking, Decreased mobility, Obesity, Pain, Decreased strength, Decreased safety awareness, Decreased activity tolerance  Visit Diagnosis: Muscle weakness (generalized)  Other lack of coordination  Difficulty in walking, not elsewhere classified  Other abnormalities of gait and mobility     Problem List Patient Active Problem List   Diagnosis Date Noted  . Stroke (cerebrum) ([place]) 06/25/2018  . CVA (cerebral vascular accident) ([place]) 06/24/2018  . Anxiety 10/08/2015  . Symptomatic bradycardia 10/08/2015  . CAD (coronary artery disease) 04/16/2015  . Chronic constipation 04/16/2015  . HTN (hypertension) 04/16/2015  . Arthritis, degenerative 04/16/2015  . Allergic rhinitis, seasonal 04/16/2015  . Avitaminosis D 04/16/2015  .  Depression, major, recurrent, moderate ([place]) 04/15/2015  . Atypical chest pain 06/26/2013  . Brash 06/26/2013  . Arthritis 06/26/2013  . Asthma, intermittent 01/17/2013  . Airway hyperreactivity 07/20/2011  . GERD (gastroesophageal reflux disease) 07/20/2011  . HCD (hypertensive cardiovascular disease) 07/20/2011   10:56 AM, 11/30/18 Etta Grandchild, PT, DPT Physical Therapist - [facility]  Outpatient Physical Therapy- Main  Campus [phone]     Etta Grandchild 11/30/2018, 10:55 AM  [place] MAIN [facility] SERVICES [address] [place], [place], [address] Phone: [phone]   Fax:  [phone]  Name: Carly Marks MRN: [id number] Date of Birth: Sep 12, 1956

## 2018-12-05 ENCOUNTER — Ambulatory Visit: Payer: Medicare Other

## 2018-12-05 DIAGNOSIS — R2689 Other abnormalities of gait and mobility: Secondary | ICD-10-CM

## 2018-12-05 DIAGNOSIS — R278 Other lack of coordination: Secondary | ICD-10-CM

## 2018-12-05 DIAGNOSIS — R262 Difficulty in walking, not elsewhere classified: Secondary | ICD-10-CM

## 2018-12-05 DIAGNOSIS — M6281 Muscle weakness (generalized): Secondary | ICD-10-CM

## 2018-12-05 NOTE — Therapy (Signed)
[place] MAIN [facility] SERVICES [address] [place], [place], [address] Phone: [phone]   Fax:  [phone]  Physical Therapy Treatment  Patient Details  Name: Carly Marks MRN: [id number] Date of Birth: 11-04-1956 Referring Provider (PT): BURNS, HARRIETT P   Encounter Date: 12/05/2018  PT End of Session - 12/05/18 1440    Visit Number  7    Number of Visits  17    Date for PT Re-Evaluation  12/19/18    Authorization Type  5/10  ( eval date 10/24/18)    PT Start Time  1436   Pt arrived late   PT Stop Time  1515    PT Time Calculation (min)  39 min    Activity Tolerance  Patient tolerated treatment well;Patient limited by fatigue    Behavior During Therapy  [facility] for tasks assessed/performed;Flat affect       Past Medical History:  Diagnosis Date  . Anemia   . Anxiety   . Arthritis   . Asthma   . CAD (coronary artery disease)    per patient  . Depression   . Family history of colonic polyps   . GERD (gastroesophageal reflux disease)   . HA (headache)   . HTN (hypertension)   . Major depressive disorder, recurrent episode, moderate ([place])   . Obstructive sleep apnea     Past Surgical History:  Procedure Laterality Date  . CARDIAC CATHETERIZATION  02/19/15, 06/2009, 08/2011  . COLONOSCOPY N/A 04/22/2015   Procedure: COLONOSCOPY;  Surgeon: Hulen Luster, MD;  Location: [facility] ENDOSCOPY;  Service: Gastroenterology;  Laterality: N/A;  . TUBAL LIGATION      There were no vitals filed for this visit.  Subjective Assessment - 12/05/18 1439    Subjective  Pt says she is feelign good today. Reports her AMB at home is improving with practice. She says HEP is also getting easier.     Pertinent History  Patient was inhospital Aug 16th for one night and then she went home. She does not get up to walk, can not open bottles.  She was up cleaning the house and would go out and run errands. Now she is not getting out of bed and spends most of the  day in bed. This has been going on for 6 months. She had a seizure August 16th.     Currently in Pain?  No/denies       Intervention This Date: -AMB intervals with SPC--> 2x141ft, rest between (avg 0.28m/s); req minGuard assist, SPC in RUE, 2-point step-to gait  (twice as fast as last session) -STS from plinth 1x10 hands free -Lateral step-over  *attempted with 1/2 foam roll, too wide, pt unable to create step width, MinAssist  -1x10 with stepping over SPC, difficulty with Rt stepping, unable to go wide enough, minAssist  -1x10 over straight weight, with SPC in RUE; Step width does not correct despite extensive verbal cuing. Pt also given extensive verbal cuing for use of SPC in RUE for this task and is unable to cognitively process instructions for stepping, SPC becoming more of a safety concern than assistive device.  -Fwd step taps Alternating 1x20 (heavy tactile/verbal cues to attend to task, multiple times tries to turn into fwd step-ups)  *right gluteal fatigue very limiting over time.   *SPC in RUE for balance training -SAQ on bolster, 1x10 5lb cuffs (requires supervision/tactile cues for attending to task)      PT Short Term Goals - 10/24/18  [place] #1   Title  Patient will be independent in home exercise program to improve strength/mobility for better functional independence with ADLs.    Time  4    Period  Weeks    Status  New    Target Date  11/21/18        PT Long Term Goals - 10/24/18 1013      PT LONG TERM GOAL #1   Title  Patient (> 31 years old) will complete five times sit to stand test in < 15 seconds indicating an increased LE strength and improved balance    Time  8    Period  Weeks    Status  New    Target Date  12/19/18      PT LONG TERM GOAL #2   Title  Patient will increase 10 meter walk test to >1.65m/s as to improve gait speed for better community ambulation and to reduce fall risk.    Time  8    Period  Weeks    Status  New     Target Date  12/19/18      PT LONG TERM GOAL #3   Title  Patient will reduce timed up and go to <11 seconds to reduce fall risk and demonstrate improved transfer/gait ability.    Time  8    Period  Weeks    Status  New    Target Date  12/19/18            Plan - 12/05/18 1444    Clinical Impression Statement  Continued with current program this date. Pt appears to have a bit more energythis session. AMB intervals progressed to 131ft, nearly 100% increase since last session. SPC sequencing looks safe, continued 2-point RUE  step-to gait. Turns remain most difficult. Pt struggles with more complex gait/SPC training activity, and stepping tasks, likely cognitive but also related to weakness in hips.    Rehab Potential  Fair    PT Frequency  2x / week    PT Duration  8 weeks    PT Treatment/Interventions  Manual techniques;Neuromuscular re-education;Patient/family education;Balance training;Therapeutic activities;Therapeutic exercise;Gait training    PT Next Visit Plan  Continue to progress AMB tolerance c SPC. Progress strength and dynamic balance as able.     PT Home Exercise Plan  BRIDGES, Hooklying marching, standing hip abd, hip ext    Consulted and Agree with Plan of Care  Patient       Patient will benefit from skilled therapeutic intervention in order to improve the following deficits and impairments:  Abnormal gait, Decreased balance, Decreased endurance, Difficulty walking, Decreased mobility, Obesity, Pain, Decreased strength, Decreased safety awareness, Decreased activity tolerance  Visit Diagnosis: Muscle weakness (generalized)  Difficulty in walking, not elsewhere classified  Other abnormalities of gait and mobility  Other lack of coordination     Problem List Patient Active Problem List   Diagnosis Date Noted  . Stroke (cerebrum) ([place]) 06/25/2018  . CVA (cerebral vascular accident) ([place]) 06/24/2018  . Anxiety 10/08/2015  . Symptomatic bradycardia  10/08/2015  . CAD (coronary artery disease) 04/16/2015  . Chronic constipation 04/16/2015  . HTN (hypertension) 04/16/2015  . Arthritis, degenerative 04/16/2015  . Allergic rhinitis, seasonal 04/16/2015  . Avitaminosis D 04/16/2015  . Depression, major, recurrent, moderate ([place]) 04/15/2015  . Atypical chest pain 06/26/2013  . Brash 06/26/2013  . Arthritis 06/26/2013  . Asthma, intermittent 01/17/2013  . Airway hyperreactivity 07/20/2011  .  GERD (gastroesophageal reflux disease) 07/20/2011  . HCD (hypertensive cardiovascular disease) 07/20/2011    3:15 PM, 12/05/18 Etta Grandchild, PT, DPT Physical Therapist - [place] [phone]     Etta Grandchild 12/05/2018, 2:51 PM  [place] MAIN [facility] SERVICES [address] [place], [place], [address] Phone: [phone]   Fax:  [phone]  Name: HAYES REHFELDT MRN: [id number] Date of Birth: 19-Sep-1956

## 2018-12-07 ENCOUNTER — Encounter: Payer: Self-pay | Admitting: Occupational Therapy

## 2018-12-07 ENCOUNTER — Ambulatory Visit: Payer: Medicare Other

## 2018-12-07 ENCOUNTER — Ambulatory Visit: Payer: Medicare Other | Admitting: Occupational Therapy

## 2018-12-07 DIAGNOSIS — R2689 Other abnormalities of gait and mobility: Secondary | ICD-10-CM

## 2018-12-07 DIAGNOSIS — M6281 Muscle weakness (generalized): Secondary | ICD-10-CM

## 2018-12-07 DIAGNOSIS — R262 Difficulty in walking, not elsewhere classified: Secondary | ICD-10-CM

## 2018-12-07 DIAGNOSIS — R278 Other lack of coordination: Secondary | ICD-10-CM

## 2018-12-07 NOTE — Therapy (Signed)
[place] MAIN [facility] SERVICES [address] [place], [place], [address] Phone: [phone]   Fax:  [phone]  Occupational Therapy Treatment  Patient Details  Name: Carly Marks MRN: [id number] Date of Birth: 12-03-1955 Referring Provider (OT): Quay Burow, [place]   Encounter Date: 12/07/2018  OT End of Session - 12/07/18 1005    Visit Number  3    Number of Visits  24    Date for OT Re-Evaluation  02/14/19    Authorization Type  Progress reports period beginning 11/22/2018.    OT Start Time  0932    OT Stop Time  1015    OT Time Calculation (min)  43 min    Activity Tolerance  Patient tolerated treatment well;Patient limited by fatigue    Behavior During Therapy  [facility] for tasks assessed/performed;Flat affect       Past Medical History:  Diagnosis Date  . Anemia   . Anxiety   . Arthritis   . Asthma   . CAD (coronary artery disease)    per patient  . Depression   . Family history of colonic polyps   . GERD (gastroesophageal reflux disease)   . HA (headache)   . HTN (hypertension)   . Major depressive disorder, recurrent episode, moderate ([place])   . Obstructive sleep apnea     Past Surgical History:  Procedure Laterality Date  . CARDIAC CATHETERIZATION  02/19/15, 06/2009, 08/2011  . COLONOSCOPY N/A 04/22/2015   Procedure: COLONOSCOPY;  Surgeon: Hulen Luster, MD;  Location: [facility] ENDOSCOPY;  Service: Gastroenterology;  Laterality: N/A;  . TUBAL LIGATION      There were no vitals filed for this visit.  Subjective Assessment - 12/07/18 1003    Subjective   Pt.'s daughter was present today.    Patient is accompained by:  Family member    Pertinent History  Pt. is a 63 y.o. female who was diagnosed with a CVA on 06/24/2018. Pt. has been receiving outpatient PT services, and is now appropriate for outpatient OT services.  Pt. has a history of Seizures. Pt.'s daughter reports that she has been diagnosed with early dementia recently, and is  scheduled to be evaluated by specialists in [place] in March of 2020. Pt. has supportive children, and resides with her daughter who assists the patient with ADLs, and IADLs.  Pt. is waiting for home care aides through the [facility] program.    Currently in Pain?  No/denies      OT TREATMENT    Neuro muscular re-education:  Pt. performed [place] tasks using the grooved pegboard. Pt. worked on grasping the grooved pegs from a horizontal position, and moving the pegs to a vertical position in the hand to prepare for placing them in the grooved slot. Verbal cues for initiation, verbal cues for direction, and visual demonstration, and Increased time were required to complete.  Therapeutic Exercise:  Pt. Attempted BUE strengthening with 1.5# dowel. Extensive cues, and assist was required with follow-through. Pt. performed gross gripping with grip strengthener. Pt. worked on sustaining grip while grasping pegs and reaching at various heights. The Gripper was placed in the 2nd resistive slot with the white resistive spring. Pt. Tolerated this task well.                            OT Short Term Goals - 11/22/18 1741      OT SHORT TERM GOAL #1   Title  --  Baseline  --        OT Long Term Goals - 11/22/18 1742      OT LONG TERM GOAL #1   Title  Pt. will increase UE strength by 2 mm grades to assist with ADLs, and IADLs    Baseline  Eval:  limited UE strength    Time  12    Period  Weeks    Status  New    Target Date  02/14/19      OT LONG TERM GOAL #2   Title  Pt. will perform self-feeding skills with modified independence    Baseline  Eval: Pt. hand difficulty secondary to tremors.    Time  12    Period  Weeks    Status  New    Target Date  02/14/19      OT LONG TERM GOAL #3   Title  Pt. will improve right grip strength to be able to open a drink    Baseline  Eval: Pt. has difficulty    Time  12    Period  Weeks    Status  New    Target Date  02/14/19       OT LONG TERM GOAL #4   Title  Pt. will be independent with making a sandwich    Baseline  Eval: Pt. is unable    Time  12    Period  Weeks    Status  New    Target Date  02/14/19      OT LONG TERM GOAL #5   Title  Pt. will improve [facility] skills to be able to open packets, and packages.    Baseline  Eval: Pt. is unable     Time  12    Status  New    Target Date  02/14/19            Plan - 12/07/18 1006    Clinical Impression Statement  Pt. presents with limited tasks initiation, and initiation of conversation. Pt. answers questions, however occassionally requires increased time to respond to the questions. Pt. reports enjoying crossword puzzles. Pt. continues to present with limited Bilateral UE strength, and [facility] skills. Pt. continues to work on these tasks in order to be able to open bottles, packets, and containers, and perform self-feeding skills.    Occupational Profile and client history currently impacting functional performance  Pt. has adult children, enjoyed going shopping, cooking/baking. Pt.'s daughter reports that her mother "did not like to lay around" and was her motivator.     Occupational performance deficits (Please refer to evaluation for details):  ADL's    Rehab Potential  Good    Current Impairments/barriers affecting progress:  Motivation, cognition, multiple comorbidities    OT Frequency  2x / week    OT Duration  12 weeks    OT Treatment/Interventions  Self-care/ADL training;DME and/or AE instruction;Therapeutic exercise;Therapeutic activities;Patient/family education;Neuromuscular education    Clinical Decision Making  Several treatment options, min-mod task modification necessary    Consulted and Agree with Plan of Care  Family member/caregiver;Patient    Family Member Consulted  Daughter       Patient will benefit from skilled therapeutic intervention in order to improve the following deficits and impairments:  Decreased cognition, Decreased strength,  Impaired UE functional use, Pain, Decreased knowledge of use of DME, Decreased coordination, Decreased endurance, Decreased activity tolerance, Decreased balance  Visit Diagnosis: Muscle weakness (generalized)  Other lack of coordination    Problem List  Patient Active Problem List   Diagnosis Date Noted  . Stroke (cerebrum) ([place]) 06/25/2018  . CVA (cerebral vascular accident) ([place]) 06/24/2018  . Anxiety 10/08/2015  . Symptomatic bradycardia 10/08/2015  . CAD (coronary artery disease) 04/16/2015  . Chronic constipation 04/16/2015  . HTN (hypertension) 04/16/2015  . Arthritis, degenerative 04/16/2015  . Allergic rhinitis, seasonal 04/16/2015  . Avitaminosis D 04/16/2015  . Depression, major, recurrent, moderate ([place]) 04/15/2015  . Atypical chest pain 06/26/2013  . Brash 06/26/2013  . Arthritis 06/26/2013  . Asthma, intermittent 01/17/2013  . Airway hyperreactivity 07/20/2011  . GERD (gastroesophageal reflux disease) 07/20/2011  . HCD (hypertensive cardiovascular disease) 07/20/2011    Harrel Carina, MS, OTR/L 12/07/2018, 4:37 PM  [place] MAIN [facility] SERVICES [address] [place], [place], [address] Phone: [phone]   Fax:  [phone]  Name: Carly Marks MRN: [id number] Date of Birth: 1956/01/26

## 2018-12-08 ENCOUNTER — Ambulatory Visit
Admission: RE | Admit: 2018-12-08 | Discharge: 2018-12-08 | Disposition: A | Discharge status: Home / Self Care | Payer: Medicare Other | Source: Ambulatory Visit | Attending: Internal Medicine | Admitting: Internal Medicine

## 2018-12-08 DIAGNOSIS — Z1231 Encounter for screening mammogram for malignant neoplasm of breast: Secondary | ICD-10-CM

## 2018-12-08 NOTE — Therapy (Signed)
[place] MAIN [facility] SERVICES [address] [place], [place], [address] Phone: [phone]   Fax:  [phone]  Physical Therapy Treatment  Patient Details  Name: Carly Marks MRN: [id number] Date of Birth: 02-Apr-1956 Referring Provider (PT): BURNS, HARRIETT P   Encounter Date: 12/07/2018  PT End of Session - 12/07/18 1021    Visit Number  8    Number of Visits  17    Date for PT Re-Evaluation  12/19/18    Authorization Type  5/10  ( eval date 10/24/18)    PT Start Time  1016    PT Stop Time  1056    PT Time Calculation (min)  40 min    Activity Tolerance  Patient tolerated treatment well;Patient limited by fatigue    Behavior During Therapy  [facility] for tasks assessed/performed;Flat affect       Past Medical History:  Diagnosis Date  . Anemia   . Anxiety   . Arthritis   . Asthma   . CAD (coronary artery disease)    per patient  . Depression   . Family history of colonic polyps   . GERD (gastroesophageal reflux disease)   . HA (headache)   . HTN (hypertension)   . Major depressive disorder, recurrent episode, moderate ([place])   . Obstructive sleep apnea     Past Surgical History:  Procedure Laterality Date  . CARDIAC CATHETERIZATION  02/19/15, 06/2009, 08/2011  . COLONOSCOPY N/A 04/22/2015   Procedure: COLONOSCOPY;  Surgeon: Hulen Luster, MD;  Location: [facility] ENDOSCOPY;  Service: Gastroenterology;  Laterality: N/A;  . TUBAL LIGATION      There were no vitals filed for this visit.  Subjective Assessment - 12/07/18 1020    Subjective  Pt doing well, just finished OT. Reports she tok a nap after last PT sessions.        Intervention this Date:  -Walking intervals, SPC: 5x173ft, VC for bigger steps, able to maintain step-through pattern ><75% of distance.  ~0.23m/s (continued improvement over prior 2 sessions)  -Side stepping in // bars 2 laps q side, 2 sets (cognitively difficult to perform as cued)   *ultimately moves laterally  with ipsilateral hip flexion and contrlateral posterolateral extension - NUSTEP at end of session, once AMB capacity has dwindled 2/2 fatigue. Seat 10, Arms 9, tolerates 4 minutes, 1 minutes on level 2, 1 min on level 1, 2 min on level 0  *heavy verbal cues required for continuous movement, sometimes active assistance to keep machine moving, pt challenged by attending to task.      PT Short Term Goals - 10/24/18 1013      PT SHORT TERM GOAL #1   Title  Patient will be independent in home exercise program to improve strength/mobility for better functional independence with ADLs.    Time  4    Period  Weeks    Status  New    Target Date  11/21/18        PT Long Term Goals - 10/24/18 1013      PT LONG TERM GOAL #1   Title  Patient (> 66 years old) will complete five times sit to stand test in < 15 seconds indicating an increased LE strength and improved balance    Time  8    Period  Weeks    Status  New    Target Date  12/19/18      PT LONG TERM GOAL #2   Title  Patient will increase 10 meter walk test to >1.91m/s as to improve gait speed for better community ambulation and to reduce fall risk.    Time  8    Period  Weeks    Status  New    Target Date  12/19/18      PT LONG TERM GOAL #3   Title  Patient will reduce timed up and go to <11 seconds to reduce fall risk and demonstrate improved transfer/gait ability.    Time  8    Period  Weeks    Status  New    Target Date  12/19/18            Plan - 12/07/18 1034    Clinical Impression Statement  Continued to progress AMB tolerance and gait training this date. Broke distance into smaller segments to allow for more steps with consistent quality. Pt able ot perform 5x150ft with nearly all performed with step-through gait. Adequate rest time provided as pt's legs tire easily. Also performed side stepping in bars. Pt engaged in open ended questions and given plenty of response time to stimulate memory recall, pt a bit mor  eengaging thisdate talking about her grandchildren and great-grandchildren. Pt continues to make slow but steady progress overall.      Rehab Potential  Fair    PT Frequency  2x / week    PT Duration  8 weeks    PT Treatment/Interventions  Manual techniques;Neuromuscular re-education;Patient/family education;Balance training;Therapeutic activities;Therapeutic exercise;Gait training    PT Next Visit Plan  Continue to progress AMB tolerance c SPC. Progress strength and dynamic balance as able.     PT Home Exercise Plan  BRIDGES, Hooklying marching, standing hip abd, hip ext    Consulted and Agree with Plan of Care  Patient       Patient will benefit from skilled therapeutic intervention in order to improve the following deficits and impairments:  Abnormal gait, Decreased balance, Decreased endurance, Difficulty walking, Decreased mobility, Obesity, Pain, Decreased strength, Decreased safety awareness, Decreased activity tolerance  Visit Diagnosis: Muscle weakness (generalized)  Difficulty in walking, not elsewhere classified  Other abnormalities of gait and mobility  Other lack of coordination     Problem List Patient Active Problem List   Diagnosis Date Noted  . Stroke (cerebrum) ([place]) 06/25/2018  . CVA (cerebral vascular accident) ([place]) 06/24/2018  . Anxiety 10/08/2015  . Symptomatic bradycardia 10/08/2015  . CAD (coronary artery disease) 04/16/2015  . Chronic constipation 04/16/2015  . HTN (hypertension) 04/16/2015  . Arthritis, degenerative 04/16/2015  . Allergic rhinitis, seasonal 04/16/2015  . Avitaminosis D 04/16/2015  . Depression, major, recurrent, moderate ([place]) 04/15/2015  . Atypical chest pain 06/26/2013  . Brash 06/26/2013  . Arthritis 06/26/2013  . Asthma, intermittent 01/17/2013  . Airway hyperreactivity 07/20/2011  . GERD (gastroesophageal reflux disease) 07/20/2011  . HCD (hypertensive cardiovascular disease) 07/20/2011    3:13 PM, 12/08/18 Etta Grandchild, PT, DPT Physical Therapist - [facility]  Outpatient Physical [place] [phone]     Etta Grandchild 12/08/2018, 3:13 PM  [place] MAIN [facility] SERVICES [address] [place], [place], [address] Phone: [phone]   Fax:  [phone]  Name: Carly Marks MRN: [id number] Date of Birth: 01-01-1956

## 2018-12-13 ENCOUNTER — Ambulatory Visit: Payer: Medicare Other | Attending: Internal Medicine

## 2018-12-13 ENCOUNTER — Ambulatory Visit: Payer: Medicare Other | Admitting: Occupational Therapy

## 2018-12-13 DIAGNOSIS — R2689 Other abnormalities of gait and mobility: Secondary | ICD-10-CM | POA: Diagnosis present

## 2018-12-13 DIAGNOSIS — M6281 Muscle weakness (generalized): Secondary | ICD-10-CM | POA: Diagnosis present

## 2018-12-13 DIAGNOSIS — R278 Other lack of coordination: Secondary | ICD-10-CM | POA: Diagnosis present

## 2018-12-13 DIAGNOSIS — R262 Difficulty in walking, not elsewhere classified: Secondary | ICD-10-CM

## 2018-12-13 NOTE — Therapy (Signed)
[place] MAIN [facility] SERVICES [address] [place], [place], [address] Phone: [phone]   Fax:  [phone]  Physical Therapy Treatment  Patient Details  Name: Carly Marks MRN: [id number] Date of Birth: 23-Mar-1956 Referring Provider (PT): BURNS, HARRIETT P   Encounter Date: 12/13/2018  PT End of Session - 12/13/18 0936    Visit Number  9    Number of Visits  17    Date for PT Re-Evaluation  12/19/18    Authorization Type  5/10  ( eval date 10/24/18)    PT Start Time  0922    PT Stop Time  1002    PT Time Calculation (min)  40 min    Activity Tolerance  Patient tolerated treatment well;Patient limited by fatigue    Behavior During Therapy  [facility] for tasks assessed/performed;Flat affect       Past Medical History:  Diagnosis Date  . Anemia   . Anxiety   . Arthritis   . Asthma   . CAD (coronary artery disease)    per patient  . Depression   . Family history of colonic polyps   . GERD (gastroesophageal reflux disease)   . HA (headache)   . HTN (hypertension)   . Major depressive disorder, recurrent episode, moderate ([place])   . Obstructive sleep apnea     Past Surgical History:  Procedure Laterality Date  . CARDIAC CATHETERIZATION  02/19/15, 06/2009, 08/2011  . COLONOSCOPY N/A 04/22/2015   Procedure: COLONOSCOPY;  Surgeon: Hulen Luster, MD;  Location: [facility] ENDOSCOPY;  Service: Gastroenterology;  Laterality: N/A;  . TUBAL LIGATION      There were no vitals filed for this visit.  Subjective Assessment - 12/13/18 0935    Subjective  Pt doing well today, reports a nice weekend. She says she felt tired after last session. Son reports no new updates. Pt denies any recen tissues with falls or balance.     Pertinent History  Patient was inhospital Aug 16th for one night and then she went home. She does not get up to walk, can not open bottles.  She was up cleaning the house and would go out and run errands. Now she is not getting out of  bed and spends most of the day in bed. This has been going on for 6 months. She had a seizure August 16th.        Intervention this Date:  -Walking intervals: 3x162ft (farthest distance over past 3 sessions), slower this date @ 0.53m/s v 0.39m/s last session. Third repeat performed at 0.62m/s after extensive cues to 'walk as fast as you can' and 'take bigger steps'. -STS from chair+airex: 2x20 -Seated Marching: 2x20 (height declines with fatigue, VC required to attend to quality of movement)  -Standing Heel Raises: 2x15 BUE support (height is very limited) second set requires extensive cues after pt attempts to perfrom to taps on the 4" steps that is standing on side and out of the way.  -Forward step-up, 4" step: preferential Rt foot 10x, then cued for Left, which requires tactile cuing with 50% error over 8 reps.    *adequate recovery time provided. Pt provides minimal feedback on fatigue, but shows decline in AMB motor control and will report fatigue in legs. Pt able to keep track of counting for simple exercises when given the opportunity, but with more complex activity, often will perform too many, require VC        PT Short Term Goals - 10/24/18  [place] #1   Title  Patient will be independent in home exercise program to improve strength/mobility for better functional independence with ADLs.    Time  4    Period  Weeks    Status  New    Target Date  11/21/18        PT Long Term Goals - 10/24/18 1013      PT LONG TERM GOAL #1   Title  Patient (> 71 years old) will complete five times sit to stand test in < 15 seconds indicating an increased LE strength and improved balance    Time  8    Period  Weeks    Status  New    Target Date  12/19/18      PT LONG TERM GOAL #2   Title  Patient will increase 10 meter walk test to >1.39m/s as to improve gait speed for better community ambulation and to reduce fall risk.    Time  8    Period  Weeks    Status  New     Target Date  12/19/18      PT LONG TERM GOAL #3   Title  Patient will reduce timed up and go to <11 seconds to reduce fall risk and demonstrate improved transfer/gait ability.    Time  8    Period  Weeks    Status  New    Target Date  12/19/18            Plan - 12/13/18 0936    Clinical Impression Statement  Pt presenting feeling more tired this date, easy to see in slower gait speed. Pt is flat affect with delayed response as her baseline. Pt with more difficulty this date obtaining a step-through gait as she was able to so easily last session, likely also related to fatigued state. Progressed interval distance for AMB, but limited repeats to 3x rather than 5. Pt reponds well to verbal cues for increasing step lenght and gait speed, but appears to requires some time to wram up over several minutes. HR remains slow after wlakign intervals ~62BPM average. Cognition appears to decline toward end of session, with increased flat affect, less frequent response to cues, and more difficulty following commands. This is consistent with prior sessions and should be monitored more closely in future as it could potentially decrease patient safety.     Rehab Potential  Fair    PT Frequency  2x / week    PT Duration  8 weeks    PT Treatment/Interventions  Manual techniques;Neuromuscular re-education;Patient/family education;Balance training;Therapeutic activities;Therapeutic exercise;Gait training    PT Next Visit Plan  Continue to progress AMB tolerance c SPC. Progress strength and dynamic balance as able.     PT Home Exercise Plan  BRIDGES, Hooklying marching, standing hip abd, hip ext    Consulted and Agree with Plan of Care  Patient       Patient will benefit from skilled therapeutic intervention in order to improve the following deficits and impairments:  Abnormal gait, Decreased balance, Decreased endurance, Difficulty walking, Decreased mobility, Obesity, Pain, Decreased strength, Decreased  safety awareness, Decreased activity tolerance  Visit Diagnosis: Muscle weakness (generalized)  Other lack of coordination  Difficulty in walking, not elsewhere classified  Other abnormalities of gait and mobility     Problem List Patient Active Problem List   Diagnosis Date Noted  . Stroke (cerebrum) ([place]) 06/25/2018  . CVA (cerebral  vascular accident) ([place]) 06/24/2018  . Anxiety 10/08/2015  . Symptomatic bradycardia 10/08/2015  . CAD (coronary artery disease) 04/16/2015  . Chronic constipation 04/16/2015  . HTN (hypertension) 04/16/2015  . Arthritis, degenerative 04/16/2015  . Allergic rhinitis, seasonal 04/16/2015  . Avitaminosis D 04/16/2015  . Depression, major, recurrent, moderate ([place]) 04/15/2015  . Atypical chest pain 06/26/2013  . Brash 06/26/2013  . Arthritis 06/26/2013  . Asthma, intermittent 01/17/2013  . Airway hyperreactivity 07/20/2011  . GERD (gastroesophageal reflux disease) 07/20/2011  . HCD (hypertensive cardiovascular disease) 07/20/2011   10:00 AM, 12/13/18 Etta Grandchild, PT, DPT Physical Therapist - [facility]  Outpatient Physical Therapy- [place] 12/13/2018, 9:50 AM  [place] MAIN [facility] SERVICES [address] [place], [place], [address] Phone: [phone]   Fax:  [phone]  Name: Carly Marks MRN: [id number] Date of Birth: [birth year]

## 2018-12-13 NOTE — Therapy (Signed)
[place] MAIN [facility] SERVICES [address] [place], [place], [address] Phone: [phone]   Fax:  [phone]  Patient Details  Name: LYNNEX FULP MRN: [id number] Date of Birth: 02-14-1956 Referring Provider:  Ellamae Sia, MD  Encounter Date: 12/13/2018   Pt. was present for PT this a.m. They left following the session this morning with her son unaware that she was scheduled for an OT treatment visit.   Harrel Carina, MS, OTR/L 12/13/2018, 10:27 AM  [place] MAIN [facility] SERVICES [address] [place], [place], [address] Phone: [phone]   Fax:  [phone]

## 2018-12-15 ENCOUNTER — Ambulatory Visit: Payer: Medicare Other

## 2018-12-19 ENCOUNTER — Encounter: Payer: Medicare Other | Admitting: Occupational Therapy

## 2018-12-20 ENCOUNTER — Ambulatory Visit: Payer: Medicare Other | Admitting: Occupational Therapy

## 2018-12-20 ENCOUNTER — Encounter: Payer: Self-pay | Admitting: Occupational Therapy

## 2018-12-20 ENCOUNTER — Ambulatory Visit: Payer: Medicare Other

## 2018-12-20 DIAGNOSIS — R278 Other lack of coordination: Secondary | ICD-10-CM

## 2018-12-20 DIAGNOSIS — R262 Difficulty in walking, not elsewhere classified: Secondary | ICD-10-CM

## 2018-12-20 DIAGNOSIS — R2689 Other abnormalities of gait and mobility: Secondary | ICD-10-CM

## 2018-12-20 DIAGNOSIS — M6281 Muscle weakness (generalized): Secondary | ICD-10-CM | POA: Diagnosis not present

## 2018-12-20 NOTE — Therapy (Signed)
Progress Note Reporting Period 10/24/18 to 12/20/18  See note below for Objective Data and Assessment of Progress/Goals.           [place] MAIN [facility] SERVICES [address] [place], [place], [address] Phone: [phone]   Fax:  [phone]  Physical Therapy Treatment/Discharge  Patient Details  Name: Carly Marks MRN: [id number] Date of Birth: 1956/11/02 Referring Provider (PT): BURNS, HARRIETT P   Encounter Date: 12/20/2018  PT End of Session - 12/20/18 1028    Visit Number  10    Number of Visits  17    Date for PT Re-Evaluation  12/19/18    Authorization Type  10/10  (eval date 10/24/18)    PT Start Time  1020    PT Stop Time  1050    PT Time Calculation (min)  30 min    Activity Tolerance  Patient tolerated treatment well;Patient limited by fatigue    Behavior During Therapy  [facility] for tasks assessed/performed;Flat affect       Past Medical History:  Diagnosis Date  . Anemia   . Anxiety   . Arthritis   . Asthma   . CAD (coronary artery disease)    per patient  . Depression   . Family history of colonic polyps   . GERD (gastroesophageal reflux disease)   . HA (headache)   . HTN (hypertension)   . Major depressive disorder, recurrent episode, moderate ([place])   . Obstructive sleep apnea     Past Surgical History:  Procedure Laterality Date  . CARDIAC CATHETERIZATION  02/19/15, 06/2009, 08/2011  . COLONOSCOPY N/A 04/22/2015   Procedure: COLONOSCOPY;  Surgeon: Hulen Luster, MD;  Location: [facility] ENDOSCOPY;  Service: Gastroenterology;  Laterality: N/A;  . TUBAL LIGATION      There were no vitals filed for this visit.  Subjective Assessment - 12/20/18 1027    Subjective  Pt doing well today. She offers little in updates. She reports having a nice weekend, talking with 'lots of family.'    Pertinent History  Patient was inhospital Aug 16th for one night and then she went home. She does not get up to walk, can not open  bottles.  She was up cleaning the house and would go out and run errands. Now she is not getting out of bed and spends most of the day in bed. This has been going on for 6 months. She had a seizure August 16th.         Intervention this Date:  -Walking intervals: 4x158f (verbal cues for big steps and maximal distance) SPC, supervision level assistance  -STS from chair+airex: 3x20 -Standing Heel Raises: 2x15 BUE support (height is very limited)          PT Short Term Goals - 12/20/18 1029      PT SHORT TERM GOAL #1   Title  Patient will be independent in home exercise program to improve strength/mobility for better functional independence with ADLs.    Baseline  Cognitive deficit prevent reliable report, different family  members bring patient in each date, some not directly involved with home care.     Time  4    Period  Weeks    Status  On-going    Target Date  11/21/18        PT Long Term Goals - 12/20/18 1030      PT LONG TERM GOAL #1   Title  Patient (> 659years old) will complete  five times sit to stand test in <15 seconds indicating an increased LE strength and improved balance    Baseline  5xSTS performed in 13.5sec [facility] for balance)     Time  8    Period  Weeks    Status  Achieved      PT LONG TERM GOAL #2   Title  Patient will increase 10 meter walk test to >1.65ms as to improve gait speed for better community ambulation and to reduce fall risk.    Baseline  0.489m consistent with prior 4 sessions, do not anticipate significant improvement in speed at this time 2/2 cognitive status.     Time  8    Period  Weeks    Status  Partially Met    Target Date  12/19/18      PT LONG TERM GOAL #3   Title  Patient will reduce timed up and go to <11 seconds to reduce fall risk and demonstrate improved transfer/gait ability.    Baseline  On 2/11 performed in 15.3sec     Time  8    Period  Weeks    Status  Not Met    Target Date  12/19/18            Plan -  12/20/18 1040    Clinical Impression Statement  Reassessmen tdone this date. Pt has made good progress toward all goals, achieved half of them. Improvements in gross motor measures have been largely at plateau secondary to cognitive limtations, and patient has struggled with cognitively participating through an entire session for several weeks. Discussed results of reassessment with Son and he gave feedback for better framing of current functional status. Concensus agreement that patient would more greatly benefit from daily AMB at this time, and gave detail to Son on structured supervised walking with patient. Pt is now ready for DC fromPT services.     Clinical Presentation  Stable    Clinical Presentation due to:  objective tests and measures    Clinical Decision Making  Moderate    Rehab Potential  Fair    PT Frequency  2x / week    PT Duration  8 weeks    PT Treatment/Interventions  Manual techniques;Neuromuscular re-education;Patient/family education;Balance training;Therapeutic activities;Therapeutic exercise;Gait training    PT Next Visit Plan  Pt now discharged     Consulted and Agree with Plan of Care  Patient;Family member/caregiver    Family Member Consulted  Son DeVicente Males      Patient will benefit from skilled therapeutic intervention in order to improve the following deficits and impairments:  Abnormal gait, Decreased balance, Decreased endurance, Difficulty walking, Decreased mobility, Obesity, Pain, Decreased strength, Decreased safety awareness, Decreased activity tolerance  Visit Diagnosis: Muscle weakness (generalized)  Other lack of coordination  Difficulty in walking, not elsewhere classified  Other abnormalities of gait and mobility     Problem List Patient Active Problem List   Diagnosis Date Noted  . St[place]rebrum) ([place]/17/[place]  . CVA (cerebral vascular accident) ([facility] [place]08/16/2019  . Anxiety 10/08/2015  . Symptomatic bradycardia 10/08/2015  . CAD  (coronary artery disease) 04/16/2015  . Chronic constipation 04/16/2015  . HTN (hypertension) 04/16/2015  . Arthritis, degenerative 04/16/2015  . Allergic rhinitis, seasonal 04/16/2015  . Avitaminosis D 04/16/2015  . Depression, major, recurrent, moderate ([facility]06/04/2015  . Atypical chest pain 06/26/2013  . Brash 06/26/2013  . Arthritis 06/26/2013  . Asthma, intermittent 01/17/2013  . Airway hyperreactivity 07/20/2011  . GERD (gastroesophageal  reflux disease) 07/20/2011  . HCD (hypertensive cardiovascular disease) 07/20/2011   12:34 PM, 12/20/18 Etta Grandchild, PT, DPT Physical Therapist - [place] [phone]     Etta Grandchild 12/20/2018, 12:29 PM  [place] MAIN [facility] SERVICES [address] [place], [place], [address] Phone: [phone]   Fax:  [phone]  Name: Carly Marks MRN: [id number] Date of Birth: 01-Jan-1956

## 2018-12-20 NOTE — Therapy (Signed)
[place] MAIN [facility] SERVICES [address] [place], [place], [address] Phone: [phone]   Fax:  [phone]  Occupational Therapy Treatment  Patient Details  Name: Carly Marks MRN: [id number] Date of Birth: 1956-05-01 Referring Provider (OT): Quay Burow, [place]   Encounter Date: 12/20/2018  OT End of Session - 12/21/18 2224    Visit Number  4    Number of Visits  24    Date for OT Re-Evaluation  02/14/19    Authorization Type  Progress reports period beginning 11/22/2018.    OT Start Time  [phone]    OT Stop Time  1015    OT Time Calculation (min)  42 min    Activity Tolerance  Patient tolerated treatment well;Patient limited by fatigue    Behavior During Therapy  [facility] for tasks assessed/performed;Flat affect       Past Medical History:  Diagnosis Date  . Anemia   . Anxiety   . Arthritis   . Asthma   . CAD (coronary artery disease)    per patient  . Depression   . Family history of colonic polyps   . GERD (gastroesophageal reflux disease)   . HA (headache)   . HTN (hypertension)   . Major depressive disorder, recurrent episode, moderate ([place])   . Obstructive sleep apnea     Past Surgical History:  Procedure Laterality Date  . CARDIAC CATHETERIZATION  02/19/15, 06/2009, 08/2011  . COLONOSCOPY N/A 04/22/2015   Procedure: COLONOSCOPY;  Surgeon: Hulen Luster, MD;  Location: [facility] ENDOSCOPY;  Service: Gastroenterology;  Laterality: N/A;  . TUBAL LIGATION      There were no vitals filed for this visit.  Subjective Assessment - 12/21/18 2223    Subjective   Patient reports her son brought her today, "its raining outside"      Patient is accompained by:  Family member    Pertinent History  Pt. is a 63 y.o. female who was diagnosed with a CVA on 06/24/2018. Pt. has been receiving outpatient PT services, and is now appropriate for outpatient OT services.  Pt. has a history of Seizures. Pt.'s daughter reports that she has been diagnosed with  early dementia recently, and is scheduled to be evaluated by specialists in [place] in March of 2020. Pt. has supportive children, and resides with her daughter who assists the patient with ADLs, and IADLs.  Pt. is waiting for home care aides through the [employer] program.    Currently in Pain?  No/denies    Pain Score  0-No pain      Therex:  Patient seen for hand strengthening with green resistive putty for gross grasp, rolling out and performing 2 point and 3 point pinch skills.   Patient using bilateral UEs to form medium balls of putty "meatballs"  Patient requires moderate cues and therapist demo for proper form and technique.    Neuromuscular Reeducation: Patient seen for manipulation of small snap beads, Patient unable to snap small beads together,  lacks finger strength, able to unsnap a few of the items from a string and place into container, cues for prehension patterns.       Patient slow to complete tasks, delayed responses to questions during session. Son Montine Circle brought her to therapy today.              OT Education - 12/21/18 2223    Education Details  putty exercises    Person(s) Educated  Patient    Methods  Explanation;Demonstration;Verbal  cues    Comprehension  Verbalized understanding;Returned demonstration;Verbal cues required       OT Short Term Goals - 11/22/18 1741      OT SHORT TERM GOAL #1   Title  --    Baseline  --        OT Long Term Goals - 11/22/18 1742      OT LONG TERM GOAL #1   Title  Pt. will increase UE strength by 2 mm grades to assist with ADLs, and IADLs    Baseline  Eval:  limited UE strength    Time  12    Period  Weeks    Status  New    Target Date  02/14/19      OT LONG TERM GOAL #2   Title  Pt. will perform self-feeding skills with modified independence    Baseline  Eval: Pt. hand difficulty secondary to tremors.    Time  12    Period  Weeks    Status  New    Target Date  02/14/19      OT LONG TERM GOAL #3    Title  Pt. will improve right grip strength to be able to open a drink    Baseline  Eval: Pt. has difficulty    Time  12    Period  Weeks    Status  New    Target Date  02/14/19      OT LONG TERM GOAL #4   Title  Pt. will be independent with making a sandwich    Baseline  Eval: Pt. is unable    Time  12    Period  Weeks    Status  New    Target Date  02/14/19      OT LONG TERM GOAL #5   Title  Pt. will improve [facility] skills to be able to open packets, and packages.    Baseline  Eval: Pt. is unable     Time  12    Status  New    Target Date  02/14/19            Plan - 12/21/18 2224    Clinical Impression Statement  Patient presents with delayed processing of information and response to questions and directions for task.  Patient demonstrated difficulty with ability to snap small beads together, limited coordination and strength in digits.  Continue to work towards goals to increase independence in daily tasks.      Occupational Profile and client history currently impacting functional performance  Pt. has adult children, enjoyed going shopping, cooking/baking. Pt.'s daughter reports that her mother "did not like to lay around" and was her motivator.     Occupational performance deficits (Please refer to evaluation for details):  ADL's    Rehab Potential  Good    Current Impairments/barriers affecting progress:  Motivation, cognition, multiple comorbidities    OT Frequency  2x / week    OT Duration  12 weeks    OT Treatment/Interventions  Self-care/ADL training;DME and/or AE instruction;Therapeutic exercise;Therapeutic activities;Patient/family education;Neuromuscular education    Consulted and Agree with Plan of Care  Family member/caregiver;Patient       Patient will benefit from skilled therapeutic intervention in order to improve the following deficits and impairments:  Decreased cognition, Decreased strength, Impaired UE functional use, Pain, Decreased knowledge of use of  DME, Decreased coordination, Decreased endurance, Decreased activity tolerance, Decreased balance  Visit Diagnosis: Muscle weakness (generalized)  Other lack of coordination  Problem List Patient Active Problem List   Diagnosis Date Noted  . Stroke (cerebrum) ([place]) 06/25/2018  . CVA (cerebral vascular accident) ([place]) 06/24/2018  . Anxiety 10/08/2015  . Symptomatic bradycardia 10/08/2015  . CAD (coronary artery disease) 04/16/2015  . Chronic constipation 04/16/2015  . HTN (hypertension) 04/16/2015  . Arthritis, degenerative 04/16/2015  . Allergic rhinitis, seasonal 04/16/2015  . Avitaminosis D 04/16/2015  . Depression, major, recurrent, moderate ([place]) 04/15/2015  . Atypical chest pain 06/26/2013  . Brash 06/26/2013  . Arthritis 06/26/2013  . Asthma, intermittent 01/17/2013  . Airway hyperreactivity 07/20/2011  . GERD (gastroesophageal reflux disease) 07/20/2011  . HCD (hypertensive cardiovascular disease) 07/20/2011    T Tomasita Morrow, OTR/L, CLT  , 12/21/2018, 10:31 PM  [place] MAIN [facility] SERVICES [address] [place], [place], [address] Phone: [phone]   Fax:  [phone]  Name: Carly Marks MRN: [id number] Date of Birth: 1956-07-05

## 2018-12-22 ENCOUNTER — Encounter: Payer: Self-pay | Admitting: Occupational Therapy

## 2018-12-22 ENCOUNTER — Ambulatory Visit: Payer: Medicare Other | Admitting: Physical Therapy

## 2018-12-22 ENCOUNTER — Ambulatory Visit: Payer: Medicare Other | Admitting: Occupational Therapy

## 2018-12-22 DIAGNOSIS — R278 Other lack of coordination: Secondary | ICD-10-CM

## 2018-12-22 DIAGNOSIS — M6281 Muscle weakness (generalized): Secondary | ICD-10-CM | POA: Diagnosis not present

## 2018-12-22 NOTE — Therapy (Signed)
[place] MAIN [facility] SERVICES [address] [place], [place], [address] Phone: [phone]   Fax:  [phone]  Occupational Therapy Treatment  Patient Details  Name: NITASHA JEWEL MRN: [id number] Date of Birth: 1956/09/02 Referring Provider (OT): Quay Burow, [place]   Encounter Date: 12/22/2018  OT End of Session - 12/22/18 1114    Visit Number  5    Number of Visits  24    Date for OT Re-Evaluation  02/14/19    Authorization Type  Progress reports period beginning 11/22/2018.    OT Start Time  1106    OT Stop Time  1145    OT Time Calculation (min)  39 min    Activity Tolerance  Patient tolerated treatment well;Patient limited by fatigue    Behavior During Therapy  [facility] for tasks assessed/performed;Flat affect       Past Medical History:  Diagnosis Date  . Anemia   . Anxiety   . Arthritis   . Asthma   . CAD (coronary artery disease)    per patient  . Depression   . Family history of colonic polyps   . GERD (gastroesophageal reflux disease)   . HA (headache)   . HTN (hypertension)   . Major depressive disorder, recurrent episode, moderate ([place])   . Obstructive sleep apnea     Past Surgical History:  Procedure Laterality Date  . CARDIAC CATHETERIZATION  02/19/15, 06/2009, 08/2011  . COLONOSCOPY N/A 04/22/2015   Procedure: COLONOSCOPY;  Surgeon: Hulen Luster, MD;  Location: [facility] ENDOSCOPY;  Service: Gastroenterology;  Laterality: N/A;  . TUBAL LIGATION      There were no vitals filed for this visit.  Subjective Assessment - 12/22/18 1112    Subjective   Pt. reports she is doing well today.    Patient is accompained by:  Family member    Pertinent History  Pt. is a 63 y.o. female who was diagnosed with a CVA on 06/24/2018. Pt. has been receiving outpatient PT services, and is now appropriate for outpatient OT services.  Pt. has a history of Seizures. Pt.'s daughter reports that she has been diagnosed with early dementia recently, and  is scheduled to be evaluated by specialists in [place] in March of 2020. Pt. has supportive children, and resides with her daughter who assists the patient with ADLs, and IADLs.  Pt. is waiting for home care aides through the [employer] program.    Currently in Pain?  No/denies       OT Treatment  Therapeutic Exercise:  Pt. Performed hand strengthening with green theraputty. Pt. required cues for proper technique. Pt. worked on gross grip, lateral pinch, thumb abduction, digit extension with rolling at the tabletop, and formulating circular spheres. Pt. required consistent verbal, tactile cues, and visual demonstration for proper technique, and occasional cues to open her eyes. Pt. worked on lateral pinch, and attempted 2pt. Pinch for yellow,resistive clips. Cues were required for hand posiiton.                       OT Education - 12/22/18 1113    Education Details  putty exercises    Person(s) Educated  Patient    Methods  Explanation;Demonstration;Verbal cues    Comprehension  Verbalized understanding;Returned demonstration;Verbal cues required       OT Short Term Goals - 11/22/18 1741      OT SHORT TERM GOAL #1   Title  --    Baseline  --  OT Long Term Goals - 11/22/18 1742      OT LONG TERM GOAL #1   Title  Pt. will increase UE strength by 2 mm grades to assist with ADLs, and IADLs    Baseline  Eval:  limited UE strength    Time  12    Period  Weeks    Status  New    Target Date  02/14/19      OT LONG TERM GOAL #2   Title  Pt. will perform self-feeding skills with modified independence    Baseline  Eval: Pt. hand difficulty secondary to tremors.    Time  12    Period  Weeks    Status  New    Target Date  02/14/19      OT LONG TERM GOAL #3   Title  Pt. will improve right grip strength to be able to open a drink    Baseline  Eval: Pt. has difficulty    Time  12    Period  Weeks    Status  New    Target Date  02/14/19      OT LONG TERM  GOAL #4   Title  Pt. will be independent with making a sandwich    Baseline  Eval: Pt. is unable    Time  12    Period  Weeks    Status  New    Target Date  02/14/19      OT LONG TERM GOAL #5   Title  Pt. will improve [facility] skills to be able to open packets, and packages.    Baseline  Eval: Pt. is unable     Time  12    Status  New    Target Date  02/14/19            Plan - 12/22/18 1116    Clinical Impression Statement  Pt.  continues to present with delayed processing of information requirng verbal cues, and visual demonstration for each task. Pt. continues to work on improving [employer], and [facility] skills in order to be able to to be able to self herself, open packets, and containers, and open a drink.    Occupational Profile and client history currently impacting functional performance  Pt. has adult children, enjoyed going shopping, cooking/baking. Pt.'s daughter reports that her mother "did not like to lay around" and was her motivator.     Occupational performance deficits (Please refer to evaluation for details):  ADL's    Rehab Potential  Good    Current Impairments/barriers affecting progress:  Motivation, cognition, multiple comorbidities    OT Frequency  2x / week    OT Duration  12 weeks    OT Treatment/Interventions  Self-care/ADL training;DME and/or AE instruction;Therapeutic exercise;Therapeutic activities;Patient/family education;Neuromuscular education    Clinical Decision Making  Several treatment options, min-mod task modification necessary    Consulted and Agree with Plan of Care  Family member/caregiver;Patient    Family Member Consulted  Daughter       Patient will benefit from skilled therapeutic intervention in order to improve the following deficits and impairments:  Decreased cognition, Decreased strength, Impaired UE functional use, Pain, Decreased knowledge of use of DME, Decreased coordination, Decreased endurance, Decreased activity tolerance,  Decreased balance  Visit Diagnosis: Muscle weakness (generalized)  Other lack of coordination    Problem List Patient Active Problem List   Diagnosis Date Noted  . Stroke (cerebrum) ([place]) 06/25/2018  . CVA (cerebral vascular accident) ([place])  06/24/2018  . Anxiety 10/08/2015  . Symptomatic bradycardia 10/08/2015  . CAD (coronary artery disease) 04/16/2015  . Chronic constipation 04/16/2015  . HTN (hypertension) 04/16/2015  . Arthritis, degenerative 04/16/2015  . Allergic rhinitis, seasonal 04/16/2015  . Avitaminosis D 04/16/2015  . Depression, major, recurrent, moderate ([place]) 04/15/2015  . Atypical chest pain 06/26/2013  . Brash 06/26/2013  . Arthritis 06/26/2013  . Asthma, intermittent 01/17/2013  . Airway hyperreactivity 07/20/2011  . GERD (gastroesophageal reflux disease) 07/20/2011  . HCD (hypertensive cardiovascular disease) 07/20/2011    Harrel Carina, MS, OTR/L 12/22/2018, 3:36 PM  [place] MAIN [facility] SERVICES [address] [place], [place], [address] Phone: [phone]   Fax:  [phone]  Name: WALESKA BUTTERY MRN: [id number] Date of Birth: 1956-05-23

## 2018-12-27 ENCOUNTER — Ambulatory Visit: Payer: Medicare Other | Admitting: Physical Therapy

## 2018-12-29 ENCOUNTER — Ambulatory Visit: Payer: Medicare Other | Admitting: Occupational Therapy

## 2018-12-29 ENCOUNTER — Ambulatory Visit: Payer: Medicare Other

## 2018-12-29 ENCOUNTER — Encounter: Payer: Self-pay | Admitting: Occupational Therapy

## 2018-12-29 DIAGNOSIS — M6281 Muscle weakness (generalized): Secondary | ICD-10-CM

## 2018-12-29 DIAGNOSIS — R278 Other lack of coordination: Secondary | ICD-10-CM

## 2018-12-29 NOTE — Therapy (Signed)
[place] MAIN [facility] SERVICES [address] [place], [place], [address] Phone: [phone]   Fax:  [phone]  Occupational Therapy Treatment  Patient Details  Name: Carly Marks MRN: [id number] Date of Birth: 09-30-1956 Referring Provider (OT): Quay Burow, [place]   Encounter Date: 12/29/2018  OT End of Session - 12/29/18 1040    Visit Number  6    Number of Visits  24    Date for OT Re-Evaluation  02/14/19    Authorization Type  Progress reports period beginning 11/22/2018.    OT Start Time  1033    OT Stop Time  1115    OT Time Calculation (min)  42 min    Activity Tolerance  Patient tolerated treatment well;Patient limited by fatigue    Behavior During Therapy  [facility] for tasks assessed/performed;Flat affect       Past Medical History:  Diagnosis Date  . Anemia   . Anxiety   . Arthritis   . Asthma   . CAD (coronary artery disease)    per patient  . Depression   . Family history of colonic polyps   . GERD (gastroesophageal reflux disease)   . HA (headache)   . HTN (hypertension)   . Major depressive disorder, recurrent episode, moderate ([place])   . Obstructive sleep apnea     Past Surgical History:  Procedure Laterality Date  . CARDIAC CATHETERIZATION  02/19/15, 06/2009, 08/2011  . COLONOSCOPY N/A 04/22/2015   Procedure: COLONOSCOPY;  Surgeon: Hulen Luster, MD;  Location: [facility] ENDOSCOPY;  Service: Gastroenterology;  Laterality: N/A;  . TUBAL LIGATION      There were no vitals filed for this visit.  Subjective Assessment - 12/29/18 1039    Subjective   Denies any pain this date.  Reports it has not started raining or snowing outside.     Pertinent History  Pt. is a 63 y.o. female who was diagnosed with a CVA on 06/24/2018. Pt. has been receiving outpatient PT services, and is now appropriate for outpatient OT services.  Pt. has a history of Seizures. Pt.'s daughter reports that she has been diagnosed with early dementia recently, and  is scheduled to be evaluated by specialists in [place] in March of 2020. Pt. has supportive children, and resides with her daughter who assists the patient with ADLs, and IADLs.  Pt. is waiting for home care aides through the [facility] program.    Patient Stated Goals  to do as much as she can    Currently in Pain?  No/denies    Pain Score  0-No pain        Treatment: Therapeutic Exercise: Patient seen for focus on ROM and strengthening of bilateral UEs, attempted two pound dowel exercise but was unable to complete, downgraded to 1# dowel for  Shoulder flexion, ABD/ADD, chest press, elbow flexion/extension, patient required extensive cues and therapist guiding and assist with dowel to complete task.   Resistive velcro squares for finger strengthening, cues for prehension and pinch skills to place and remove, board placed in vertical plane to encourage greater reaching.    Patient performing ROM and reaching with use of 4 level Shape tower.  Patient Able to perform all levels with effort to the last level and cues for weight shifting and reaching tasks.        ADL:  Patient seen for handwriting skills with focus on printing name with emphasis on size of letters and legibility.  Patient demonstrates micrographia and fair  to poor legibility.  The more she writes, legibility decreases.                OT Education - 12/29/18 1619    Education Details  ROM, reaching tasks, handwriting    Person(s) Educated  Patient    Methods  Explanation;Demonstration;Verbal cues    Comprehension  Verbalized understanding;Returned demonstration;Verbal cues required       OT Short Term Goals - 11/22/18 1741      OT SHORT TERM GOAL #1   Title  --    Baseline  --        OT Long Term Goals - 12/29/18 1631      OT LONG TERM GOAL #1   Title  Pt. will increase UE strength by 2 mm grades to assist with ADLs, and IADLs  (Pended)     Baseline  Eval:  limited UE strength  (Pended)     Time  12   (Pended)     Period  Weeks  (Pended)     Status  On-going  (Pended)     Target Date  02/11/19  (Pended)       OT LONG TERM GOAL #2   Title  Pt. will perform self-feeding skills with modified independence  (Pended)     Baseline  Eval: Pt. hand difficulty secondary to tremors.  (Pended)     Time  12  (Pended)     Period  Weeks  (Pended)     Status  On-going  (Pended)     Target Date  02/14/19  (Pended)       OT LONG TERM GOAL #3   Title  Pt. will improve right grip strength to be able to open a drink  (Pended)     Baseline  Eval: Pt. has difficulty  (Pended)     Time  12  (Pended)     Period  Weeks  (Pended)     Status  On-going  (Pended)     Target Date  02/14/19  (Pended)       OT LONG TERM GOAL #4   Title  Pt. will be independent with making a sandwich  (Pended)     Baseline  Eval: Pt. is unable  (Pended)     Time  12  (Pended)     Period  Weeks  (Pended)     Status  On-going  (Pended)     Target Date  02/14/19  (Pended)       OT LONG TERM GOAL #5   Title  Pt. will improve [facility] skills to be able to open packets, and packages.  (Pended)     Baseline  Eval: Pt. is unable   (Pended)     Time  12  (Pended)     Status  On-going  (Pended)     Target Date  02/14/19  (Pended)             Plan - 12/29/18 1040    Clinical Impression Statement  Pt. requires min to moderate assist with exercises today, therapist guiding through ROM with dowel exercises.  Patient remains slow to respond and demonstrates delayed processsing of information.  She demonstrates difficulty with legibility and size of letters with handwriting.  Continue to work towards goals to increase independence in daily tasks.      Occupational Profile and client history currently impacting functional performance  Pt. has adult children, enjoyed going shopping, cooking/baking. Pt.'s daughter reports that her mother "did  not like to lay around" and was her motivator.     Occupational performance deficits (Please refer  to evaluation for details):  ADL's    Rehab Potential  Good    Current Impairments/barriers affecting progress:  Motivation, cognition, multiple comorbidities    OT Frequency  2x / week    OT Duration  12 weeks    OT Treatment/Interventions  Self-care/ADL training;DME and/or AE instruction;Therapeutic exercise;Therapeutic activities;Patient/family education;Neuromuscular education    Consulted and Agree with Plan of Care  Family member/caregiver;Patient       Patient will benefit from skilled therapeutic intervention in order to improve the following deficits and impairments:  Decreased cognition, Decreased strength, Impaired UE functional use, Pain, Decreased knowledge of use of DME, Decreased coordination, Decreased endurance, Decreased activity tolerance, Decreased balance  Visit Diagnosis: Muscle weakness (generalized)  Other lack of coordination    Problem List Patient Active Problem List   Diagnosis Date Noted  . Stroke (cerebrum) ([place]) 06/25/2018  . CVA (cerebral vascular accident) ([place]) 06/24/2018  . Anxiety 10/08/2015  . Symptomatic bradycardia 10/08/2015  . CAD (coronary artery disease) 04/16/2015  . Chronic constipation 04/16/2015  . HTN (hypertension) 04/16/2015  . Arthritis, degenerative 04/16/2015  . Allergic rhinitis, seasonal 04/16/2015  . Avitaminosis D 04/16/2015  . Depression, major, recurrent, moderate ([place]) 04/15/2015  . Atypical chest pain 06/26/2013  . Brash 06/26/2013  . Arthritis 06/26/2013  . Asthma, intermittent 01/17/2013  . Airway hyperreactivity 07/20/2011  . GERD (gastroesophageal reflux disease) 07/20/2011  . HCD (hypertensive cardiovascular disease) 07/20/2011    T Tomasita Morrow, OTR/L, CLT  , 12/29/2018, 4:38 PM  [place] MAIN [facility] SERVICES [address] [place], [place], [address] Phone: [phone]   Fax:  [phone]  Name: Carly Marks MRN: [id number] Date of Birth: 1956-03-19

## 2019-01-01 ENCOUNTER — Other Ambulatory Visit: Payer: Self-pay

## 2019-01-01 ENCOUNTER — Emergency Department
Admission: EM | Admit: 2019-01-01 | Discharge: 2019-01-01 | Disposition: A | Discharge status: Home / Self Care | Payer: Medicare Other | Attending: Emergency Medicine | Admitting: Emergency Medicine

## 2019-01-01 ENCOUNTER — Emergency Department: Payer: Medicare Other

## 2019-01-01 DIAGNOSIS — Z79899 Other long term (current) drug therapy: Secondary | ICD-10-CM | POA: Diagnosis not present

## 2019-01-01 DIAGNOSIS — J45909 Unspecified asthma, uncomplicated: Secondary | ICD-10-CM | POA: Insufficient documentation

## 2019-01-01 DIAGNOSIS — I10 Essential (primary) hypertension: Secondary | ICD-10-CM | POA: Diagnosis not present

## 2019-01-01 DIAGNOSIS — N3001 Acute cystitis with hematuria: Secondary | ICD-10-CM | POA: Insufficient documentation

## 2019-01-01 DIAGNOSIS — I2510 Atherosclerotic heart disease of native coronary artery without angina pectoris: Secondary | ICD-10-CM | POA: Insufficient documentation

## 2019-01-01 DIAGNOSIS — R319 Hematuria, unspecified: Secondary | ICD-10-CM | POA: Diagnosis present

## 2019-01-01 LAB — CBC
HEMATOCRIT: 38.4 % (ref 36.0–46.0)
HEMOGLOBIN: 12.6 g/dL (ref 12.0–15.0)
MCH: 25.3 pg — ABNORMAL LOW (ref 26.0–34.0)
MCHC: 32.8 g/dL (ref 30.0–36.0)
MCV: 77 fL — ABNORMAL LOW (ref 80.0–100.0)
Platelets: 156 10*3/uL (ref 150–400)
RBC: 4.99 MIL/uL (ref 3.87–5.11)
RDW: 16.2 % — ABNORMAL HIGH (ref 11.5–15.5)
WBC: 8.2 10*3/uL (ref 4.0–10.5)
nRBC: 0 % (ref 0.0–0.2)

## 2019-01-01 LAB — LIPASE, BLOOD: Lipase: 29 U/L (ref 11–51)

## 2019-01-01 LAB — COMPREHENSIVE METABOLIC PANEL
ALT: 28 U/L (ref 0–44)
AST: 52 U/L — AB (ref 15–41)
Albumin: 3.2 g/dL — ABNORMAL LOW (ref 3.5–5.0)
Alkaline Phosphatase: 37 U/L — ABNORMAL LOW (ref 38–126)
Anion gap: 10 (ref 5–15)
BILIRUBIN TOTAL: 0.9 mg/dL (ref 0.3–1.2)
BUN: 13 mg/dL (ref 8–23)
CHLORIDE: 105 mmol/L (ref 98–111)
CO2: 24 mmol/L (ref 22–32)
Calcium: 8.6 mg/dL — ABNORMAL LOW (ref 8.9–10.3)
Creatinine, Ser: 1.25 mg/dL — ABNORMAL HIGH (ref 0.44–1.00)
GFR calc Af Amer: 53 mL/min — ABNORMAL LOW (ref >60)
GFR, EST NON AFRICAN AMERICAN: 46 mL/min — AB (ref >60)
Glucose, Bld: 101 mg/dL — ABNORMAL HIGH (ref 70–99)
POTASSIUM: 4 mmol/L (ref 3.5–5.1)
Sodium: 139 mmol/L (ref 135–145)
TOTAL PROTEIN: 6.1 g/dL — AB (ref 6.5–8.1)

## 2019-01-01 LAB — URINALYSIS, COMPLETE (UACMP) WITH MICROSCOPIC
BACTERIA UA: NONE SEEN
BILIRUBIN URINE: NEGATIVE
GLUCOSE, UA: NEGATIVE mg/dL
KETONES UR: NEGATIVE mg/dL
NITRITE: NEGATIVE
PH: 5 (ref 5.0–8.0)
Protein, ur: 100 mg/dL — AB
RBC / HPF: >50 RBC/hpf — ABNORMAL HIGH (ref 0–5)
Specific Gravity, Urine: 1.017 (ref 1.005–1.030)

## 2019-01-01 MED ORDER — CEFTRIAXONE SODIUM 1 G IJ SOLR: 1.0000 g | Freq: Once | INTRAMUSCULAR | Status: DC

## 2019-01-01 MED ORDER — SODIUM CHLORIDE 0.9% FLUSH: 3.0000 mL | Freq: Once | INTRAVENOUS | Status: DC

## 2019-01-01 MED ORDER — CEPHALEXIN 500 MG PO CAPS: 500.0000 mg | ORAL_CAPSULE | Freq: Three times a day (TID) | ORAL | 0 refills | Status: AC

## 2019-01-01 MED ORDER — CEPHALEXIN 500 MG PO CAPS
500.0000 mg | ORAL_CAPSULE | Freq: Once | ORAL | Status: AC
  Administered 2019-01-01: 500 mg via ORAL
  Filled 2019-01-01: qty 1

## 2019-01-01 NOTE — ED Notes (Signed)
Patient transported to CT 

## 2019-01-01 NOTE — Discharge Instructions (Addendum)

## 2019-01-01 NOTE — ED Provider Notes (Signed)
[facility] Emergency Department Provider Note  ____________________________________________  Time seen: Approximately 3:55 PM  I have reviewed the triage vital signs and the nursing notes.   HISTORY  Chief Complaint Fever and Hematuria   HPI Carly Marks is a 63 y.o. female with history as listed below who presents for evaluation of urinary frequency and hematuria.  According to the daughter patient was up every hour throughout the night to urinate.  She has been complaining of dysuria since yesterday morning.  This morning she felt warm to the touch and daughter noticed blood in the urine.  No nausea or vomiting.  Patient is complaining of mild sharp suprapubic pain and left flank pain that started this morning.  Past Medical History:  Diagnosis Date  . Anemia   . Anxiety   . Arthritis   . Asthma   . CAD (coronary artery disease)    per patient  . Depression   . Family history of colonic polyps   . GERD (gastroesophageal reflux disease)   . HA (headache)   . HTN (hypertension)   . Major depressive disorder, recurrent episode, moderate ([place])   . Obstructive sleep apnea     Patient Active Problem List   Diagnosis Date Noted  . Stroke (cerebrum) ([place]) 06/25/2018  . CVA (cerebral vascular accident) ([place]) 06/24/2018  . Anxiety 10/08/2015  . Symptomatic bradycardia 10/08/2015  . CAD (coronary artery disease) 04/16/2015  . Chronic constipation 04/16/2015  . HTN (hypertension) 04/16/2015  . Arthritis, degenerative 04/16/2015  . Allergic rhinitis, seasonal 04/16/2015  . Avitaminosis D 04/16/2015  . Depression, major, recurrent, moderate ([place]) 04/15/2015  . Atypical chest pain 06/26/2013  . Brash 06/26/2013  . Arthritis 06/26/2013  . Asthma, intermittent 01/17/2013  . Airway hyperreactivity 07/20/2011  . GERD (gastroesophageal reflux disease) 07/20/2011  . HCD (hypertensive cardiovascular disease) 07/20/2011    Past Surgical History:    Procedure Laterality Date  . CARDIAC CATHETERIZATION  02/19/15, 06/2009, 08/2011  . COLONOSCOPY N/A 04/22/2015   Procedure: COLONOSCOPY;  Surgeon: Hulen Luster, MD;  Location: [facility] ENDOSCOPY;  Service: Gastroenterology;  Laterality: N/A;  . TUBAL LIGATION      Prior to Admission medications   Medication Sig Start Date End Date Taking? Authorizing Provider  acetaminophen (TYLENOL) 500 MG tablet Take 1,000 mg by mouth every 4 (four) hours as needed for mild pain or moderate pain.    Yes [provider]  amLODipine-benazepril (LOTREL) 10-20 MG capsule Take 1 capsule by mouth daily.    Yes [provider]  aspirin 81 MG tablet Take 81 mg by mouth daily.   Yes [provider]  atorvastatin (LIPITOR) 40 MG tablet Take 40 mg by mouth daily at 6 PM.  04/01/15  Yes [provider]  cholecalciferol (VITAMIN D) 400 UNITS TABS tablet Take 400 Units by mouth daily.    Yes [provider]  clopidogrel (PLAVIX) 75 MG tablet Take 75 mg by mouth daily.   Yes [provider]  divalproex (DEPAKOTE) 250 MG DR tablet Take 3 tablets (750 mg total) by mouth 2 (two) times daily. 06/25/18  Yes Vaughan Basta, MD  fluticasone (FLONASE) 50 MCG/ACT nasal spray Place 2 sprays into the nose daily.   Yes [provider]  isosorbide dinitrate (ISORDIL) 30 MG tablet Take 30 mg by mouth daily.    Yes [provider]  metoprolol succinate (TOPROL-XL) 25 MG 24 hr tablet Take 1 tablet (25 mg total) by mouth at bedtime. Patient  taking differently: Take 25 mg by mouth daily.  10/10/15  Yes Wieting, Richard, MD  OLANZapine (ZYPREXA) 7.5 MG tablet Take 7.5 mg by mouth at bedtime.    Yes [provider]  omeprazole (PRILOSEC) 20 MG capsule Take 20 mg by mouth daily.   Yes [provider]  cephALEXin (KEFLEX) 500 MG capsule Take 1 capsule (500 mg total) by mouth 3 (three) times daily for 7 days. 01/01/19 01/08/19  Rudene Re, MD  FLUoxetine  (PROZAC) 20 MG capsule Take 3 capsules (60 mg total) by mouth daily. Patient not taking: Reported on 06/24/2018 07/29/15   Marjie Skiff, MD  LORazepam (ATIVAN) 1 MG tablet Take 1 tablet (1 mg total) by mouth 2 (two) times daily. Patient not taking: Reported on 06/24/2018 09/27/15   Marjie Skiff, MD  Melatonin 3 MG TABS Take 1 tablet (3 mg total) by mouth at bedtime. Patient not taking: Reported on 06/24/2018 07/29/15   Marjie Skiff, MD  traZODone (DESYREL) 100 MG tablet Take 1 tablet (100 mg total) by mouth at bedtime as needed for sleep. Take 1 or 1-1/2 tablets at bedtime as needed for insomnia Patient taking differently: Take 50 mg by mouth at bedtime as needed for sleep.  07/29/15   Marjie Skiff, MD    Allergies Codeine; Dextroamphetamine; Nsaids; Morphine and related; and Penicillins  Family History  Problem Relation Age of Onset  . Hypertension Mother   . Diabetes Maternal Aunt   . Diabetes Maternal Uncle   . Hypertension Sister   . Breast cancer Sister 31  . Colon cancer Maternal Grandmother   . Hypertension Brother   . Depression Other   . Colon polyps Other     Social History Social History   Tobacco Use  . Smoking status: Never Smoker  . Smokeless tobacco: Never Used  Substance Use Topics  . Alcohol use: No  . Drug use: No    Review of Systems  Constitutional: Negative for fever. Eyes: Negative for visual changes. ENT: Negative for sore throat. Neck: No neck pain  Cardiovascular: Negative for chest pain. Respiratory: Negative for shortness of breath. Gastrointestinal: Negative for abdominal pain, vomiting or diarrhea. Genitourinary: + dysuria, frequency, L flank pain Musculoskeletal: Negative for back pain. Skin: Negative for rash. Neurological: Negative for headaches, weakness or numbness. Psych: No SI or HI  ____________________________________________   PHYSICAL EXAM:  VITAL SIGNS: ED Triage Vitals  Enc Vitals Group     BP 01/01/19  1313 103/72     Pulse Rate 01/01/19 1313 80     Resp 01/01/19 1313 18     Temp 01/01/19 1313 99.3 F (37.4 C)     Temp Source 01/01/19 1313 Oral     SpO2 01/01/19 1313 99 %     Weight 01/01/19 1311 200 lb (90.7 kg)     Height 01/01/19 1311 5\' 5"  (1.651 m)     Head Circumference --      Peak Flow --      Pain Score 01/01/19 1311 5     Pain Loc --      Pain Edu? --      Excl. in [place]? --     Constitutional: Alert and oriented. Well appearing and in no apparent distress. HEENT:      Head: Normocephalic and atraumatic.         Eyes: Conjunctivae are normal. Sclera is non-icteric.       Mouth/Throat: Mucous membranes are moist.  Neck: Supple with no signs of meningismus. Cardiovascular: Regular rate and rhythm. No murmurs, gallops, or rubs. 2+ symmetrical distal pulses are present in all extremities. No JVD. Respiratory: Normal respiratory effort. Lungs are clear to auscultation bilaterally. No wheezes, crackles, or rhonchi.  Gastrointestinal: Soft, mild suprapubic tenderness, and non distended with positive bowel sounds. No rebound or guarding. Genitourinary: No CVA tenderness. Musculoskeletal: Nontender with normal range of motion in all extremities. No edema, cyanosis, or erythema of extremities. Neurologic: Normal speech and language. Face is symmetric. Moving all extremities. No gross focal neurologic deficits are appreciated. Skin: Skin is warm, dry and intact. No rash noted. Psychiatric: Mood and affect are normal. Speech and behavior are normal.  ____________________________________________   LABS (all labs ordered are listed, but only abnormal results are displayed)  Labs Reviewed  COMPREHENSIVE METABOLIC PANEL - Abnormal; Notable for the following components:      Result Value   Glucose, Bld 101 (*)    Creatinine, Ser 1.25 (*)    Calcium 8.6 (*)    Total Protein 6.1 (*)    Albumin 3.2 (*)    AST 52 (*)    Alkaline Phosphatase 37 (*)    GFR calc non Af Amer 46  (*)    GFR calc Af Amer 53 (*)    All other components within normal limits  CBC - Abnormal; Notable for the following components:   MCV 77.0 (*)    MCH 25.3 (*)    RDW 16.2 (*)    All other components within normal limits  URINALYSIS, COMPLETE (UACMP) WITH MICROSCOPIC - Abnormal; Notable for the following components:   Color, Urine AMBER (*)    APPearance CLOUDY (*)    Hgb urine dipstick LARGE (*)    Protein, ur 100 (*)    Leukocytes,Ua MODERATE (*)    RBC / HPF >50 (*)    WBC, UA >50 (*)    Non Squamous Epithelial PRESENT (*)    All other components within normal limits  URINE CULTURE  LIPASE, BLOOD  LACTIC ACID, PLASMA   ____________________________________________  EKG  none  ____________________________________________  RADIOLOGY  I have personally reviewed the images performed during this visit and I agree with the Radiologist's read.   Interpretation by Radiologist:  Ct Renal Stone Study  Result Date: 01/01/2019 CLINICAL DATA:  Fever and hematuria since last night. EXAM: CT ABDOMEN AND PELVIS WITHOUT CONTRAST TECHNIQUE: Multidetector CT imaging of the abdomen and pelvis was performed following the standard protocol without IV contrast. COMPARISON:  None. FINDINGS: Lower chest: Lung bases clear. Heart size normal. No pleural or pericardial effusion. Hepatobiliary: No focal liver abnormality is seen. No gallstones, gallbladder wall thickening, or biliary dilatation. Pancreas: Unremarkable. No pancreatic ductal dilatation or surrounding inflammatory changes. Spleen: Normal in size without focal abnormality. Adrenals/Urinary Tract: Adrenal glands are unremarkable. Kidneys are normal, without renal calculi, focal lesion, or hydronephrosis. Bladder is unremarkable. Stomach/Bowel: Stomach is within normal limits. Appendix appears normal. No evidence of bowel wall thickening, distention, or inflammatory changes. Vascular/Lymphatic: No significant vascular findings are present. No  enlarged abdominal or pelvic lymph nodes. Reproductive: Small calcified uterine fibroids are seen. No adnexal mass. Other: There is a small volume of free pelvic fluid. Musculoskeletal: No lytic or sclerotic lesion. Lumbar spondylosis noted. IMPRESSION: Negative for urinary tract stone or hydronephrosis. Small volume of free pelvic fluid of indeterminate etiology. Question enteritis. Electronically Signed   By: Inge Rise M.D.   On: 01/01/2019 15:02     ____________________________________________  PROCEDURES  Procedure(s) performed: None Procedures Critical Care performed:  None ____________________________________________   INITIAL IMPRESSION / ASSESSMENT AND PLAN / ED COURSE  63 y.o. female with history as listed below who presents for evaluation of urinary frequency, hematuria, suprapubic abdominal pain and left flank pain since yesterday.  According to the daughter patient felt warm to the touch this morning.  Patient arrives to the emergency department extremely well-appearing, vitals within normal limits with no fever.  UA is positive for UTI.  Due to reported flank pain a CT renal was done to rule out obstructing stone and that is negative.  Labs showing no evidence of sepsis with normal white count.  Urine culture has been sent.  Patient was started on Keflex.  Patient is hemodynamically stable with stable hemoglobin, stable kidney function, and not anticoagulated.  Recommended close follow-up with primary care doctor.  Discussed standard return precautions for signs of sepsis and pyelonephritis and acute blood loss anemia      As part of my medical decision making, I reviewed the following data within the [place] History obtained from family, Nursing notes reviewed and incorporated, Labs reviewed , Old chart reviewed, Radiograph reviewed , Notes from prior ED visits and [place] Controlled Substance Database    Pertinent labs & imaging results that were  available during my care of the patient were reviewed by me and considered in my medical decision making (see chart for details).    ____________________________________________   FINAL CLINICAL IMPRESSION(S) / ED DIAGNOSES  Final diagnoses:  Acute cystitis with hematuria      NEW MEDICATIONS STARTED DURING THIS VISIT:  ED Discharge Orders         Ordered    cephALEXin (KEFLEX) 500 MG capsule  3 times daily     01/01/19 1554           Note:  This document was prepared using Dragon voice recognition software and may include unintentional dictation errors.    Alfred Levins, [place], MD 01/01/19 [phone]

## 2019-01-01 NOTE — ED Triage Notes (Addendum)
Pt comes via POV from home with c/o fever and blood in urine that started last night. Pt also states headache.  Pt states abdominal pain that is burning. Pt also states burning when urinating.  Family reports more shaking than usual as well.

## 2019-01-02 LAB — URINE CULTURE

## 2019-01-03 ENCOUNTER — Ambulatory Visit: Payer: Medicare Other

## 2019-01-05 ENCOUNTER — Ambulatory Visit: Payer: Medicare Other | Admitting: Occupational Therapy

## 2019-01-05 ENCOUNTER — Ambulatory Visit: Payer: Medicare Other

## 2019-01-05 ENCOUNTER — Encounter: Payer: Self-pay | Admitting: Occupational Therapy

## 2019-01-05 DIAGNOSIS — M6281 Muscle weakness (generalized): Secondary | ICD-10-CM | POA: Diagnosis not present

## 2019-01-05 DIAGNOSIS — R278 Other lack of coordination: Secondary | ICD-10-CM

## 2019-01-05 NOTE — Therapy (Signed)
[place] MAIN [facility] SERVICES [address] [place], [place], [address] Phone: [phone]   Fax:  [phone]  Occupational Therapy Treatment  Patient Details  Name: Carly Marks MRN: [id number] Date of Birth: 12-09-1955 Referring Provider (OT): Quay Burow, [place]   Encounter Date: 01/05/2019  OT End of Session - 01/05/19 1306    Visit Number  7    Number of Visits  24    Date for OT Re-Evaluation  02/14/19    Authorization Type  Progress reports period beginning 11/22/2018.    OT Start Time  1057    OT Stop Time  1142    OT Time Calculation (min)  45 min    Activity Tolerance  Patient tolerated treatment well;Patient limited by fatigue    Behavior During Therapy  [facility] for tasks assessed/performed;Flat affect       Past Medical History:  Diagnosis Date  . Anemia   . Anxiety   . Arthritis   . Asthma   . CAD (coronary artery disease)    per patient  . Depression   . Family history of colonic polyps   . GERD (gastroesophageal reflux disease)   . HA (headache)   . HTN (hypertension)   . Major depressive disorder, recurrent episode, moderate ([place])   . Obstructive sleep apnea     Past Surgical History:  Procedure Laterality Date  . CARDIAC CATHETERIZATION  02/19/15, 06/2009, 08/2011  . COLONOSCOPY N/A 04/22/2015   Procedure: COLONOSCOPY;  Surgeon: Hulen Luster, MD;  Location: [facility] ENDOSCOPY;  Service: Gastroenterology;  Laterality: N/A;  . TUBAL LIGATION      There were no vitals filed for this visit.  Subjective Assessment - 01/05/19 1305    Subjective   Pt.'s sister present today.   Patient is accompained by:  Family member    Pertinent History  Pt. is a 63 y.o. female who was diagnosed with a CVA on 06/24/2018. Pt. has been receiving outpatient PT services, and is now appropriate for outpatient OT services.  Pt. has a history of Seizures. Pt.'s daughter reports that she has been diagnosed with early dementia recently, and is  scheduled to be evaluated by specialists in [place] in March of 2020. Pt. has supportive children, and resides with her daughter who assists the patient with ADLs, and IADLs.  Pt. is waiting for home care aides through the [employer] program.    Patient Stated Goals  to do as much as she can    Currently in Pain?  No/denies      OT TREATMENT    Neuro muscular re-education:  Pt. worked on grasping 1" resistive cubes alternating thumb opposition to the tip of the 2nd digit while the board is placed at a vertical angle. Pt. worked on pressing the cubes back into place while alternating isolated 2nd through 5th digit extension.  Therapeutic Exercise:  Pt. performed 1.5# dowel ex. For UE strengthening secondary to weakness. Bilateral shoulder flexion, chest press, circular patterns. 1# dumbbell ex. for elbow flexion and extension, and forearm supination/pronation. Pt. required visual demonstration for proper technique, and hand position during the exercises. Pt. performed left hand gross gripping with grip strengthener. Pt. worked on sustaining grip while grasping pegs and reaching at various heights to encourage extension. The gripper was placed in the 2nd resistive slot with the white resistive spring. Pt. worked on pinch strengthening in the left hand for lateral, and 3pt. pinch using yellow, red, and green resistive clips. Pt. worked  on placing the clips at various vertical and horizontal angles.Pt. was provided with tactile cues, and constant verbal cues for hand position during 3pt. Pinch.                            OT Education - 01/05/19 1306    Education Details  ROM, reaching tasks, handwriting    Person(s) Educated  Patient    Methods  Explanation;Demonstration;Verbal cues    Comprehension  Verbalized understanding;Returned demonstration;Verbal cues required       OT Short Term Goals - 11/22/18 1741      OT SHORT TERM GOAL #1   Title  --    Baseline  --         OT Long Term Goals - 12/29/18 1631      OT LONG TERM GOAL #1   Title  Pt. will increase UE strength by 2 mm grades to assist with ADLs, and IADLs  (Pended)     Baseline  Eval:  limited UE strength  (Pended)     Time  12  (Pended)     Period  Weeks  (Pended)     Status  On-going  (Pended)     Target Date  02/11/19  (Pended)       OT LONG TERM GOAL #2   Title  Pt. will perform self-feeding skills with modified independence  (Pended)     Baseline  Eval: Pt. hand difficulty secondary to tremors.  (Pended)     Time  12  (Pended)     Period  Weeks  (Pended)     Status  On-going  (Pended)     Target Date  02/14/19  (Pended)       OT LONG TERM GOAL #3   Title  Pt. will improve right grip strength to be able to open a drink  (Pended)     Baseline  Eval: Pt. has difficulty  (Pended)     Time  12  (Pended)     Period  Weeks  (Pended)     Status  On-going  (Pended)     Target Date  02/14/19  (Pended)       OT LONG TERM GOAL #4   Title  Pt. will be independent with making a sandwich  (Pended)     Baseline  Eval: Pt. is unable  (Pended)     Time  12  (Pended)     Period  Weeks  (Pended)     Status  On-going  (Pended)     Target Date  02/14/19  (Pended)       OT LONG TERM GOAL #5   Title  Pt. will improve [facility] skills to be able to open packets, and packages.  (Pended)     Baseline  Eval: Pt. is unable   (Pended)     Time  12  (Pended)     Status  On-going  (Pended)     Target Date  02/14/19  (Pended)             Plan - 01/05/19 1307    Clinical Impression Statement Pt. Reports that her home health aide came this morning, but she did not let her in the house because she just sits there on her cell phone, and has not helped her, and she missed meals. Pt. continues to require extensive cues, and assist to perfrom exercises. Pt. presented with less delay, and quicker responses  during ADL, and IADL tasks. Pt. conitnues to work on improving UE functioning in order to increase  engagement in [place], and IADL tasks, increase independence, and decrease caregiver burden.    Occupational Profile and client history currently impacting functional performance  Pt. has adult children, enjoyed going shopping, cooking/baking. Pt.'s daughter reports that her mother "did not like to lay around" and was her motivator.     Occupational performance deficits (Please refer to evaluation for details):  ADL's    Rehab Potential  Good    Current Impairments/barriers affecting progress:  Motivation, cognition, multiple comorbidities    OT Frequency  2x / week    OT Duration  12 weeks    OT Treatment/Interventions  Self-care/ADL training;DME and/or AE instruction;Therapeutic exercise;Therapeutic activities;Patient/family education;Neuromuscular education    Clinical Decision Making  Several treatment options, min-mod task modification necessary    Consulted and Agree with Plan of Care  Family member/caregiver;Patient    Family Member Consulted  Daughter       Patient will benefit from skilled therapeutic intervention in order to improve the following deficits and impairments:  Decreased cognition, Decreased strength, Impaired UE functional use, Pain, Decreased knowledge of use of DME, Decreased coordination, Decreased endurance, Decreased activity tolerance, Decreased balance  Visit Diagnosis: Muscle weakness (generalized)  Other lack of coordination    Problem List Patient Active Problem List   Diagnosis Date Noted  . Stroke (cerebrum) ([place]) 06/25/2018  . CVA (cerebral vascular accident) ([place]) 06/24/2018  . Anxiety 10/08/2015  . Symptomatic bradycardia 10/08/2015  . CAD (coronary artery disease) 04/16/2015  . Chronic constipation 04/16/2015  . HTN (hypertension) 04/16/2015  . Arthritis, degenerative 04/16/2015  . Allergic rhinitis, seasonal 04/16/2015  . Avitaminosis D 04/16/2015  . Depression, major, recurrent, moderate ([place]) 04/15/2015  . Atypical chest pain 06/26/2013  .  Brash 06/26/2013  . Arthritis 06/26/2013  . Asthma, intermittent 01/17/2013  . Airway hyperreactivity 07/20/2011  . GERD (gastroesophageal reflux disease) 07/20/2011  . HCD (hypertensive cardiovascular disease) 07/20/2011    Harrel Carina, MS, OTR/L 01/05/2019, 2:17 PM  [place] MAIN [facility] SERVICES [address] [place], [place], [address] Phone: [phone]   Fax:  [phone]  Name: Carly Marks MRN: [id number] Date of Birth: [birth year]

## 2019-01-09 ENCOUNTER — Ambulatory Visit: Payer: Medicare Other

## 2019-01-09 ENCOUNTER — Ambulatory Visit: Payer: Medicare Other | Admitting: Occupational Therapy

## 2019-01-12 ENCOUNTER — Ambulatory Visit: Payer: Medicare Other

## 2019-01-12 ENCOUNTER — Ambulatory Visit: Payer: Medicare Other | Attending: Internal Medicine | Admitting: Occupational Therapy

## 2019-01-12 DIAGNOSIS — R278 Other lack of coordination: Secondary | ICD-10-CM

## 2019-01-12 DIAGNOSIS — M6281 Muscle weakness (generalized): Secondary | ICD-10-CM | POA: Insufficient documentation

## 2019-01-16 ENCOUNTER — Encounter: Payer: Self-pay | Admitting: Occupational Therapy

## 2019-01-16 ENCOUNTER — Ambulatory Visit: Payer: Medicare Other | Admitting: Occupational Therapy

## 2019-01-16 ENCOUNTER — Ambulatory Visit: Payer: Medicare Other

## 2019-01-16 DIAGNOSIS — M6281 Muscle weakness (generalized): Secondary | ICD-10-CM

## 2019-01-16 DIAGNOSIS — R278 Other lack of coordination: Secondary | ICD-10-CM

## 2019-01-16 NOTE — Therapy (Signed)
[place] MAIN [facility] SERVICES [address] [place], [place], [address] Phone: [phone]   Fax:  [phone]  Occupational Therapy Treatment  Patient Details  Name: Carly Marks MRN: [id number] Date of Birth: 10-08-1956 Referring Provider (OT): Quay Burow, [place]   Encounter Date: 01/12/2019  OT End of Session - 01/16/19 0907    Visit Number  8    Number of Visits  24    Date for OT Re-Evaluation  02/14/19    Authorization Type  Progress reports period beginning 11/22/2018.    OT Start Time  1015    OT Stop Time  1100    OT Time Calculation (min)  45 min    Activity Tolerance  Patient tolerated treatment well;Patient limited by fatigue    Behavior During Therapy  [facility] for tasks assessed/performed;Flat affect       Past Medical History:  Diagnosis Date  . Anemia   . Anxiety   . Arthritis   . Asthma   . CAD (coronary artery disease)    per patient  . Depression   . Family history of colonic polyps   . GERD (gastroesophageal reflux disease)   . HA (headache)   . HTN (hypertension)   . Major depressive disorder, recurrent episode, moderate ([place])   . Obstructive sleep apnea     Past Surgical History:  Procedure Laterality Date  . CARDIAC CATHETERIZATION  02/19/15, 06/2009, 08/2011  . COLONOSCOPY N/A 04/22/2015   Procedure: COLONOSCOPY;  Surgeon: Hulen Luster, MD;  Location: [facility] ENDOSCOPY;  Service: Gastroenterology;  Laterality: N/A;  . TUBAL LIGATION      There were no vitals filed for this visit.  Subjective Assessment - 01/16/19 0858    Subjective   Patient reports she is doing well, no complaints.    Pertinent History  Pt. is a 63 y.o. female who was diagnosed with a CVA on 06/24/2018. Pt. has been receiving outpatient PT services, and is now appropriate for outpatient OT services.  Pt. has a history of Seizures. Pt.'s daughter reports that she has been diagnosed with early dementia recently, and is scheduled to be evaluated by  specialists in [place] in March of 2020. Pt. has supportive children, and resides with her daughter who assists the patient with ADLs, and IADLs.  Pt. is waiting for home care aides through the [employer] program.    Patient Stated Goals  to do as much as she can    Currently in Pain?  No/denies    Pain Score  0-No pain    Multiple Pain Sites  No      Therapeutic Activity: Patient seen for focus on design copy Pattern with use of colored pegs 1oo board/Judy board, Able to determine simple pattern without cues and complete task with very minimal cues for design, other cues were for prehension patterns with right hand.   2nd pattern more difficult and patient required moderate cues to complete more complex design.  Difficulty with problem solving to correct mistakes and requires therapist feedback and guidance at times. Patient appears to demonstrate some difficulty with diagonal patterns in the design and requires blocking with paper to detect design.    Patient required increased time to complete design copy tasks especially with more complex design.  She was able to detect simple patterns such as a row with all one color but had more difficulty with diagonal patterns and required modifications to task to detect and complete pattern.  Patient requires cues  at times for manipulation skills of objects and prehension patterns.  She has been more interactive in the last few sessions however when she is focused on task she has difficulty with  divided attention with completing task and attending to therapist/conversation, this produces a delay processing for responses.  Continue to work towards goals in plan of care to increase independence in daily tasks.                      OT Education - 01/16/19 0907    Education Details  diagonal patterns, prehension skills    Person(s) Educated  Patient    Methods  Explanation;Demonstration;Verbal cues    Comprehension  Verbalized  understanding;Returned demonstration;Verbal cues required       OT Short Term Goals - 11/22/18 1741      OT SHORT TERM GOAL #1   Title  --    Baseline  --        OT Long Term Goals - 12/29/18 1631      OT LONG TERM GOAL #1   Title  Pt. will increase UE strength by 2 mm grades to assist with ADLs, and IADLs  (Pended)     Baseline  Eval:  limited UE strength  (Pended)     Time  12  (Pended)     Period  Weeks  (Pended)     Status  On-going  (Pended)     Target Date  02/11/19  (Pended)       OT LONG TERM GOAL #2   Title  Pt. will perform self-feeding skills with modified independence  (Pended)     Baseline  Eval: Pt. hand difficulty secondary to tremors.  (Pended)     Time  12  (Pended)     Period  Weeks  (Pended)     Status  On-going  (Pended)     Target Date  02/14/19  (Pended)       OT LONG TERM GOAL #3   Title  Pt. will improve right grip strength to be able to open a drink  (Pended)     Baseline  Eval: Pt. has difficulty  (Pended)     Time  12  (Pended)     Period  Weeks  (Pended)     Status  On-going  (Pended)     Target Date  02/14/19  (Pended)       OT LONG TERM GOAL #4   Title  Pt. will be independent with making a sandwich  (Pended)     Baseline  Eval: Pt. is unable  (Pended)     Time  12  (Pended)     Period  Weeks  (Pended)     Status  On-going  (Pended)     Target Date  02/14/19  (Pended)       OT LONG TERM GOAL #5   Title  Pt. will improve [facility] skills to be able to open packets, and packages.  (Pended)     Baseline  Eval: Pt. is unable   (Pended)     Time  12  (Pended)     Status  On-going  (Pended)     Target Date  02/14/19  (Pended)             Plan - 01/16/19 0908    Clinical Impression Statement  Patient required increased time to complete design copy tasks especially with more complex design.  She was able to detect simple  patterns such as a row with all one color but had more difficulty with diagonal patterns and required modifications to  task to detect and complete pattern.  Patient requires cues at times for manipulation skills of objects and prehension patterns.  She has been more interactive in the last few sessions however when she is focused on task she has difficulty with  divided attention with completing task and attending to therapist/conversation, this produces a delay processing for responses.  Continue to work towards goals in plan of care to increase independence in daily tasks.      Occupational Profile and client history currently impacting functional performance  Pt. has adult children, enjoyed going shopping, cooking/baking. Pt.'s daughter reports that her mother "did not like to lay around" and was her motivator.     Occupational performance deficits (Please refer to evaluation for details):  ADL's;IADL's;Social Participation    Body Structure / Function / Physical Skills  ADL;Dexterity;Flexibility;ROM;Strength;Coordination;FMC;IADL;Endurance;UE functional use;Decreased knowledge of use of DME    Cognitive Skills  Attention;Memory;Problem Solve;Safety Awareness    Psychosocial Skills  Environmental  Adaptations;Routines and Behaviors;Habits    Rehab Potential  Good    OT Frequency  2x / week    OT Duration  12 weeks    OT Treatment/Interventions  Self-care/ADL training;DME and/or AE instruction;Therapeutic exercise;Therapeutic activities;Patient/family education;Neuromuscular education;Cognitive remediation/compensation    Consulted and Agree with Plan of Care  Patient       Patient will benefit from skilled therapeutic intervention in order to improve the following deficits and impairments:  Body Structure / Function / Physical Skills, Psychosocial Skills, Cognitive Skills  Visit Diagnosis: Muscle weakness (generalized)  Other lack of coordination    Problem List Patient Active Problem List   Diagnosis Date Noted  . Stroke (cerebrum) ([place]) 06/25/2018  . CVA (cerebral vascular accident) ([place]) 06/24/2018   . Anxiety 10/08/2015  . Symptomatic bradycardia 10/08/2015  . CAD (coronary artery disease) 04/16/2015  . Chronic constipation 04/16/2015  . HTN (hypertension) 04/16/2015  . Arthritis, degenerative 04/16/2015  . Allergic rhinitis, seasonal 04/16/2015  . Avitaminosis D 04/16/2015  . Depression, major, recurrent, moderate ([place]) 04/15/2015  . Atypical chest pain 06/26/2013  . Brash 06/26/2013  . Arthritis 06/26/2013  . Asthma, intermittent 01/17/2013  . Airway hyperreactivity 07/20/2011  . GERD (gastroesophageal reflux disease) 07/20/2011  . HCD (hypertensive cardiovascular disease) 07/20/2011   Timithy Arons T Tomasita Morrow, OTR/L, CLT  Jajuan Skoog 01/16/2019, 9:10 AM  [place] MAIN [facility] SERVICES [address] [place], [place], [address] Phone: [phone]   Fax:  [phone]  Name: Carly Marks MRN: [id number] Date of Birth: [birth year]

## 2019-01-16 NOTE — Therapy (Signed)
[place] MAIN [facility] SERVICES [address] [place], [place], [address] Phone: [phone]   Fax:  [phone]  Occupational Therapy Treatment  Patient Details  Name: Carly Marks MRN: [id number] Date of Birth: [birth year] Referring Provider (OT): Quay Burow, [place]   Encounter Date: 01/16/2019  OT End of Session - 01/16/19 1401    Visit Number  9    Number of Visits  24    Date for OT Re-Evaluation  02/14/19    Authorization Type  Progress reports period beginning 11/22/2018.    OT Start Time  1145    OT Stop Time  1230    OT Time Calculation (min)  45 min    Activity Tolerance  Patient tolerated treatment well;Patient limited by fatigue    Behavior During Therapy  [facility] for tasks assessed/performed;Flat affect       Past Medical History:  Diagnosis Date  . Anemia   . Anxiety   . Arthritis   . Asthma   . CAD (coronary artery disease)    per patient  . Depression   . Family history of colonic polyps   . GERD (gastroesophageal reflux disease)   . HA (headache)   . HTN (hypertension)   . Major depressive disorder, recurrent episode, moderate ([place])   . Obstructive sleep apnea     Past Surgical History:  Procedure Laterality Date  . CARDIAC CATHETERIZATION  02/19/15, 06/2009, 08/2011  . COLONOSCOPY N/A 04/22/2015   Procedure: COLONOSCOPY;  Surgeon: Hulen Luster, MD;  Location: [facility] ENDOSCOPY;  Service: Gastroenterology;  Laterality: N/A;  . TUBAL LIGATION      There were no vitals filed for this visit.  Subjective Assessment - 01/16/19 1400    Subjective   Pt.'s grandson was with the pt. today.    Patient is accompanied by:  Family member    Pertinent History  Pt. is a 63 y.o. female who was diagnosed with a CVA on 06/24/2018. Pt. has been receiving outpatient PT services, and is now appropriate for outpatient OT services.  Pt. has a history of Seizures. Pt.'s daughter reports that she has been diagnosed with early dementia recently, and  is scheduled to be evaluated by specialists in [place] in March of 2020. Pt. has supportive children, and resides with her daughter who assists the patient with ADLs, and IADLs.  Pt. is waiting for home care aides through the [facility] program.    Patient Stated Goals  to do as much as she can    Currently in Pain?  No/denies       OT TREATMENT    Neuro muscular re-education:  Pt. performed [facility] tasks using the Grooved pegboard. Pt. worked on grasping the grooved pegs from a horizontal position, and moving the pegs to a vertical position in the hand to prepare for placing them in the grooved slot. Pt. Used her left hand to assist the right hand in twisting the peg for the correct direction. Pt. Had difficulty, however upon asking, pt. Reported forgetting to put her glasses on this morning.   Therapeutic Exercise:  Pt. performed 2# dowel ex. For UE strengthening secondary to weakness. Bilateral shoulder flexion, chest press, and circular patterns were performed. 2 sets of 10. Attempted elbow flexion, extension with theraband, and dumbbell. Pt. was unable to to complete with extensive verbal cues, tactile cues, and visual demonstration. Pt. performed gross gripping with grip strengthener. Pt. worked on sustaining grip while grasping pegs and reaching at various heights.  Gripper was placed in the 2nd resistive slot initially, and moved to the first with the white resistive spring. Pt. Worked on pinch strengthening in the right hand for lateral, and 3pt. pinch using yellow, red, green, and blue resistive clips. Pt. worked on placing the clips at various vertical and horizontal angles. Tactile and verbal cues were required for eliciting the desired movement.                         OT Education - 01/16/19 1400    Education Details  Ther. ex, [facility]    Person(s) Educated  Patient    Comprehension  Verbalized understanding;Returned demonstration;Verbal cues required       OT Short  Term Goals - 11/22/18 1741      OT SHORT TERM GOAL #1   Title  --    Baseline  --        OT Long Term Goals - 12/29/18 1631      OT LONG TERM GOAL #1   Title  Pt. will increase UE strength by 2 mm grades to assist with ADLs, and IADLs  (Pended)     Baseline  Eval:  limited UE strength  (Pended)     Time  12  (Pended)     Period  Weeks  (Pended)     Status  On-going  (Pended)     Target Date  02/11/19  (Pended)       OT LONG TERM GOAL #2   Title  Pt. will perform self-feeding skills with modified independence  (Pended)     Baseline  Eval: Pt. hand difficulty secondary to tremors.  (Pended)     Time  12  (Pended)     Period  Weeks  (Pended)     Status  On-going  (Pended)     Target Date  02/14/19  (Pended)       OT LONG TERM GOAL #3   Title  Pt. will improve right grip strength to be able to open a drink  (Pended)     Baseline  Eval: Pt. has difficulty  (Pended)     Time  12  (Pended)     Period  Weeks  (Pended)     Status  On-going  (Pended)     Target Date  02/14/19  (Pended)       OT LONG TERM GOAL #4   Title  Pt. will be independent with making a sandwich  (Pended)     Baseline  Eval: Pt. is unable  (Pended)     Time  12  (Pended)     Period  Weeks  (Pended)     Status  On-going  (Pended)     Target Date  02/14/19  (Pended)       OT LONG TERM GOAL #5   Title  Pt. will improve [facility] skills to be able to open packets, and packages.  (Pended)     Baseline  Eval: Pt. is unable   (Pended)     Time  12  (Pended)     Status  On-going  (Pended)     Target Date  02/14/19  (Pended)             Plan - 01/16/19 1401    Clinical Impression Statement  Pt. continues to require increased time to initiate tasks, and has delayed processing. Pt. requires extensive cues for hand placement during the exercises. Pt. continues to work  on improving RUE strength, and [facility] skills in order to improve UE functioning during ADLs, and IADLs. COntinues to provide education to the pt. and  family about activiyies to follow through with at home.    Occupational Profile and client history currently impacting functional performance  Pt. has adult children, enjoyed going shopping, cooking/baking. Pt.'s daughter reports that her mother "did not like to lay around" and was her motivator.     Occupational performance deficits (Please refer to evaluation for details):  ADL's;IADL's;Social Participation    Body Structure / Function / Physical Skills  ADL;Dexterity;Flexibility;ROM;Strength;Coordination;FMC;IADL;Endurance;UE functional use;Decreased knowledge of use of DME    Cognitive Skills  Attention;Memory;Problem Solve;Safety Awareness    Psychosocial Skills  Environmental  Adaptations;Routines and Behaviors;Habits    Rehab Potential  Good    Clinical Decision Making  Several treatment options, min-mod task modification necessary    OT Frequency  2x / week    OT Duration  12 weeks    OT Treatment/Interventions  Self-care/ADL training;DME and/or AE instruction;Therapeutic exercise;Therapeutic activities;Patient/family education;Neuromuscular education;Cognitive remediation/compensation    Consulted and Agree with Plan of Care  Patient    Family Member Consulted  Daughter       Patient will benefit from skilled therapeutic intervention in order to improve the following deficits and impairments:  Body Structure / Function / Physical Skills, Psychosocial Skills, Cognitive Skills  Visit Diagnosis: Muscle weakness (generalized)  Other lack of coordination    Problem List Patient Active Problem List   Diagnosis Date Noted  . Stroke (cerebrum) ([place]) 06/25/2018  . CVA (cerebral vascular accident) ([place]) 06/24/2018  . Anxiety 10/08/2015  . Symptomatic bradycardia 10/08/2015  . CAD (coronary artery disease) 04/16/2015  . Chronic constipation 04/16/2015  . HTN (hypertension) 04/16/2015  . Arthritis, degenerative 04/16/2015  . Allergic rhinitis, seasonal 04/16/2015  . Avitaminosis D  04/16/2015  . Depression, major, recurrent, moderate ([place]) 04/15/2015  . Atypical chest pain 06/26/2013  . Brash 06/26/2013  . Arthritis 06/26/2013  . Asthma, intermittent 01/17/2013  . Airway hyperreactivity 07/20/2011  . GERD (gastroesophageal reflux disease) 07/20/2011  . HCD (hypertensive cardiovascular disease) 07/20/2011    Harrel Carina, MS, OTR/L 01/16/2019, 2:07 PM  [place] MAIN [facility] SERVICES [address] [place], [place], [address] Phone: [phone]   Fax:  [phone]  Name: Carly Marks MRN: [id number] Date of Birth: 1956-03-24

## 2019-01-19 ENCOUNTER — Other Ambulatory Visit: Payer: Self-pay

## 2019-01-19 ENCOUNTER — Encounter: Payer: Self-pay | Admitting: Occupational Therapy

## 2019-01-19 ENCOUNTER — Ambulatory Visit: Payer: Medicare Other | Admitting: Physical Therapy

## 2019-01-19 ENCOUNTER — Ambulatory Visit: Payer: Medicare Other | Admitting: Occupational Therapy

## 2019-01-19 DIAGNOSIS — M6281 Muscle weakness (generalized): Secondary | ICD-10-CM

## 2019-01-19 DIAGNOSIS — R278 Other lack of coordination: Secondary | ICD-10-CM

## 2019-01-19 NOTE — Therapy (Signed)
[place] MAIN [facility] SERVICES [address] [place], [place], [address] Phone: [phone]   Fax:  [phone]  Occupational Therapy Progress Note  Dates of reporting period  11/22/2018   to   01/19/2019  Patient Details  Name: Carly Marks MRN: [id number] Date of Birth: 07/19/1956 Referring Provider (OT): Quay Burow, [place]   Encounter Date: 01/19/2019  OT End of Session - 01/19/19 1023    Visit Number  10    Number of Visits  24    Date for OT Re-Evaluation  02/14/19    Authorization Type  Progress reports period beginning 01/19/2019   OT Start Time  0930    OT Stop Time  1015    OT Time Calculation (min)  45 min    Activity Tolerance  Patient tolerated treatment well;Patient limited by fatigue    Behavior During Therapy  [facility] for tasks assessed/performed;Flat affect       Past Medical History:  Diagnosis Date  . Anemia   . Anxiety   . Arthritis   . Asthma   . CAD (coronary artery disease)    per patient  . Depression   . Family history of colonic polyps   . GERD (gastroesophageal reflux disease)   . HA (headache)   . HTN (hypertension)   . Major depressive disorder, recurrent episode, moderate ([place])   . Obstructive sleep apnea     Past Surgical History:  Procedure Laterality Date  . CARDIAC CATHETERIZATION  02/19/15, 06/2009, 08/2011  . COLONOSCOPY N/A 04/22/2015   Procedure: COLONOSCOPY;  Surgeon: Hulen Luster, MD;  Location: [facility] ENDOSCOPY;  Service: Gastroenterology;  Laterality: N/A;  . TUBAL LIGATION      There were no vitals filed for this visit.  Subjective Assessment - 01/19/19 1013    Subjective   No family present with pt. upon arrival today.    Patient is accompanied by:  Family member    Pertinent History  Pt. is a 63 y.o. female who was diagnosed with a CVA on 06/24/2018. Pt. has been receiving outpatient PT services, and is now appropriate for outpatient OT services.  Pt. has a history of Seizures. Pt.'s daughter  reports that she has been diagnosed with early dementia recently, and is scheduled to be evaluated by specialists in [place] in March of 2020. Pt. has supportive children, and resides with her daughter who assists the patient with ADLs, and IADLs.  Pt. is waiting for home care aides through the [facility] program.    Patient Stated Goals  to do as much as she can    Currently in Pain?  No/denies      OT TREATMENT    Therapeutic Exercise:  Pt. worked on performing green theraputty exercises for gross gripping, lateral pinch, 3pt. pinch, thumb opposition, formulating rolls with digit extension, and grasping small 1/2" And 1/4" objects from the putty. With her right hand. Pt. required increased time to complete, visual cues, verbal cues, as well as visual demonstration. Pt. was provided with a visual handout with online access capabilities through [facility].                        OT Education - 01/19/19 1023    Education Details  Ther. ex., [place]    Person(s) Educated  Patient    Methods  Explanation;Demonstration;Verbal cues    Comprehension  Verbalized understanding;Returned demonstration;Verbal cues required       OT Short Term Goals -  11/22/18 1741      OT SHORT TERM GOAL #1   Title  --    Baseline  --        OT Long Term Goals - 01/19/19 1027      OT LONG TERM GOAL #1   Title  Pt. will increase UE strength by 2 mm grades to assist with ADLs, and IADLs    Baseline  01/19/2019: Pt. conitnues to present with limited UE strength    Time  12    Period  Weeks    Status  On-going    Target Date  02/14/19      OT LONG TERM GOAL #2   Title  Pt. will perform self-feeding skills with modified independence    Baseline  01/19/2019: Pt. is making progress, however continues to be limitied.    Time  12    Period  Weeks    Status  On-going    Target Date  02/14/19      OT LONG TERM GOAL #3   Title  Pt. will improve right grip strength to be able to open a drink     Baseline  01/19/2019: Pt. continues to have difficulty opening a drink.    Time  12    Period  Weeks    Status  On-going    Target Date  02/14/19      OT LONG TERM GOAL #4   Title  Pt. will be independent with making a sandwich    Baseline  01/19/2019: Pt. conitnues to have difficulty with light meal prep, making a sandwich    Time  12    Period  Weeks    Status  On-going    Target Date  02/14/19      OT LONG TERM GOAL #5   Title  Pt. will improve [facility] skills to be able to open packets, and packages.    Baseline  01/19/2019: Pt. has difficuly    Time  12    Period  Weeks    Status  On-going    Target Date  02/14/19            Plan - 01/19/19 1024    Clinical Impression Statement  Pt. continues to present with delayed processing during ADLs, and IADL tasks. Pt. continues to require verbal cues, and tactile cues to continue the movement secondary to delayed processing. Pt. was provided with a HEP for green theraputty through [place] for family to assist with pt. at home. Pt. continues to work on improving grip strength, lateral pinch strength, and [place] skills in order to improve overall ADL, and IADL functioning.     Occupational Profile and client history currently impacting functional performance  Pt. has adult children, enjoyed going shopping, cooking/baking. Pt.'s daughter reports that her mother "did not like to lay around" and was her motivator.     Occupational performance deficits (Please refer to evaluation for details):  ADL's;IADL's;Social Participation    Body Structure / Function / Physical Skills  ADL;Dexterity;Flexibility;ROM;Strength;Coordination;FMC;IADL;Endurance;UE functional use;Decreased knowledge of use of DME    Cognitive Skills  Attention;Memory;Problem Solve;Safety Awareness    Psychosocial Skills  Environmental  Adaptations;Routines and Behaviors;Habits    Rehab Potential  Good    Clinical Decision Making  Several treatment options, min-mod task modification  necessary    OT Frequency  2x / week    OT Duration  12 weeks    OT Treatment/Interventions  Self-care/ADL training;DME and/or AE instruction;Therapeutic exercise;Therapeutic activities;Patient/family education;Neuromuscular education;Cognitive  remediation/compensation    Consulted and Agree with Plan of Care  Patient    Family Member Consulted  Daughter       Patient will benefit from skilled therapeutic intervention in order to improve the following deficits and impairments:  Body Structure / Function / Physical Skills, Psychosocial Skills, Cognitive Skills  Visit Diagnosis: Muscle weakness (generalized)  Other lack of coordination    Problem List Patient Active Problem List   Diagnosis Date Noted  . Stroke (cerebrum) ([place]) 06/25/2018  . CVA (cerebral vascular accident) ([place]) 06/24/2018  . Anxiety 10/08/2015  . Symptomatic bradycardia 10/08/2015  . CAD (coronary artery disease) 04/16/2015  . Chronic constipation 04/16/2015  . HTN (hypertension) 04/16/2015  . Arthritis, degenerative 04/16/2015  . Allergic rhinitis, seasonal 04/16/2015  . Avitaminosis D 04/16/2015  . Depression, major, recurrent, moderate ([place]) 04/15/2015  . Atypical chest pain 06/26/2013  . Brash 06/26/2013  . Arthritis 06/26/2013  . Asthma, intermittent 01/17/2013  . Airway hyperreactivity 07/20/2011  . GERD (gastroesophageal reflux disease) 07/20/2011  . HCD (hypertensive cardiovascular disease) 07/20/2011    Harrel Carina, MS, OTR/L 01/19/2019, 10:34 AM  [place] MAIN [facility] SERVICES [address] [place], [place], [address] Phone: [phone]   Fax:  [phone]  Name: Carly Marks MRN: [id number] Date of Birth: 1956/05/23

## 2019-01-23 ENCOUNTER — Ambulatory Visit: Payer: Medicare Other | Admitting: Occupational Therapy

## 2019-01-23 ENCOUNTER — Other Ambulatory Visit: Payer: Self-pay

## 2019-01-23 ENCOUNTER — Ambulatory Visit: Payer: Medicare Other

## 2019-01-23 ENCOUNTER — Encounter: Payer: Self-pay | Admitting: Occupational Therapy

## 2019-01-23 DIAGNOSIS — M6281 Muscle weakness (generalized): Secondary | ICD-10-CM

## 2019-01-23 DIAGNOSIS — R278 Other lack of coordination: Secondary | ICD-10-CM

## 2019-01-24 ENCOUNTER — Encounter: Payer: Medicare Other | Admitting: Physical Therapy

## 2019-01-25 ENCOUNTER — Other Ambulatory Visit: Payer: Self-pay

## 2019-01-25 ENCOUNTER — Ambulatory Visit: Payer: Medicare Other | Admitting: Physical Therapy

## 2019-01-25 ENCOUNTER — Ambulatory Visit: Payer: Medicare Other | Admitting: Occupational Therapy

## 2019-01-25 DIAGNOSIS — M6281 Muscle weakness (generalized): Secondary | ICD-10-CM

## 2019-01-25 DIAGNOSIS — R278 Other lack of coordination: Secondary | ICD-10-CM

## 2019-01-25 NOTE — Therapy (Signed)
[place] MAIN [facility] SERVICES [address] [place], [place], [address] Phone: [phone]   Fax:  [phone]  Occupational Therapy Treatment  Patient Details  Name: Carly Marks MRN: [id number] Date of Birth: [birth year] Referring Provider (OT): Quay Burow, [place]   Encounter Date: 01/23/2019  OT End of Session - 01/25/19 1546    Visit Number  11    Number of Visits  24    Date for OT Re-Evaluation  02/14/19    Authorization Type  Progress reports period beginning 01/19/2019    OT Start Time  0933    OT Stop Time  1015    OT Time Calculation (min)  42 min    Activity Tolerance  Patient tolerated treatment well;Patient limited by fatigue    Behavior During Therapy  [facility] for tasks assessed/performed;Flat affect       Past Medical History:  Diagnosis Date  . Anemia   . Anxiety   . Arthritis   . Asthma   . CAD (coronary artery disease)    per patient  . Depression   . Family history of colonic polyps   . GERD (gastroesophageal reflux disease)   . HA (headache)   . HTN (hypertension)   . Major depressive disorder, recurrent episode, moderate ([place])   . Obstructive sleep apnea     Past Surgical History:  Procedure Laterality Date  . CARDIAC CATHETERIZATION  02/19/15, 06/2009, 08/2011  . COLONOSCOPY N/A 04/22/2015   Procedure: COLONOSCOPY;  Surgeon: Hulen Luster, MD;  [facility]: [facility] ENDOSCOPY;  Service: Gastroenterology;  Laterality: N/A;  . TUBAL LIGATION      There were no vitals filed for this visit.  Subjective Assessment - 01/25/19 1546    Subjective   Patient reports she has been doing homework at home with her green putty, grandson present and was not with her while she did exercises but he will ask his mom to see if there was any questions or difficulty with following the instructions.      Pertinent History  Pt. is a 63 y.o. female who was diagnosed with a CVA on 06/24/2018. Pt. has been receiving outpatient PT services, and is  now appropriate for outpatient OT services.  Pt. has a history of Seizures. Pt.'s daughter reports that she has been diagnosed with early dementia recently, and is scheduled to be evaluated by specialists in [place] in March of 2020. Pt. has supportive children, and resides with her daughter who assists the patient with ADLs, and IADLs.  Pt. is waiting for home care aides through the [facility] program.    Patient Stated Goals  to do as much as she can    Currently in Pain?  No/denies    Pain Score  0-No pain         Therex: Patient was seen this date for finger strengthening with Resistive pinch pins all levels except black which she was unable to perform.  Cues for lateral pinch, 3 point pinch and 2 point pinch and occasional guiding to place onto yardstick to encourage increased reach.  Patient seen for additional focus on finger strengthening with bulletin board and use of push pins to place into board,  moderate resistive board therefore patient demonstrated some difficulty at times pushing in the pins fully.  Verbal cues and therapist demo for prehension skills.   ADL: Patient seen for focus on Handwriting of her name, address and names of grandchildren.  She required cues for recall of all  4 grandchildren this date for their full names.  Handwriting for name was good, other names was fair to poor, the more she wrote, the smaller the size of the letters and decreased legibility.  Will need continued focus on this area.  Grandson reports pt used to write a lot and had great penmanship.  Response to tx: Patient continues to progress, she still demonstrates slow  processing at times with instructions and with problem solving of tasks.  She is easily distracted during session and requires redirection with repeat of instructions frequently.  She continues to demonstrate muscle weakness and decreased coordination for functional activities.  Grandson would like to see her writing improve.                      OT Education - 01/25/19 1546    Education Details  Ther. ex., [place]    Person(s) Educated  Patient    Methods  Explanation;Demonstration;Verbal cues    Comprehension  Verbalized understanding;Returned demonstration;Verbal cues required       OT Short Term Goals - 11/22/18 1741      OT SHORT TERM GOAL #1   Title  --    Baseline  --        OT Long Term Goals - 01/19/19 1027      OT LONG TERM GOAL #1   Title  Pt. will increase UE strength by 2 mm grades to assist with ADLs, and IADLs    Baseline  01/19/2019: Pt. conitnues to present with limited UE strength    Time  12    Period  Weeks    Status  On-going    Target Date  02/14/19      OT LONG TERM GOAL #2   Title  Pt. will perform self-feeding skills with modified independence    Baseline  01/19/2019: Pt. is making progress, however continues to be limitied.    Time  12    Period  Weeks    Status  On-going    Target Date  02/14/19      OT LONG TERM GOAL #3   Title  Pt. will improve right grip strength to be able to open a drink    Baseline  01/19/2019: Pt. continues to have difficulty opening a drink.    Time  12    Period  Weeks    Status  On-going    Target Date  02/14/19      OT LONG TERM GOAL #4   Title  Pt. will be independent with making a sandwich    Baseline  01/19/2019: Pt. conitnues to have difficulty with light meal prep, making a sandwich    Time  12    Period  Weeks    Status  On-going    Target Date  02/14/19      OT LONG TERM GOAL #5   Title  Pt. will improve [facility] skills to be able to open packets, and packages.    Baseline  01/19/2019: Pt. has difficuly    Time  12    Period  Weeks    Status  On-going    Target Date  02/14/19            Plan - 01/25/19 1547    Clinical Impression Statement  Patient continues to progress, she still demonstrates slow  processing at times with instructions and with problem solving of tasks.  She is easily distracted during  session and requires redirection with repeat  of instructions frequently.  She continues to demonstrate muscle weakness and decreased coordination for functional activities.  Grandson would like to see her writing improve.      Occupational Profile and client history currently impacting functional performance  Pt. has adult children, enjoyed going shopping, cooking/baking. Pt.'s daughter reports that her mother "did not like to lay around" and was her motivator.     Occupational performance deficits (Please refer to evaluation for details):  Other    Body Structure / Function / Physical Skills  ADL;Dexterity;Flexibility;ROM;Strength;Coordination;FMC;IADL;Endurance;UE functional use;Decreased knowledge of use of DME    Cognitive Skills  Attention;Memory;Problem Solve;Safety Awareness    Psychosocial Skills  Environmental  Adaptations;Routines and Behaviors;Habits    Rehab Potential  Good    Clinical Decision Making  Several treatment options, min-mod task modification necessary    OT Frequency  2x / week    OT Duration  12 weeks    OT Treatment/Interventions  Self-care/ADL training;DME and/or AE instruction;Therapeutic exercise;Therapeutic activities;Patient/family education;Neuromuscular education;Cognitive remediation/compensation    Consulted and Agree with Plan of Care  Patient       Patient will benefit from skilled therapeutic intervention in order to improve the following deficits and impairments:  Body Structure / Function / Physical Skills, Psychosocial Skills, Cognitive Skills  Visit Diagnosis: Muscle weakness (generalized)  Other lack of coordination    Problem List Patient Active Problem List   Diagnosis Date Noted  . Stroke (cerebrum) ([place]) 06/25/2018  . CVA (cerebral vascular accident) ([place]) 06/24/2018  . Anxiety 10/08/2015  . Symptomatic bradycardia 10/08/2015  . CAD (coronary artery disease) 04/16/2015  . Chronic constipation 04/16/2015  . HTN (hypertension) 04/16/2015   . Arthritis, degenerative 04/16/2015  . Allergic rhinitis, seasonal 04/16/2015  . Avitaminosis D 04/16/2015  . Depression, major, recurrent, moderate ([place]) 04/15/2015  . Atypical chest pain 06/26/2013  . Brash 06/26/2013  . Arthritis 06/26/2013  . Asthma, intermittent 01/17/2013  . Airway hyperreactivity 07/20/2011  . GERD (gastroesophageal reflux disease) 07/20/2011  . HCD (hypertensive cardiovascular disease) 07/20/2011   Karleen Seebeck T Tomasita Morrow, OTR/L, CLT  Sonu Kruckenberg 01/25/2019, 3:50 PM  [facility] MAIN [facility] SERVICES [address] [place], [place], [address] Phone: [phone]   Fax:  [phone]  Name: Carly Marks MRN: [id number] Date of Birth: 02/21/1956

## 2019-01-30 ENCOUNTER — Encounter: Payer: Self-pay | Admitting: Occupational Therapy

## 2019-01-30 NOTE — Therapy (Signed)
[place] MAIN [facility] SERVICES [address] [place], [place], [address] Phone: [phone]   Fax:  [phone]  Patient Details  Name: Carly Marks MRN: [id number] Date of Birth: September 15, 1956 Referring Provider:  No ref. provider found  Encounter Date: 01/30/2019  Reached out to the pt. via the telephone to check in on the pt., inform them that the clinic is closed due to the pandemic, and answer questions about HEPs.   Harrel Carina, MS, OTR/L 01/30/2019, 1:25 PM  [place] MAIN [facility] SERVICES [address] [place], [place], [address] Phone: [phone]   Fax:  [phone]

## 2019-01-31 ENCOUNTER — Ambulatory Visit: Payer: Medicare Other | Admitting: Occupational Therapy

## 2019-01-31 ENCOUNTER — Ambulatory Visit: Payer: Medicare Other | Admitting: Physical Therapy

## 2019-02-02 ENCOUNTER — Ambulatory Visit: Payer: Medicare Other | Admitting: Occupational Therapy

## 2019-02-02 ENCOUNTER — Ambulatory Visit: Payer: Medicare Other | Admitting: Physical Therapy

## 2019-02-03 ENCOUNTER — Encounter: Payer: Self-pay | Admitting: Occupational Therapy

## 2019-02-04 NOTE — Therapy (Signed)
[place] MAIN [facility] SERVICES [address] [place], [place], [address] Phone: [phone]   Fax:  [phone]  Occupational Therapy Treatment  Patient Details  Name: Carly Marks MRN: [id number] Date of Birth: [birth year] Referring Provider (OT): Quay Burow, [place]   Encounter Date: 01/25/2019  OT End of Session - 02/03/19 1401    Visit Number  12    Number of Visits  24    Date for OT Re-Evaluation  02/14/19    OT Start Time  0930    OT Stop Time  1015    OT Time Calculation (min)  45 min    Activity Tolerance  Patient tolerated treatment well;Patient limited by fatigue    Behavior During Therapy  [facility] for tasks assessed/performed;Flat affect       Past Medical History:  Diagnosis Date  . Anemia   . Anxiety   . Arthritis   . Asthma   . CAD (coronary artery disease)    per patient  . Depression   . Family history of colonic polyps   . GERD (gastroesophageal reflux disease)   . HA (headache)   . HTN (hypertension)   . Major depressive disorder, recurrent episode, moderate ([place])   . Obstructive sleep apnea     Past Surgical History:  Procedure Laterality Date  . CARDIAC CATHETERIZATION  02/19/15, 06/2009, 08/2011  . COLONOSCOPY N/A 04/22/2015   Procedure: COLONOSCOPY;  Surgeon: Hulen Luster, MD;  Location: [facility] ENDOSCOPY;  Service: Gastroenterology;  Laterality: N/A;  . TUBAL LIGATION      There were no vitals filed for this visit.  Subjective Assessment - 02/03/19 1400    Subjective    Patient reports she will miss her grandson when he is gone back to [place] and he has been a big help to her.    Patient is accompanied by:  Family member    Pertinent History  Pt. is a 63 y.o. female who was diagnosed with a CVA on 06/24/2018. Pt. has been receiving outpatient PT services, and is now appropriate for outpatient OT services.  Pt. has a history of Seizures. Pt.'s daughter reports that she has been diagnosed with early dementia  recently, and is scheduled to be evaluated by specialists in [place] in March of 2020. Pt. has supportive children, and resides with her daughter who assists the patient with ADLs, and IADLs.  Pt. is waiting for home care aides through the [employer] program.    Patient Stated Goals  to do as much as she can    Currently in Pain?  No/denies    Pain Score  0-No pain    Multiple Pain Sites  No         Patient reports she will miss her grandson when he is gone back to [place] and he has been a big help to her.   Patient seen for focus on hand writing with medium grip pen, cues for the use of a tripod grasp as well as size of letters and formation of letters to improve legibility. Patient has occasional difficulty with recalling her address at home. Patient performing hand writing with completion of check writing given information for the Payee and amount due. Patient had difficulty with determining where to place the appropriate information however once she was provided with verbal cues she was able to complete with fair to poor legibility. She tends to keep her right elbow and forearm stabilized on the table and does not move her  arm as she is attempting to reach the right side of the page. She requires cues for this skill in order for her handwriting to be more legible. Patient also seen for design copy with a more complex design and required min to moderate cues to complete task. She had difficulty at times with problem-solving especially when a decision needed to be made on changing a color or object.    Response to tx: Patient continues to make progress and has been more interactive in the last couple of sessions. She has occasional instances of staring at an object without talking and has a delayed response when spoken to. Her handwriting improved from last session to this session with greater legibility. Continue to work towards goals to increase independence with necessary daily  tasks.                    OT Education - 02/03/19 1401    Education Details  home exercises    Person(s) Educated  Patient    Methods  Explanation;Demonstration;Verbal cues    Comprehension  Verbalized understanding;Returned demonstration;Verbal cues required       OT Short Term Goals - 11/22/18 1741      OT SHORT TERM GOAL #1   Title  --    Baseline  --        OT Long Term Goals - 01/19/19 1027      OT LONG TERM GOAL #1   Title  Pt. will increase UE strength by 2 mm grades to assist with ADLs, and IADLs    Baseline  01/19/2019: Pt. conitnues to present with limited UE strength    Time  12    Period  Weeks    Status  On-going    Target Date  02/14/19      OT LONG TERM GOAL #2   Title  Pt. will perform self-feeding skills with modified independence    Baseline  01/19/2019: Pt. is making progress, however continues to be limitied.    Time  12    Period  Weeks    Status  On-going    Target Date  02/14/19      OT LONG TERM GOAL #3   Title  Pt. will improve right grip strength to be able to open a drink    Baseline  01/19/2019: Pt. continues to have difficulty opening a drink.    Time  12    Period  Weeks    Status  On-going    Target Date  02/14/19      OT LONG TERM GOAL #4   Title  Pt. will be independent with making a sandwich    Baseline  01/19/2019: Pt. conitnues to have difficulty with light meal prep, making a sandwich    Time  12    Period  Weeks    Status  On-going    Target Date  02/14/19      OT LONG TERM GOAL #5   Title  Pt. will improve [facility] skills to be able to open packets, and packages.    Baseline  01/19/2019: Pt. has difficuly    Time  12    Period  Weeks    Status  On-going    Target Date  02/14/19            Plan - 02/03/19 1401    Clinical Impression Statement  Patient continues to make progress and has been more interactive in the last couple of sessions. She  has occasional instances of staring at an object without  talking and has a delayed response when spoken to. Her handwriting improved from last session to this session with greater legibility. Continue to work towards goals to increase independence with necessary daily tasks.    Occupational Profile and client history currently impacting functional performance  Pt. has adult children, enjoyed going shopping, cooking/baking. Pt.'s daughter reports that her mother "did not like to lay around" and was her motivator.     Occupational performance deficits (Please refer to evaluation for details):  Other;ADL's;IADL's;Social Participation    Body Structure / Function / Physical Skills  ADL;Dexterity;Flexibility;ROM;Strength;Coordination;FMC;IADL;Endurance;UE functional use;Decreased knowledge of use of DME    Cognitive Skills  Attention;Memory;Problem Solve;Safety Awareness    Psychosocial Skills  Environmental  Adaptations;Routines and Behaviors;Habits    Rehab Potential  Good    Clinical Decision Making  Several treatment options, min-mod task modification necessary    OT Frequency  2x / week    OT Duration  12 weeks    OT Treatment/Interventions  Self-care/ADL training;DME and/or AE instruction;Therapeutic exercise;Therapeutic activities;Patient/family education;Neuromuscular education;Cognitive remediation/compensation    Consulted and Agree with Plan of Care  Patient       Patient will benefit from skilled therapeutic intervention in order to improve the following deficits and impairments:  Body Structure / Function / Physical Skills, Psychosocial Skills, Cognitive Skills  Visit Diagnosis: Muscle weakness (generalized)  Other lack of coordination    Problem List Patient Active Problem List   Diagnosis Date Noted  . Stroke (cerebrum) ([place]) 06/25/2018  . CVA (cerebral vascular accident) ([place]) 06/24/2018  . Anxiety 10/08/2015  . Symptomatic bradycardia 10/08/2015  . CAD (coronary artery disease) 04/16/2015  . Chronic constipation 04/16/2015  .  HTN (hypertension) 04/16/2015  . Arthritis, degenerative 04/16/2015  . Allergic rhinitis, seasonal 04/16/2015  . Avitaminosis D 04/16/2015  . Depression, major, recurrent, moderate ([place]) 04/15/2015  . Atypical chest pain 06/26/2013  . Brash 06/26/2013  . Arthritis 06/26/2013  . Asthma, intermittent 01/17/2013  . Airway hyperreactivity 07/20/2011  . GERD (gastroesophageal reflux disease) 07/20/2011  . HCD (hypertensive cardiovascular disease) 07/20/2011   Eman Rynders T Tomasita Morrow, OTR/L, CLT  Lisa-Marie Rueger 02/04/2019, 10:05 AM  [place] MAIN [facility] SERVICES [address] [place], [place], [address] Phone: [phone]   Fax:  [phone]  Name: Carly Marks MRN: [id number] Date of Birth: 11-05-1956

## 2019-02-06 ENCOUNTER — Ambulatory Visit: Payer: Medicare Other

## 2019-02-06 ENCOUNTER — Encounter: Payer: Medicare Other | Admitting: Occupational Therapy

## 2019-02-08 ENCOUNTER — Encounter: Payer: Medicare Other | Admitting: Occupational Therapy

## 2019-02-08 ENCOUNTER — Ambulatory Visit: Payer: Medicare Other | Admitting: Physical Therapy

## 2019-02-09 ENCOUNTER — Encounter: Payer: Medicare Other | Admitting: Occupational Therapy

## 2019-02-17 ENCOUNTER — Encounter: Payer: Self-pay | Admitting: Occupational Therapy

## 2019-02-17 NOTE — Therapy (Signed)
[place] MAIN [facility] SERVICES [address] [place], [place], [address] Phone: [phone]   Fax:  [phone]  Patient Details  Name: Carly Marks MRN: [id number] Date of Birth: Nov 20, 1955 Referring Provider:  No ref. provider found  Encounter Date: 02/17/2019  Dr. Quay Burow , Carly Marks was referred for outpatient Occupational Therapy services.  Due to COVID-19, it will be more appropriate at this time to refer for home health therapy services until our outpatient clinic reopen.   Thank you,  Harrel Carina, MS, OTR/L  Harrel Carina 02/17/2019, 3:02 PM  [place] MAIN [facility] SERVICES [address] [place], [place], [address] Phone: [phone]   Fax:  [phone]

## 2019-02-17 NOTE — Therapy (Signed)
[place] MAIN [facility] SERVICES [address] [place], [place], [address] Phone: [phone]   Fax:  [phone]  Patient Details  Name: Carly Marks MRN: [id number] Date of Birth: 11/05/1956 Referring Provider:  No ref. provider found  Encounter Date: 02/17/2019  The [facility] outpatient clinics are closed at this time due to the COVID-19 epidemic. Upon consideration of patient's status and needs, the referring physician is being contacted to request a home health referral in order to help provide continued rehabilitation services care for the patient.   Harrel Carina, MS, OTR/L 02/17/2019, 3:00 PM  [place] MAIN [facility] SERVICES [address] [place], [place], [address] Phone: [phone]   Fax:  [phone]

## 2019-04-24 DIAGNOSIS — F0390 Unspecified dementia without behavioral disturbance: Secondary | ICD-10-CM | POA: Insufficient documentation

## 2019-05-02 ENCOUNTER — Encounter: Payer: Medicare Other | Admitting: Physical Therapy

## 2019-06-23 DIAGNOSIS — I63532 Cerebral infarction due to unspecified occlusion or stenosis of left posterior cerebral artery: Secondary | ICD-10-CM | POA: Insufficient documentation

## 2019-11-06 ENCOUNTER — Encounter: Payer: Self-pay | Admitting: Urology

## 2019-11-06 ENCOUNTER — Other Ambulatory Visit: Payer: Self-pay

## 2019-11-06 ENCOUNTER — Ambulatory Visit (INDEPENDENT_AMBULATORY_CARE_PROVIDER_SITE_OTHER): Payer: Medicare Other | Admitting: Urology

## 2019-11-06 VITALS — BP 130/83 | HR 55 | Ht 65.0 in | Wt 227.0 lb

## 2019-11-06 DIAGNOSIS — N3946 Mixed incontinence: Secondary | ICD-10-CM | POA: Diagnosis not present

## 2019-11-06 MED ORDER — MIRABEGRON ER 50 MG PO TB24: 50.0000 mg | ORAL_TABLET | Freq: Every day | ORAL | 11 refills | Status: DC

## 2019-11-06 NOTE — Addendum Note (Signed)
Addended by: Verlene Mayer A on: 11/06/2019 02:40 PM   Modules accepted: Orders

## 2019-11-06 NOTE — Addendum Note (Signed)
Addended by: Verlene Mayer A on: 11/06/2019 01:38 PM   Modules accepted: Orders

## 2019-11-06 NOTE — Progress Notes (Signed)
11/06/2019 1:15 PM   JERMAINE HAFEN May 26, 1956 [id number]  Referring provider: Ellamae Sia, MD [address] [place],  [place] [address]  Chief Complaint  Patient presents with  . Over Active Bladder    HPI: I was consulted to assess the patient frequency.  For the last several months or so she said frequency and some burning.  Worse in the last 4 weeks.  Her history was more difficult.  She can void every 1 hour and cannot hold it for 2 hours.  She does leak with coughing sneezing but not bending lifting.  She has urge incontinence.  She wears 1 pad a day mildly wet.  She has bedwetting with mild severity.  She gets infrequent bladder infections  She denies history of kidney stones previous to surgery and hysterectomy  Modifying factors: There are no other modifying factors  Associated signs and symptoms: There are no other associated signs and symptoms Aggravating and relieving factors: There are no other aggravating or relieving factors Severity: Moderate Duration: Persistent   PMH: Past Medical History:  Diagnosis Date  . Anemia   . Anxiety   . Arthritis   . Asthma   . CAD (coronary artery disease)    per patient  . Depression   . Family history of colonic polyps   . GERD (gastroesophageal reflux disease)   . HA (headache)   . HTN (hypertension)   . Major depressive disorder, recurrent episode, moderate ([place])   . Obstructive sleep apnea     Surgical History: Past Surgical History:  Procedure Laterality Date  . CARDIAC CATHETERIZATION  02/19/15, 06/2009, 08/2011  . COLONOSCOPY N/A 04/22/2015   Procedure: COLONOSCOPY;  Surgeon: Hulen Luster, MD;  Location: [facility] ENDOSCOPY;  Service: Gastroenterology;  Laterality: N/A;  . TUBAL LIGATION      Home Medications:  Allergies as of 11/06/2019      Reactions   Codeine    Dextroamphetamine    Nsaids    Morphine And Related Other (See Comments)   palpitations    Penicillins Other (See Comments)   Palpitations  Has patient had a PCN reaction causing immediate rash, facial/tongue/throat swelling, SOB or lightheadedness with hypotension: Yes Has patient had a PCN reaction causing severe rash involving mucus membranes or skin necrosis: No Has patient had a PCN reaction that required hospitalization: No Has patient had a PCN reaction occurring within the last 10 years: No If all of the above answers are "NO", then may proceed with Cephalosporin use.   Tolmetin       Medication List       Accurate as of November 06, 2019  1:15 PM. If you have any questions, ask your nurse or doctor.        acetaminophen 500 MG tablet Commonly known as: TYLENOL Take 1,000 mg by mouth every 4 (four) hours as needed for mild pain or moderate pain.   amLODipine-benazepril 10-20 MG capsule Commonly known as: LOTREL Take 1 capsule by mouth daily.   aspirin 81 MG tablet Take 81 mg by mouth daily.   atorvastatin 40 MG tablet Commonly known as: LIPITOR Take 40 mg by mouth daily at 6 PM.   cholecalciferol 10 MCG (400 UNIT) Tabs tablet Commonly known as: VITAMIN D3 Take 400 Units by mouth daily.   clopidogrel 75 MG tablet Commonly known as: PLAVIX Take 75 mg by mouth daily.   divalproex 250 MG DR tablet Commonly known as: DEPAKOTE Take 3 tablets (750 mg total)  by mouth 2 (two) times daily.   FLUoxetine 20 MG capsule Commonly known as: PROZAC Take 3 capsules (60 mg total) by mouth daily.   fluticasone 50 MCG/ACT nasal spray Commonly known as: FLONASE Place 2 sprays into the nose daily.   isosorbide dinitrate 30 MG tablet Commonly known as: ISORDIL Take 30 mg by mouth daily.   LORazepam 1 MG tablet Commonly known as: ATIVAN Take 1 tablet (1 mg total) by mouth 2 (two) times daily.   Melatonin 3 MG Tabs Take 1 tablet (3 mg total) by mouth at bedtime.   metoprolol succinate 25 MG 24 hr tablet Commonly known as: TOPROL-XL Take 1 tablet (25 mg total) by mouth at bedtime. What changed:  when to take this   OLANZapine 7.5 MG tablet Commonly known as: ZYPREXA Take 7.5 mg by mouth at bedtime.   omeprazole 20 MG capsule Commonly known as: PRILOSEC Take 20 mg by mouth daily.   traZODone 100 MG tablet Commonly known as: DESYREL Take 1 tablet (100 mg total) by mouth at bedtime as needed for sleep. Take 1 or 1-1/2 tablets at bedtime as needed for insomnia What changed:   how much to take  additional instructions       Allergies:  Allergies  Allergen Reactions  . Codeine   . Dextroamphetamine   . Nsaids   . Morphine And Related Other (See Comments)    palpitations   . Penicillins Other (See Comments)    Palpitations  Has patient had a PCN reaction causing immediate rash, facial/tongue/throat swelling, SOB or lightheadedness with hypotension: Yes Has patient had a PCN reaction causing severe rash involving mucus membranes or skin necrosis: No Has patient had a PCN reaction that required hospitalization: No Has patient had a PCN reaction occurring within the last 10 years: No If all of the above answers are "NO", then may proceed with Cephalosporin use.   . Tolmetin     Family History: Family History  Problem Relation Age of Onset  . Hypertension Mother   . Diabetes Maternal Aunt   . Diabetes Maternal Uncle   . Hypertension Sister   . Breast cancer Sister 106  . Colon cancer Maternal Grandmother   . Hypertension Brother   . Depression Other   . Colon polyps Other     Social History:  reports that she has never smoked. She has never used smokeless tobacco. She reports that she does not drink alcohol or use drugs.  ROS: UROLOGY Frequent Urination?: Yes Hard to postpone urination?: No Burning/pain with urination?: Yes Get up at night to urinate?: Yes Leakage of urine?: No Urine stream starts and stops?: No Trouble starting stream?: No Do you have to strain to urinate?: No Blood in urine?: No Urinary tract infection?: Yes Sexually transmitted  disease?: No Injury to kidneys or bladder?: No Painful intercourse?: No Weak stream?: No Currently pregnant?: No Vaginal bleeding?: No Last menstrual period?: N  Gastrointestinal Nausea?: No Vomiting?: No Indigestion/heartburn?: No Diarrhea?: No Constipation?: No  Constitutional Fever: No Night sweats?: No Weight loss?: No Fatigue?: No  Skin Skin rash/lesions?: No Itching?: No  Eyes Blurred vision?: No Double vision?: No  Ears/Nose/Throat Sore throat?: No Sinus problems?: No  Hematologic/Lymphatic Swollen glands?: No Easy bruising?: No  Cardiovascular Leg swelling?: No Chest pain?: No  Respiratory Cough?: No Shortness of breath?: No  Endocrine Excessive thirst?: No  Musculoskeletal Back pain?: No Joint pain?: No  Neurological Headaches?: No Dizziness?: No  Psychologic Depression?: No Anxiety?: No  Physical  Exam: BP 130/83   Pulse (!) 55   Ht 5\' 5"  (1.651 m)   Wt 103 kg   BMI 37.77 kg/m   Constitutional:  Alert and oriented, No acute distress. HEENT: [place] AT, moist mucus membranes.  Trachea midline, no masses. Cardiovascular: No clubbing, cyanosis, or edema. Respiratory: Normal respiratory effort, no increased work of breathing. GI: Abdomen is soft, nontender, nondistended, no abdominal masses GU: Mild grade 2 hypermobility the bladder neck.  Minimal atrophy.  No prolapse.  No vaginitis Skin: No rashes, bruises or suspicious lesions. Lymph: No cervical or inguinal adenopathy. Neurologic: Grossly intact, no focal deficits, moving all 4 extremities. Psychiatric: Normal mood and affect.  Laboratory Data: Lab Results  Component Value Date   WBC 8.2 01/01/2019   HGB 12.6 01/01/2019   HCT 38.4 01/01/2019   MCV 77.0 (L) 01/01/2019   PLT 156 01/01/2019    Lab Results  Component Value Date   CREATININE 1.25 (H) 01/01/2019    No results found for: PSA  No results found for: TESTOSTERONE  Lab Results  Component Value Date   HGBA1C  4.9 06/25/2018    Urinalysis    Component Value Date/Time   COLORURINE AMBER (A) 01/01/2019 1317   APPEARANCEUR CLOUDY (A) 01/01/2019 1317   APPEARANCEUR Cloudy 10/24/2014 1544   LABSPEC 1.017 01/01/2019 1317   LABSPEC 1.014 10/24/2014 1544   PHURINE 5.0 01/01/2019 1317   GLUCOSEU NEGATIVE 01/01/2019 1317   GLUCOSEU Negative 10/24/2014 1544   HGBUR LARGE (A) 01/01/2019 1317   BILIRUBINUR NEGATIVE 01/01/2019 1317   BILIRUBINUR Negative 10/24/2014 [place] 01/01/2019 1317   PROTEINUR 100 (A) 01/01/2019 1317   NITRITE NEGATIVE 01/01/2019 1317   LEUKOCYTESUR MODERATE (A) 01/01/2019 1317   LEUKOCYTESUR 3+ 10/24/2014 1544    Pertinent Imaging:   Assessment & Plan: Patient has mixed incontinence but in the last month is going more frequently with a little bit of burning.  Urine looked normal but sent for culture.  I thought was reasonable to try her on an overactive bladder medication The overactive bladder medication may also help her milder bedwetting and next incontinence which did not appear to be the primary complaint.  She has never smoked.  No blood in the urine.  I have not yet recommended cystoscopy.  We were teasing her about the [facility] locally Myrbetriq samples and prescription given  There are no diagnoses linked to this encounter.  No follow-ups on file.  Reece Packer, MD  [facility] [address] [place], [place] [address] [phone]

## 2019-11-07 LAB — MICROSCOPIC EXAMINATION
Bacteria, UA: NONE SEEN
RBC, Urine: NONE SEEN /hpf (ref 0–2)

## 2019-11-07 LAB — URINALYSIS, COMPLETE
Bilirubin, UA: NEGATIVE
Glucose, UA: NEGATIVE
Ketones, UA: NEGATIVE
Nitrite, UA: NEGATIVE
Protein,UA: NEGATIVE
RBC, UA: NEGATIVE
Specific Gravity, UA: 1.02 (ref 1.005–1.030)
Urobilinogen, Ur: >8 mg/dL — ABNORMAL HIGH (ref 0.2–1.0)
pH, UA: 7 (ref 5.0–7.5)

## 2019-11-14 ENCOUNTER — Other Ambulatory Visit: Payer: Self-pay | Admitting: Internal Medicine

## 2019-11-14 DIAGNOSIS — Z1231 Encounter for screening mammogram for malignant neoplasm of breast: Secondary | ICD-10-CM

## 2019-12-11 ENCOUNTER — Telehealth: Payer: Self-pay | Admitting: Urology

## 2019-12-11 NOTE — Telephone Encounter (Signed)
Pt would like a refill for Myrbetriq sent to [facility].

## 2019-12-11 NOTE — Telephone Encounter (Signed)
Script was sent to pharmacy in 12/20 with 11 refills contacted pharmacy to confirm that the script had been received

## 2019-12-18 ENCOUNTER — Ambulatory Visit: Payer: Medicare Other | Admitting: Urology

## 2020-01-01 ENCOUNTER — Encounter: Payer: Self-pay | Admitting: Urology

## 2020-01-01 ENCOUNTER — Ambulatory Visit: Payer: Medicare Other | Admitting: Urology

## 2020-01-23 ENCOUNTER — Ambulatory Visit
Admission: RE | Admit: 2020-01-23 | Discharge: 2020-01-23 | Disposition: A | Discharge status: Home / Self Care | Payer: Medicare Other | Source: Ambulatory Visit | Attending: Internal Medicine | Admitting: Internal Medicine

## 2020-01-23 DIAGNOSIS — Z1231 Encounter for screening mammogram for malignant neoplasm of breast: Secondary | ICD-10-CM | POA: Insufficient documentation

## 2020-05-12 IMAGING — MG DIGITAL SCREENING BILATERAL MAMMOGRAM WITH TOMO AND CAD
8 of 14 series · 8 of 40 positions shown · non-contrast
Comparison: Previous exam(s).

CLINICAL DATA: Screening.

EXAM:
DIGITAL SCREENING BILATERAL MAMMOGRAM WITH TOMO AND CAD

[R XCCL synth-2D]
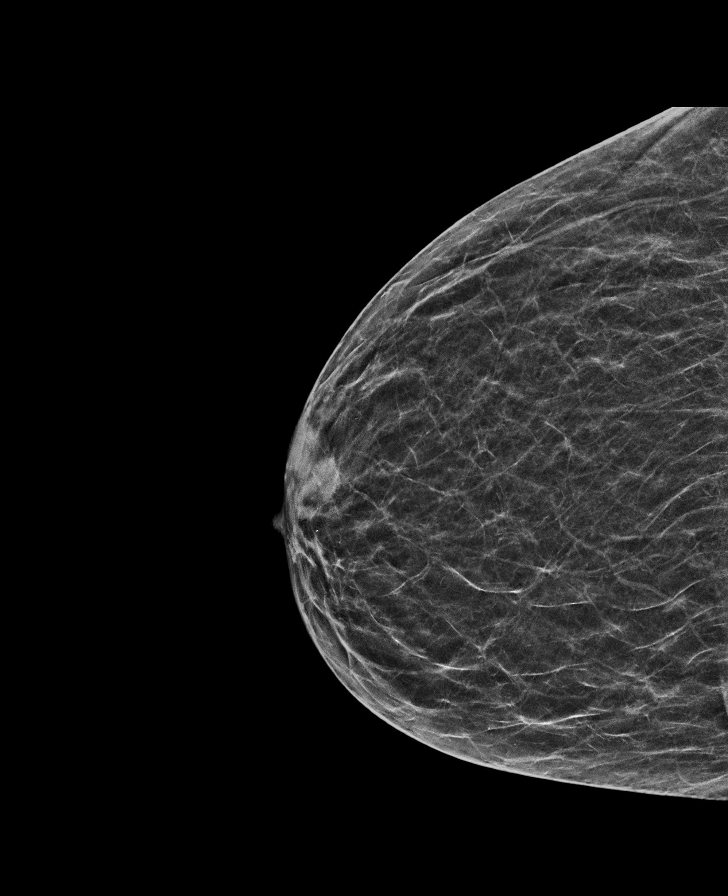

[L XCCL synth-2D]
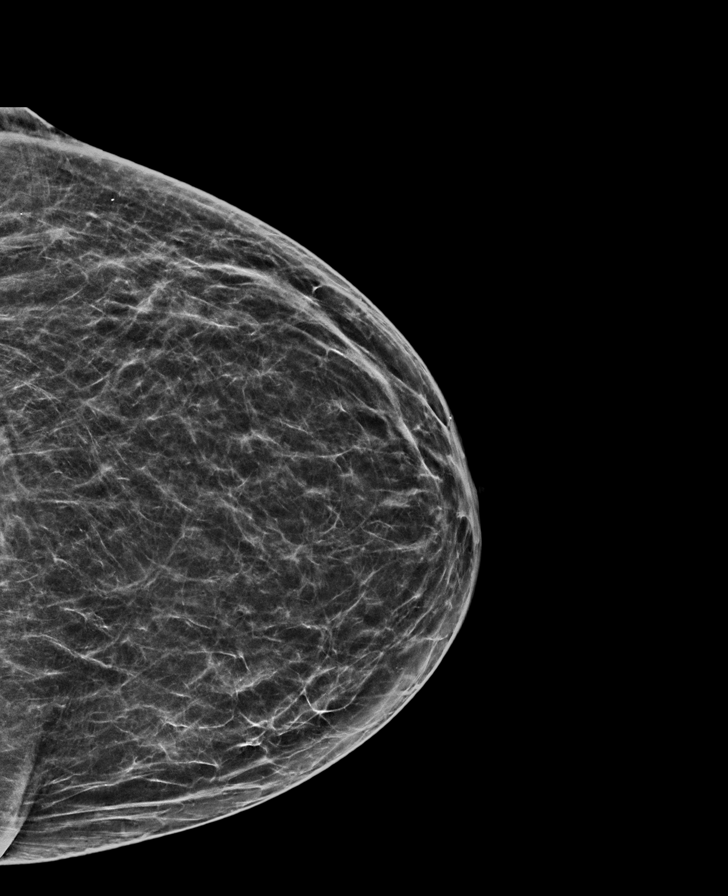

[R CC synth-2D]
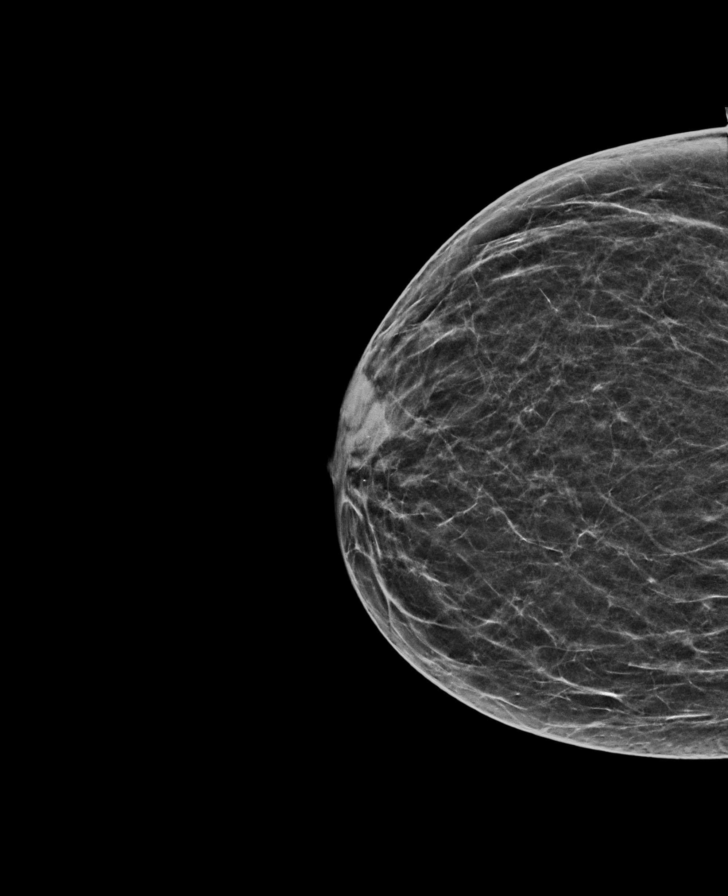

[L CC synth-2D]
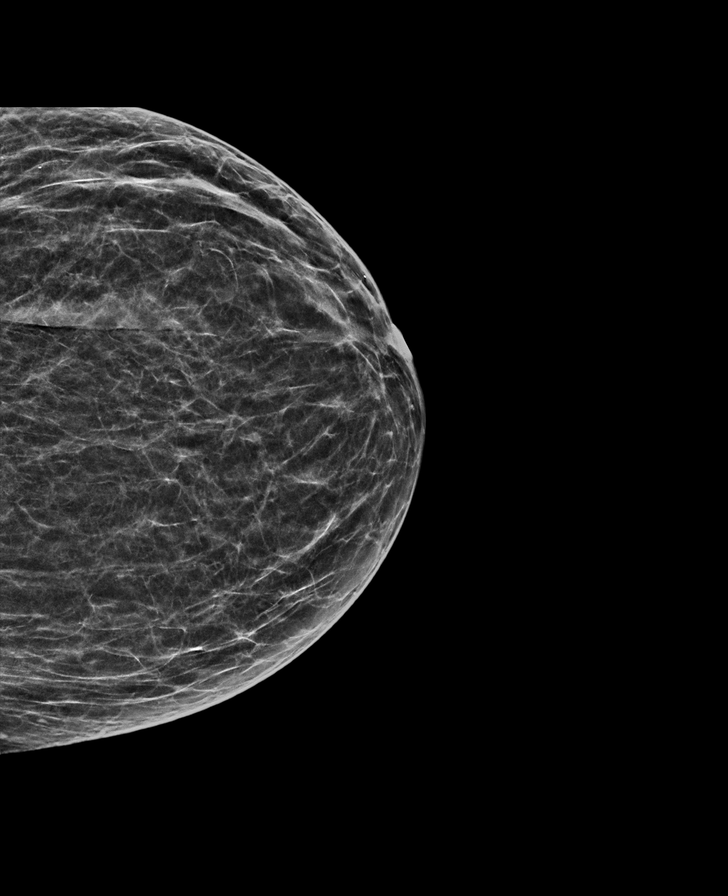

[L MLO synth-2D]
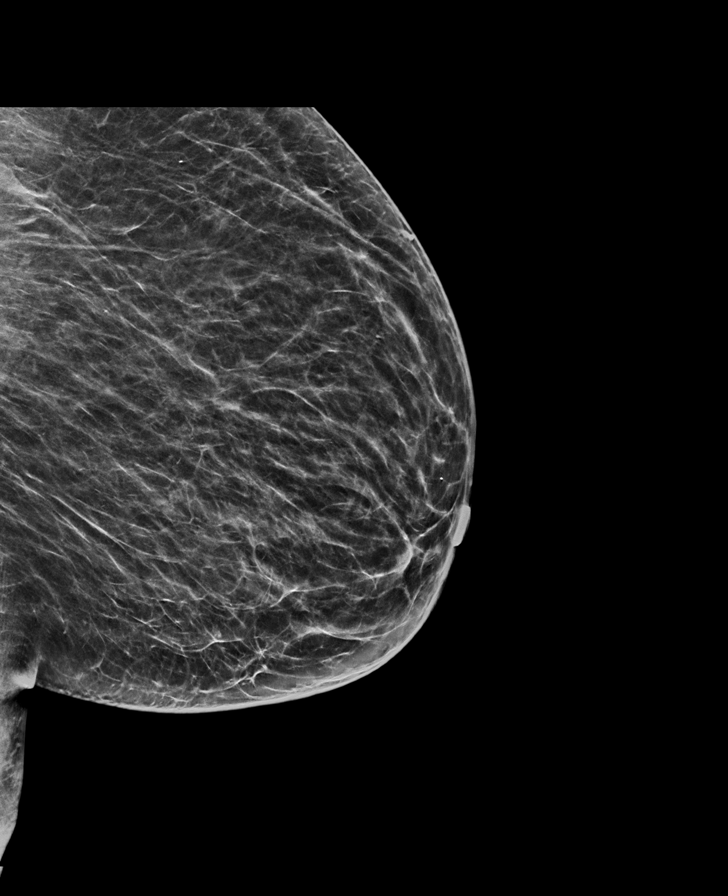

[R MLO synth-2D (1 of 2)]
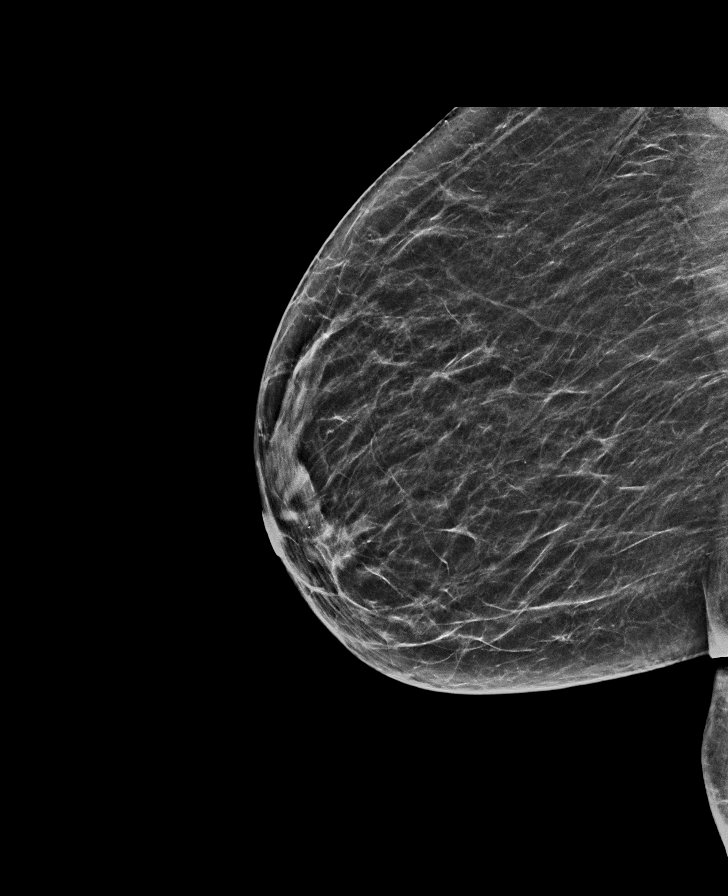

[R MLO synth-2D (2 of 2)]
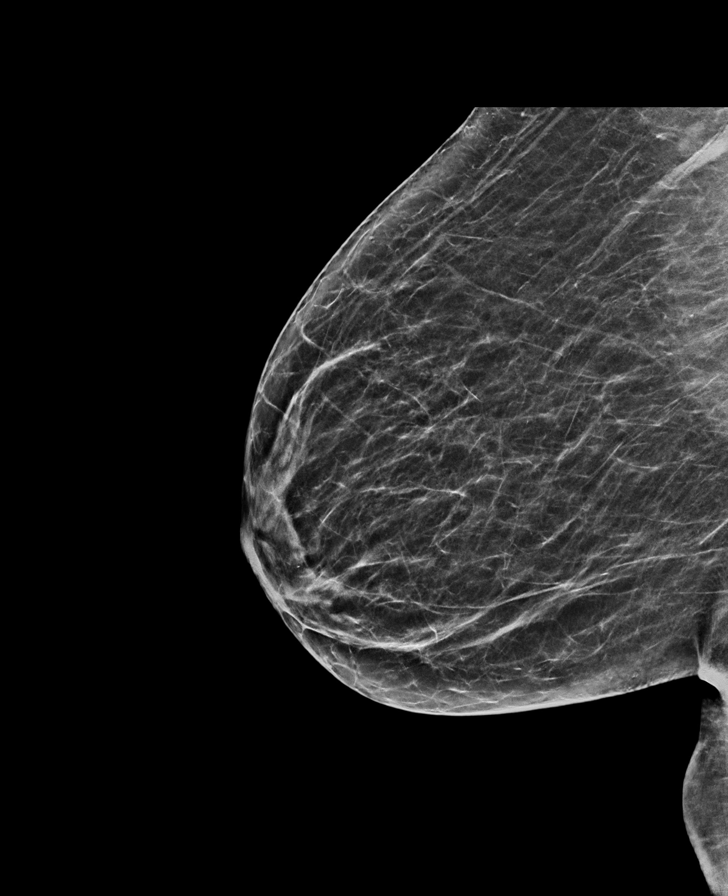

[R CC tomo · tomo slice 25/50.0]
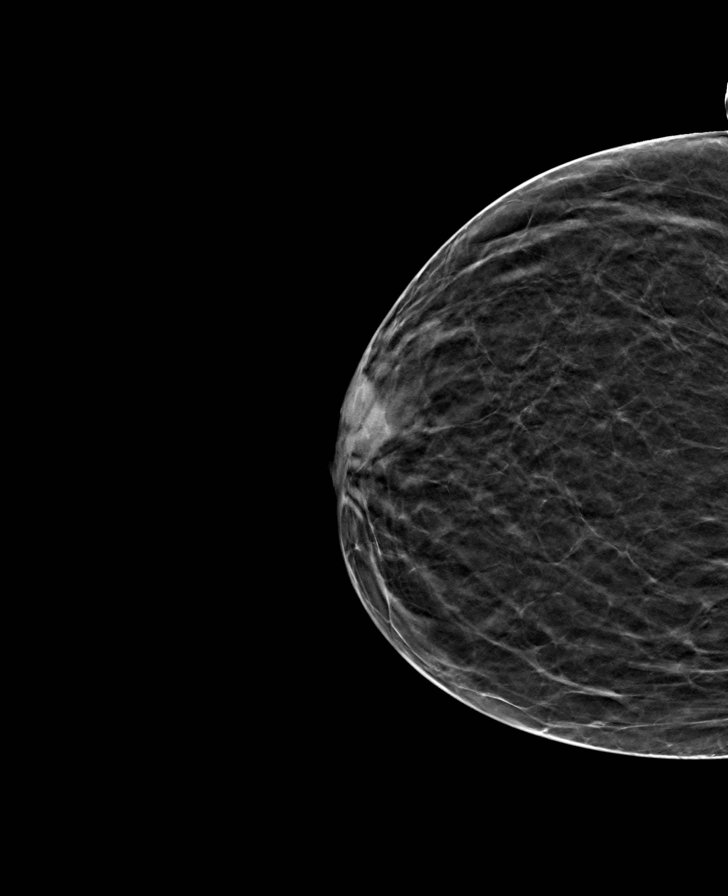

[8 of 40 positions shown; findings below may reference images not displayed]

ACR Breast Density Category b: There are scattered areas of
fibroglandular density.
FINDINGS: There are no findings suspicious for malignancy. Images were
processed with CAD.
IMPRESSION: No mammographic evidence of malignancy. A result letter of this
screening mammogram will be mailed directly to the patient.

RECOMMENDATION:
Screening mammogram in one year. (Code:[id number])

BI-RADS CATEGORY  1: Negative.

## 2020-06-06 ENCOUNTER — Other Ambulatory Visit: Payer: Self-pay

## 2020-06-06 ENCOUNTER — Emergency Department
Admission: EM | Admit: 2020-06-06 | Discharge: 2020-06-06 | Disposition: A | Discharge status: Home / Self Care | Payer: Medicare Other | Attending: Emergency Medicine | Admitting: Emergency Medicine

## 2020-06-06 ENCOUNTER — Emergency Department: Payer: Medicare Other

## 2020-06-06 DIAGNOSIS — Z79899 Other long term (current) drug therapy: Secondary | ICD-10-CM | POA: Diagnosis not present

## 2020-06-06 DIAGNOSIS — N3090 Cystitis, unspecified without hematuria: Secondary | ICD-10-CM | POA: Diagnosis not present

## 2020-06-06 DIAGNOSIS — F0390 Unspecified dementia without behavioral disturbance: Secondary | ICD-10-CM | POA: Diagnosis not present

## 2020-06-06 DIAGNOSIS — Z7982 Long term (current) use of aspirin: Secondary | ICD-10-CM | POA: Diagnosis not present

## 2020-06-06 DIAGNOSIS — G4733 Obstructive sleep apnea (adult) (pediatric): Secondary | ICD-10-CM | POA: Insufficient documentation

## 2020-06-06 DIAGNOSIS — I2510 Atherosclerotic heart disease of native coronary artery without angina pectoris: Secondary | ICD-10-CM | POA: Insufficient documentation

## 2020-06-06 DIAGNOSIS — R5383 Other fatigue: Secondary | ICD-10-CM | POA: Insufficient documentation

## 2020-06-06 DIAGNOSIS — I10 Essential (primary) hypertension: Secondary | ICD-10-CM | POA: Insufficient documentation

## 2020-06-06 DIAGNOSIS — R05 Cough: Secondary | ICD-10-CM | POA: Diagnosis present

## 2020-06-06 DIAGNOSIS — K219 Gastro-esophageal reflux disease without esophagitis: Secondary | ICD-10-CM | POA: Diagnosis not present

## 2020-06-06 DIAGNOSIS — R059 Cough, unspecified: Secondary | ICD-10-CM

## 2020-06-06 DIAGNOSIS — U071 COVID-19: Secondary | ICD-10-CM | POA: Insufficient documentation

## 2020-06-06 DIAGNOSIS — Z20822 Contact with and (suspected) exposure to covid-19: Secondary | ICD-10-CM

## 2020-06-06 DIAGNOSIS — Z885 Allergy status to narcotic agent status: Secondary | ICD-10-CM | POA: Diagnosis not present

## 2020-06-06 DIAGNOSIS — Z8673 Personal history of transient ischemic attack (TIA), and cerebral infarction without residual deficits: Secondary | ICD-10-CM | POA: Diagnosis not present

## 2020-06-06 DIAGNOSIS — Z886 Allergy status to analgesic agent status: Secondary | ICD-10-CM | POA: Diagnosis not present

## 2020-06-06 DIAGNOSIS — Z8249 Family history of ischemic heart disease and other diseases of the circulatory system: Secondary | ICD-10-CM | POA: Diagnosis not present

## 2020-06-06 DIAGNOSIS — E559 Vitamin D deficiency, unspecified: Secondary | ICD-10-CM | POA: Insufficient documentation

## 2020-06-06 DIAGNOSIS — Z880 Allergy status to penicillin: Secondary | ICD-10-CM | POA: Diagnosis not present

## 2020-06-06 DIAGNOSIS — J45909 Unspecified asthma, uncomplicated: Secondary | ICD-10-CM | POA: Diagnosis not present

## 2020-06-06 LAB — URINALYSIS, COMPLETE (UACMP) WITH MICROSCOPIC
Bilirubin Urine: NEGATIVE
Glucose, UA: NEGATIVE mg/dL
Ketones, ur: NEGATIVE mg/dL
Nitrite: NEGATIVE
Protein, ur: 30 mg/dL — AB
RBC / HPF: >50 RBC/hpf — ABNORMAL HIGH (ref 0–5)
Specific Gravity, Urine: 1.014 (ref 1.005–1.030)
WBC, UA: >50 WBC/hpf — ABNORMAL HIGH (ref 0–5)
pH: 6 (ref 5.0–8.0)

## 2020-06-06 LAB — SARS CORONAVIRUS 2 BY RT PCR (HOSPITAL ORDER, PERFORMED IN ~~LOC~~ HOSPITAL LAB): SARS Coronavirus 2: POSITIVE — AB

## 2020-06-06 MED ORDER — FOSFOMYCIN TROMETHAMINE 3 G PO PACK
3.0000 g | PACK | Freq: Once | ORAL | Status: AC
  Administered 2020-06-06: 3 g via ORAL
  Filled 2020-06-06: qty 3

## 2020-06-06 MED ORDER — ACETAMINOPHEN 500 MG PO TABS
1000.0000 mg | ORAL_TABLET | Freq: Once | ORAL | Status: AC
  Administered 2020-06-06: 1000 mg via ORAL
  Filled 2020-06-06: qty 2

## 2020-06-06 NOTE — ED Notes (Signed)
Pt un able to sign d/c inst.  Hx demetia   inst to family

## 2020-06-06 NOTE — ED Provider Notes (Addendum)
[facility] Emergency Department Provider Note  ____________________________________________   First MD Initiated Contact with Patient 06/06/20 1511     (approximate)  I have reviewed the triage vital signs and the nursing notes.   HISTORY  Chief Complaint Recurrent UTI and Covid Exposure   HPI Carly Marks is a 64 y.o. female with a past medical history of dementia (confirmed per daughter often confused at baseline) anemia, anxiety, arthritis, asthma, CAD, depression, GERD, HTN, recurrent headaches, major depressive disorder, and OSA who presents for assessment of cough that she thinks has been going on for at least 2 to 3 weeks after possible Covid exposure 5 weeks ago as well as low-grade fevers, fatigue, and approximately 1 week of burning with urination and increased urinary frequency.  Patient states her headaches have been intermittent in the last couple of days but similar to prior and she has not been coughing up anything or having any specific pain in her ears, eyes, nose, throat, chest, back, abdomen, or extremities.  She denies any recent falls or injuries or rashes.  Denies any blood in her urine or stool.  States she is taking her medicines as directed.  Denies tobacco abuse, EtOH abuse, or illicit drug use.         Past Medical History:  Diagnosis Date  . Anemia   . Anxiety   . Arthritis   . Asthma   . CAD (coronary artery disease)    per patient  . Depression   . Family history of colonic polyps   . GERD (gastroesophageal reflux disease)   . HA (headache)   . HTN (hypertension)   . Major depressive disorder, recurrent episode, moderate ([place])   . Obstructive sleep apnea     Patient Active Problem List   Diagnosis Date Noted  . 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 CVA (cerebral vascular accident) ([place]) 06/24/2018  . Anxiety 10/08/2015  . Symptomatic bradycardia 10/08/2015  . CAD (coronary artery disease) 04/16/2015  . Chronic  constipation 04/16/2015  . HTN (hypertension) 04/16/2015  . Arthritis, degenerative 04/16/2015  . Allergic rhinitis, seasonal 04/16/2015  . Avitaminosis D 04/16/2015  . Depression, major, recurrent, moderate ([place]) 04/15/2015  . Atypical chest pain 06/26/2013  . Brash 06/26/2013  . Arthritis 06/26/2013  . Asthma, intermittent 01/17/2013  . Airway hyperreactivity 07/20/2011  . GERD (gastroesophageal reflux disease) 07/20/2011  . HCD (hypertensive cardiovascular disease) 07/20/2011    Past Surgical History:  Procedure Laterality Date  . CARDIAC CATHETERIZATION  02/19/15, 06/2009, 08/2011  . COLONOSCOPY N/A 04/22/2015   Procedure: COLONOSCOPY;  Surgeon: Hulen Luster, MD;  Location: [facility] ENDOSCOPY;  Service: Gastroenterology;  Laterality: N/A;  . TUBAL LIGATION      Prior to Admission medications   Medication Sig Start Date End Date Taking? Authorizing Provider  acetaminophen (TYLENOL) 500 MG tablet Take 1,000 mg by mouth every 4 (four) hours as needed for mild pain or moderate pain.     [provider]  amLODipine-benazepril (LOTREL) 10-20 MG capsule Take 1 capsule by mouth daily.     [provider]  aspirin 81 MG tablet Take 81 mg by mouth daily.    [provider]  atorvastatin (LIPITOR) 40 MG tablet Take 40 mg by mouth daily at 6 PM.  04/01/15   [provider]  cholecalciferol (VITAMIN D) 400 UNITS TABS tablet Take 400 Units by mouth daily.     [provider]  clopidogrel (PLAVIX) 75 MG tablet Take 75 mg  by mouth daily.    [provider]  divalproex (DEPAKOTE) 250 MG DR tablet Take 3 tablets (750 mg total) by mouth 2 (two) times daily. 06/25/18   Vaughan Basta, MD  FLUoxetine (PROZAC) 20 MG capsule Take 3 capsules (60 mg total) by mouth daily. 07/29/15   Marjie Skiff, MD  fluticasone (FLONASE) 50 MCG/ACT nasal spray Place 2 sprays into the nose daily.    [provider]  isosorbide dinitrate (ISORDIL) 30 MG  tablet Take 30 mg by mouth daily.     [provider]  LORazepam (ATIVAN) 1 MG tablet Take 1 tablet (1 mg total) by mouth 2 (two) times daily. 09/27/15   Marjie Skiff, MD  Melatonin 3 MG TABS Take 1 tablet (3 mg total) by mouth at bedtime. 07/29/15   Marjie Skiff, MD  metoprolol succinate (TOPROL-XL) 25 MG 24 hr tablet Take 1 tablet (25 mg total) by mouth at bedtime. Patient taking differently: Take 25 mg by mouth daily.  10/10/15   Loletha Grayer, MD  mirabegron ER (MYRBETRIQ) 50 MG TB24 tablet Take 1 tablet (50 mg total) by mouth daily. 11/06/19   MacDiarmid, Nicki Reaper, MD  OLANZapine (ZYPREXA) 7.5 MG tablet Take 7.5 mg by mouth at bedtime.     [provider]  omeprazole (PRILOSEC) 20 MG capsule Take 20 mg by mouth daily.    [provider]  traZODone (DESYREL) 100 MG tablet Take 1 tablet (100 mg total) by mouth at bedtime as needed for sleep. Take 1 or 1-1/2 tablets at bedtime as needed for insomnia Patient taking differently: Take 50 mg by mouth at bedtime as needed for sleep.  07/29/15   Marjie Skiff, MD    Allergies Codeine, Dextroamphetamine, Nsaids, Morphine and related, Penicillins, and Tolmetin  Family History  Problem Relation Age of Onset  . Hypertension Mother   . Diabetes Maternal Aunt   . Diabetes Maternal Uncle   . Hypertension Sister   . Breast cancer Sister 22  . Colon cancer Maternal Grandmother   . Hypertension Brother   . Depression Other   . Colon polyps Other     Social History Social History   Tobacco Use  . Smoking status: Never Smoker  . Smokeless tobacco: Never Used  Substance Use Topics  . Alcohol use: No  . Drug use: No    Review of Systems  Review of Systems  Constitutional: Negative for chills and fever.  HENT: Negative for sore throat.   Eyes: Negative for pain.  Respiratory: Negative for cough and stridor.   Cardiovascular: Negative for chest pain.  Gastrointestinal: Negative for vomiting.   Genitourinary: Positive for dysuria and frequency.  Skin: Negative for rash.  Neurological: Positive for headaches ( intermittent over past several day ). Negative for seizures and loss of consciousness.  Psychiatric/Behavioral: Negative for suicidal ideas.  All other systems reviewed and are negative.     ____________________________________________   PHYSICAL EXAM:  VITAL SIGNS: ED Triage Vitals [06/06/20 1426]  Enc Vitals Group     BP 126/70     Pulse Rate 75     Resp 18     Temp 99.2 F (37.3 C)     Temp Source Oral     SpO2 97 %     Weight 200 lb (90.7 kg)     Height 5\' 7"  (1.702 m)     Head Circumference      Peak Flow      Pain Score 0  Pain Loc      Pain Edu?      Excl. in [place]?    Vitals:   06/06/20 1426  BP: 126/70  Pulse: 75  Resp: 18  Temp: 99.2 F (37.3 C)  SpO2: 97%   Physical Exam Vitals and nursing note reviewed.  Constitutional:      General: She is not in acute distress.    Appearance: She is well-developed.  HENT:     Head: Normocephalic and atraumatic.     Right Ear: External ear normal.     Left Ear: External ear normal.     Nose: Nose normal.  Eyes:     Conjunctiva/sclera: Conjunctivae normal.  Cardiovascular:     Rate and Rhythm: Normal rate and regular rhythm.     Heart sounds: No murmur heard.   Pulmonary:     Effort: Pulmonary effort is normal. No respiratory distress.     Breath sounds: Normal breath sounds.  Abdominal:     Palpations: Abdomen is soft.     Tenderness: There is no abdominal tenderness.  Musculoskeletal:     Cervical back: Neck supple.  Skin:    General: Skin is warm and dry.     Capillary Refill: Capillary refill takes less than 2 seconds.  Neurological:     Mental Status: She is alert. Mental status is at baseline. She is confused.  Psychiatric:        Mood and Affect: Mood normal.     Slightly confused and not oriented but is able to relate her recent symptoms and how she is  feeling. ____________________________________________   LABS (all labs ordered are listed, but only abnormal results are displayed)  Labs Reviewed  URINALYSIS, COMPLETE (UACMP) WITH MICROSCOPIC - Abnormal; Notable for the following components:      Result Value   Color, Urine YELLOW (*)    APPearance TURBID (*)    Hgb urine dipstick LARGE (*)    Protein, ur 30 (*)    Leukocytes,Ua LARGE (*)    RBC / HPF >50 (*)    WBC, UA >50 (*)    Bacteria, UA MANY (*)    All other components within normal limits  URINE CULTURE  SARS CORONAVIRUS 2 BY RT PCR (HOSPITAL ORDER, [facility])   ____________________________________________  ____________________________________________  [facility]  ED MD interpretation: No evidence of pneumonia, edema, effusion, pneumothorax, or other acute intrathoracic process.  Official radiology report(s): DG Chest 2 View  Result Date: 06/06/2020 CLINICAL DATA:  Shortness of breath, productive cough. EXAM: CHEST - 2 VIEW COMPARISON:  November 21, 2016. FINDINGS: Stable cardiomediastinal silhouette. No pneumothorax or pleural effusion is noted. Both lungs are clear. The visualized skeletal structures are unremarkable. IMPRESSION: No active cardiopulmonary disease. Electronically Signed   By: Marijo Conception M.D.   On: 06/06/2020 15:42    ____________________________________________   PROCEDURES  Procedure(s) performed (including Critical Care):  Procedures   ____________________________________________   INITIAL IMPRESSION / ASSESSMENT AND PLAN / ED COURSE        Overall patient's history, exam, and work-up is concerning for likely viral bronchitis and cystitis.  Low suspicion for bacterial pneumonia at this time given no evidence of consolidation on chest x-ray or fever.  Patient's history and exam is otherwise not consistent with ACS, PE, dissection, or other immediately for any intrathoracic process.  With regards to her  dysuria UA is suggestive of urinary tract infection.  Urine culture sent.  Low suspicion for pyelonephritis  given absence of fever or CVA tenderness.  Patient does appear to be at her neurological baseline.  Will treat with one-time dose of fosfomycin.  Patient also given Tylenol for mild headache which is likely secondary to her viral bronchitis and cystitis have a low suspicion for CVA or other immediate life-threatening intracranial pathology this time.  Patient discharge stable condition.  Strict return precautions advised discussed.  Discussed with patient did recommend her follow-up with her PCP in next 5 to 7 days and return immediately to the emergency room should he branch any new or acute worsening of her symptoms.  Also discussed this with her daughter over the phone and my concerns for possible Covid and cystitis.  In addition discussed that while COVID-19 PCR was sent in the ED this would not result while she was here and that she could follow the results of this online or have her PCP call these results in next 3 to 5 days and that she should consider herself infectious until she gets these results.  Medications  acetaminophen (TYLENOL) tablet 1,000 mg (1,000 mg Oral Given 06/06/20 1623)  fosfomycin (MONUROL) packet 3 g (3 g Oral Given 06/06/20 1622)       ____________________________________________   FINAL CLINICAL IMPRESSION(S) / ED DIAGNOSES  Final diagnoses:  Cough  Person under investigation for COVID-19  Cystitis     ED Discharge Orders    None       Note:  This document was prepared using Dragon voice recognition software and may include unintentional dictation errors.   Lucrezia Starch, MD 06/06/20 1600    Lucrezia Starch, MD 06/06/20 [phone]

## 2020-06-06 NOTE — ED Notes (Signed)
Dr Tamala Julian aware that pt tested positive for covid.

## 2020-06-06 NOTE — ED Triage Notes (Signed)
PT arrives via POV. Pt reports dysuria for a "few days" Pt denies fever/chills. Pt also reports she was exposed to covid "5 weeks ago". Pt reports she has been coughing for 3 weeks. Pt in NAD, speaking in complete sentences without difficulty, skin warm and dry.

## 2020-06-06 NOTE — ED Notes (Signed)
Thi RN contacted lab to placed urine culture as an add on. Lab confirms.

## 2020-06-07 ENCOUNTER — Telehealth: Payer: Self-pay | Admitting: Physician Assistant

## 2020-06-07 LAB — URINE CULTURE

## 2020-06-07 NOTE — Telephone Encounter (Signed)
Called to discuss with patient about Covid symptoms and the use of bamlanivimab/etesevimab or casirivimab/imdevimab, a monoclonal antibody infusion for those with mild to moderate Covid symptoms and at a high risk of hospitalization.  Pt is qualified for this infusion at the [facility] center due to HTN and CVD   Message left to call back our hotline [phone]. I also sent a mychart message with more info.   Angelena Form PA-C  [facility]

## 2020-06-09 ENCOUNTER — Other Ambulatory Visit: Payer: Self-pay | Admitting: Physician Assistant

## 2020-06-09 DIAGNOSIS — I2510 Atherosclerotic heart disease of native coronary artery without angina pectoris: Secondary | ICD-10-CM

## 2020-06-09 DIAGNOSIS — U071 COVID-19: Secondary | ICD-10-CM

## 2020-06-09 NOTE — Progress Notes (Signed)
I connected by phone with Carly Marks on 06/09/2020 at 8:24 AM to discuss the potential use of a new treatment for mild to moderate COVID-19 viral infection in non-hospitalized patients.  This patient is a 64 y.o. female that meets the FDA criteria for Emergency Use Authorization of COVID monoclonal antibody casirivimab/imdevimab.  Has a (+) direct SARS-CoV-2 viral test result  Has mild or moderate COVID-19   Is NOT hospitalized due to COVID-19  Is within 10 days of symptom onset  Has at least one of the high risk factor(s) for progression to severe COVID-19 and/or hospitalization as defined in EUA.  Specific high risk criteria : Cardiovascular disease or hypertension   I have spoken and communicated the following to the patient or parent/caregiver regarding COVID monoclonal antibody treatment:  1. FDA has authorized the emergency use for the treatment of mild to moderate COVID-19 in adults and pediatric patients with positive results of direct SARS-CoV-2 viral testing who are 72 years of age and older weighing at least 40 kg, and who are at high risk for progressing to severe COVID-19 and/or hospitalization.  2. The significant known and potential risks and benefits of COVID monoclonal antibody, and the extent to which such potential risks and benefits are unknown.  3. Information on available alternative treatments and the risks and benefits of those alternatives, including clinical trials.  4. Patients treated with COVID monoclonal antibody should continue to self-isolate and use infection control measures (e.g., wear mask, isolate, social distance, avoid sharing personal items, clean and disinfect "high touch" surfaces, and frequent handwashing) according to CDC guidelines.   5. The patient or parent/caregiver has the option to accept or refuse COVID monoclonal antibody treatment.  After reviewing this information with the patient, The patient agreed to proceed with receiving  casirivimab\imdevimab infusion and will be provided a copy of the Fact sheet prior to receiving the infusion.  Sx onset 7/28. Set up for infusion on 8/3 @ 10:30am. Directions given to [facility]. Pt is aware that insurance will be charged an infusion fee.   Angelena Form 06/09/2020 8:24 AM

## 2020-06-10 MED ORDER — SODIUM CHLORIDE 0.9 % IV SOLN
Freq: Once | INTRAVENOUS | Status: AC
  Filled 2020-06-10: qty 600

## 2020-06-11 ENCOUNTER — Ambulatory Visit (HOSPITAL_COMMUNITY)
Admission: RE | Admit: 2020-06-11 | Discharge: 2020-06-11 | Disposition: A | Discharge status: Home / Self Care | Payer: Medicare Other | Source: Ambulatory Visit | Attending: Pulmonary Disease | Admitting: Pulmonary Disease

## 2020-06-11 DIAGNOSIS — Z23 Encounter for immunization: Secondary | ICD-10-CM | POA: Insufficient documentation

## 2020-06-11 DIAGNOSIS — I2510 Atherosclerotic heart disease of native coronary artery without angina pectoris: Secondary | ICD-10-CM | POA: Diagnosis not present

## 2020-06-11 DIAGNOSIS — U071 COVID-19: Secondary | ICD-10-CM | POA: Diagnosis present

## 2020-06-11 NOTE — Discharge Instructions (Signed)

## 2020-06-11 NOTE — Progress Notes (Signed)
  Diagnosis: COVID-19  Physician: Dr. Joya Gaskins  Procedure: [facility] Med: casirivimab\imdevimab infusion - Provided patient with casirivimab\imdevimab fact sheet for patients, parents and caregivers prior to infusion.  Complications: No immediate complications noted.  Discharge: Discharged home   [place] 06/11/2020

## 2020-12-13 ENCOUNTER — Other Ambulatory Visit: Payer: Self-pay

## 2020-12-16 ENCOUNTER — Other Ambulatory Visit: Payer: Self-pay

## 2020-12-16 ENCOUNTER — Ambulatory Visit (INDEPENDENT_AMBULATORY_CARE_PROVIDER_SITE_OTHER): Payer: Medicare Other | Admitting: Urology

## 2020-12-16 VITALS — BP 140/76 | HR 70

## 2020-12-16 DIAGNOSIS — N3946 Mixed incontinence: Secondary | ICD-10-CM | POA: Diagnosis not present

## 2020-12-16 MED ORDER — OXYBUTYNIN CHLORIDE ER 10 MG PO TB24: 10.0000 mg | ORAL_TABLET | Freq: Every day | ORAL | 11 refills | Status: DC

## 2020-12-16 MED ORDER — SOLIFENACIN SUCCINATE 5 MG PO TABS: 5.0000 mg | ORAL_TABLET | Freq: Every day | ORAL | 11 refills | Status: DC

## 2020-12-16 NOTE — Addendum Note (Signed)
Addended by: Maryln Gottron on: 12/16/2020 03:42 PM   Modules accepted: Orders

## 2020-12-16 NOTE — Patient Instructions (Signed)
Oxybutnin 10 mg once a day sent to the pharmacy.. If Oxybutnin does not help stop taking medication and fill Vesicare. Follow up in 8 weeks.

## 2020-12-16 NOTE — Progress Notes (Signed)
12/16/2020 3:21 PM   Carly Marks [birth year] [id number]  Referring provider: Ellamae Sia, MD [address] [place],  [place] [address]  No chief complaint on file.   HPI: I was consulted to assess the patient frequency.  For the last several months or so she said frequency and some burning.  Worse in the last 4 weeks.  Her history was more difficult.  She can void every 1 hour and cannot hold it for 2 hours.  She does leak with coughing sneezing but not bending lifting.  She has urge incontinence.  She wears 1 pad a day mildly wet.  She has bedwetting with mild severity.  She gets infrequent bladder infections  Patient has mixed incontinence but in the last month is going more frequently with a little bit of burning.  Urine looked normal but sent for culture.  I thought was reasonable to try her on an overactive bladder medication The overactive bladder medication may also help her milder bedwetting and next incontinence which did not appear to be the primary complaint.  She has never smoked.  No blood in the urine.  I have not yet recommended cystoscopy.  We were teasing her about the barbecue restaurant locally Myrbetriq samples and prescription given  TOday Frequency stable.  Last urine culture negative Last 2 months much more urge incontinence.  Clinically not infected.  PMH: Past Medical History:  Diagnosis Date  . Anemia   . Anxiety   . Arthritis   . Asthma   . CAD (coronary artery disease)    per patient  . Depression   . Family history of colonic polyps   . GERD (gastroesophageal reflux disease)   . HA (headache)   . HTN (hypertension)   . Major depressive disorder, recurrent episode, moderate ([place])   . Obstructive sleep apnea     Surgical History: Past Surgical History:  Procedure Laterality Date  . CARDIAC CATHETERIZATION  02/19/15, 06/2009, 08/2011  . COLONOSCOPY N/A 04/22/2015   Procedure: COLONOSCOPY;  Surgeon: Hulen Luster, MD;  Location: [facility]  ENDOSCOPY;  Service: Gastroenterology;  Laterality: N/A;  . TUBAL LIGATION      Home Medications:  Allergies as of 12/16/2020      Reactions   Codeine    Dextroamphetamine    Nsaids    Morphine And Related Other (See Comments)   palpitations    Penicillins Other (See Comments)   Palpitations  Has patient had a PCN reaction causing immediate rash, facial/tongue/throat swelling, SOB or lightheadedness with hypotension: Yes Has patient had a PCN reaction causing severe rash involving mucus membranes or skin necrosis: No Has patient had a PCN reaction that required hospitalization: No Has patient had a PCN reaction occurring within the last 10 years: No If all of the above answers are "NO", then may proceed with Cephalosporin use.   Tolmetin       Medication List       Accurate as of December 16, 2020  3:21 PM. If you have any questions, ask your nurse or doctor.        acetaminophen 500 MG tablet Commonly known as: TYLENOL Take 1,000 mg by mouth every 4 (four) hours as needed for mild pain or moderate pain.   amLODipine-benazepril 10-20 MG capsule Commonly known as: LOTREL Take 1 capsule by mouth daily.   aspirin 81 MG tablet Take 81 mg by mouth daily.   atorvastatin 40 MG tablet Commonly known as: LIPITOR Take 40 mg  by mouth daily at 6 PM.   cholecalciferol 10 MCG (400 UNIT) Tabs tablet Commonly known as: VITAMIN D3 Take 400 Units by mouth daily.   clopidogrel 75 MG tablet Commonly known as: PLAVIX Take 75 mg by mouth daily.   divalproex 250 MG DR tablet Commonly known as: DEPAKOTE Take 3 tablets (750 mg total) by mouth 2 (two) times daily.   FLUoxetine 20 MG capsule Commonly known as: PROZAC Take 3 capsules (60 mg total) by mouth daily.   fluticasone 50 MCG/ACT nasal spray Commonly known as: FLONASE Place 2 sprays into the nose daily.   isosorbide dinitrate 30 MG tablet Commonly known as: ISORDIL Take 30 mg by mouth daily.   LORazepam 1 MG  tablet Commonly known as: ATIVAN Take 1 tablet (1 mg total) by mouth 2 (two) times daily.   melatonin 3 MG Tabs tablet Take 1 tablet (3 mg total) by mouth at bedtime.   metoprolol succinate 25 MG 24 hr tablet Commonly known as: TOPROL-XL Take 1 tablet (25 mg total) by mouth at bedtime. What changed: when to take this   mirabegron ER 50 MG Tb24 tablet Commonly known as: MYRBETRIQ Take 1 tablet (50 mg total) by mouth daily.   OLANZapine 7.5 MG tablet Commonly known as: ZYPREXA Take 7.5 mg by mouth at bedtime.   omeprazole 20 MG capsule Commonly known as: PRILOSEC Take 20 mg by mouth daily.   traZODone 100 MG tablet Commonly known as: DESYREL Take 1 tablet (100 mg total) by mouth at bedtime as needed for sleep. Take 1 or 1-1/2 tablets at bedtime as needed for insomnia What changed:   how much to take  additional instructions       Allergies:  Allergies  Allergen Reactions  . Codeine   . Dextroamphetamine   . Nsaids   . Morphine And Related Other (See Comments)    palpitations   . Penicillins Other (See Comments)    Palpitations  Has patient had a PCN reaction causing immediate rash, facial/tongue/throat swelling, SOB or lightheadedness with hypotension: Yes Has patient had a PCN reaction causing severe rash involving mucus membranes or skin necrosis: No Has patient had a PCN reaction that required hospitalization: No Has patient had a PCN reaction occurring within the last 10 years: No If all of the above answers are "NO", then may proceed with Cephalosporin use.   . Tolmetin     Family History: Family History  Problem Relation Age of Onset  . Hypertension Mother   . Diabetes Maternal Aunt   . Diabetes Maternal Uncle   . Hypertension Sister   . Breast cancer Sister 25  . Colon cancer Maternal Grandmother   . Hypertension Brother   . Depression Other   . Colon polyps Other     Social History:  reports that she has never smoked. She has never used  smokeless tobacco. She reports that she does not drink alcohol and does not use drugs.  ROS:                                        Physical Exam: There were no vitals taken for this visit.  Constitutional:  Alert and oriented, No acute distress.  Laboratory Data: Lab Results  Component Value Date   WBC 8.2 01/01/2019   HGB 12.6 01/01/2019   HCT 38.4 01/01/2019   MCV 77.0 (L) 01/01/2019   PLT  156 01/01/2019    Lab Results  Component Value Date   CREATININE 1.25 (H) 01/01/2019    No results found for: PSA  No results found for: TESTOSTERONE  Lab Results  Component Value Date   HGBA1C 4.9 06/25/2018    Urinalysis    Component Value Date/Time   COLORURINE YELLOW (A) 06/06/2020 1439   APPEARANCEUR TURBID (A) 06/06/2020 1439   APPEARANCEUR Clear 11/06/2019 1444   LABSPEC 1.014 06/06/2020 1439   LABSPEC 1.014 10/24/2014 1544   PHURINE 6.0 06/06/2020 1439   GLUCOSEU NEGATIVE 06/06/2020 1439   GLUCOSEU Negative 10/24/2014 1544   HGBUR LARGE (A) 06/06/2020 1439   BILIRUBINUR NEGATIVE 06/06/2020 1439   BILIRUBINUR Negative 11/06/2019 1444   BILIRUBINUR Negative 10/24/2014 [place] 06/06/2020 1439   PROTEINUR 30 (A) 06/06/2020 1439   NITRITE NEGATIVE 06/06/2020 1439   LEUKOCYTESUR LARGE (A) 06/06/2020 1439   LEUKOCYTESUR 3+ 10/24/2014 1544    Pertinent Imaging:   Assessment & Plan: Urine sent for culture and call if positive.  I called in oxybutynin ER 10 mg 3x11.  We hand-delivered to Vesicare 5 mg prescription to try 1 month if oxybutynin fails.  We went over instructions with her a few times to be clear.  Reassess in 8 weeks.  There are no diagnoses linked to this encounter.  No follow-ups on file.  Reece Packer, MD  [facility] [address] [place], [place] [address] [phone]

## 2020-12-17 LAB — URINALYSIS, COMPLETE
Bilirubin, UA: NEGATIVE
Glucose, UA: NEGATIVE
Ketones, UA: NEGATIVE
Nitrite, UA: NEGATIVE
Protein,UA: NEGATIVE
RBC, UA: NEGATIVE
Specific Gravity, UA: 1.015 (ref 1.005–1.030)
Urobilinogen, Ur: 1 mg/dL (ref 0.2–1.0)
pH, UA: 6 (ref 5.0–7.5)

## 2020-12-17 LAB — MICROSCOPIC EXAMINATION: Bacteria, UA: NONE SEEN

## 2020-12-18 LAB — CULTURE, URINE COMPREHENSIVE

## 2020-12-23 ENCOUNTER — Other Ambulatory Visit: Payer: Self-pay | Admitting: Internal Medicine

## 2020-12-23 DIAGNOSIS — Z1231 Encounter for screening mammogram for malignant neoplasm of breast: Secondary | ICD-10-CM

## 2021-01-23 ENCOUNTER — Other Ambulatory Visit: Payer: Self-pay

## 2021-01-23 ENCOUNTER — Ambulatory Visit
Admission: RE | Admit: 2021-01-23 | Discharge: 2021-01-23 | Disposition: A | Discharge status: Home / Self Care | Payer: Medicare Other | Source: Ambulatory Visit | Attending: Internal Medicine | Admitting: Internal Medicine

## 2021-01-23 DIAGNOSIS — Z1231 Encounter for screening mammogram for malignant neoplasm of breast: Secondary | ICD-10-CM | POA: Insufficient documentation

## 2021-02-10 ENCOUNTER — Ambulatory Visit: Payer: Medicaid Other | Admitting: Urology

## 2021-02-12 ENCOUNTER — Telehealth: Payer: Self-pay

## 2021-02-12 NOTE — Telephone Encounter (Signed)
Patient lvm stated she needs her instructions for her procedure.  I told her I could put instructions in the mail for her. She wanted to come by the office to pick them up.  She is not scheduled for a colonoscopy at this time.  Called her back to let her know this and she said okay.  Thanks,  [place], [place]

## 2021-05-30 ENCOUNTER — Ambulatory Visit: Admit: 2021-05-30 | Payer: Medicare Other

## 2021-05-30 SURGERY — COLONOSCOPY
Anesthesia: General

## 2021-09-26 ENCOUNTER — Ambulatory Visit: Payer: Medicare Other | Admitting: Anesthesiology

## 2021-09-26 ENCOUNTER — Ambulatory Visit
Admission: RE | Admit: 2021-09-26 | Discharge: 2021-09-26 | Disposition: A | Discharge status: Home / Self Care | Payer: Medicare Other | Attending: Gastroenterology | Admitting: Gastroenterology

## 2021-09-26 ENCOUNTER — Encounter
Admission: RE | Disposition: A | Discharge status: Home / Self Care | Payer: Self-pay | Source: Home / Self Care | Attending: Gastroenterology

## 2021-09-26 ENCOUNTER — Encounter: Payer: Self-pay | Admitting: Gastroenterology

## 2021-09-26 DIAGNOSIS — G4733 Obstructive sleep apnea (adult) (pediatric): Secondary | ICD-10-CM | POA: Insufficient documentation

## 2021-09-26 DIAGNOSIS — K640 First degree hemorrhoids: Secondary | ICD-10-CM | POA: Diagnosis not present

## 2021-09-26 DIAGNOSIS — F419 Anxiety disorder, unspecified: Secondary | ICD-10-CM | POA: Insufficient documentation

## 2021-09-26 DIAGNOSIS — I10 Essential (primary) hypertension: Secondary | ICD-10-CM | POA: Diagnosis not present

## 2021-09-26 DIAGNOSIS — K5909 Other constipation: Secondary | ICD-10-CM | POA: Diagnosis not present

## 2021-09-26 DIAGNOSIS — Z8371 Family history of colonic polyps: Secondary | ICD-10-CM | POA: Diagnosis present

## 2021-09-26 DIAGNOSIS — Z1211 Encounter for screening for malignant neoplasm of colon: Secondary | ICD-10-CM | POA: Diagnosis not present

## 2021-09-26 DIAGNOSIS — F32A Depression, unspecified: Secondary | ICD-10-CM | POA: Insufficient documentation

## 2021-09-26 DIAGNOSIS — K5140 Inflammatory polyps of colon without complications: Secondary | ICD-10-CM | POA: Insufficient documentation

## 2021-09-26 DIAGNOSIS — D649 Anemia, unspecified: Secondary | ICD-10-CM | POA: Diagnosis not present

## 2021-09-26 DIAGNOSIS — I2510 Atherosclerotic heart disease of native coronary artery without angina pectoris: Secondary | ICD-10-CM | POA: Insufficient documentation

## 2021-09-26 DIAGNOSIS — M1990 Unspecified osteoarthritis, unspecified site: Secondary | ICD-10-CM | POA: Insufficient documentation

## 2021-09-26 DIAGNOSIS — K219 Gastro-esophageal reflux disease without esophagitis: Secondary | ICD-10-CM | POA: Diagnosis not present

## 2021-09-26 DIAGNOSIS — K635 Polyp of colon: Secondary | ICD-10-CM | POA: Insufficient documentation

## 2021-09-26 DIAGNOSIS — J45909 Unspecified asthma, uncomplicated: Secondary | ICD-10-CM | POA: Diagnosis not present

## 2021-09-26 HISTORY — PX: COLONOSCOPY WITH PROPOFOL: SHX5780

## 2021-09-26 SURGERY — COLONOSCOPY WITH PROPOFOL
Anesthesia: General

## 2021-09-26 MED ORDER — VASOPRESSIN 20 UNIT/ML IV SOLN
INTRAVENOUS | Status: AC
  Filled 2021-09-26: qty 1

## 2021-09-26 MED ORDER — EPHEDRINE SULFATE 50 MG/ML IJ SOLN
INTRAMUSCULAR | Status: DC | PRN
Start: 2021-09-26 — End: 2021-09-26
  Administered 2021-09-26 (×3): 5 mg via INTRAVENOUS
  Administered 2021-09-26: 10 mg via INTRAVENOUS

## 2021-09-26 MED ORDER — PHENYLEPHRINE HCL (PRESSORS) 10 MG/ML IV SOLN
INTRAVENOUS | Status: DC | PRN
  Administered 2021-09-26 (×2): 160 ug via INTRAVENOUS
  Administered 2021-09-26: 80 ug via INTRAVENOUS
  Administered 2021-09-26: 160 ug via INTRAVENOUS

## 2021-09-26 MED ORDER — VASOPRESSIN 20 UNIT/ML IV SOLN
INTRAVENOUS | Status: DC | PRN
  Administered 2021-09-26: 3 [IU] via INTRAVENOUS
  Administered 2021-09-26: 2 [IU] via INTRAVENOUS

## 2021-09-26 MED ORDER — SODIUM CHLORIDE 0.9 % IV SOLN
INTRAVENOUS | Status: DC
  Administered 2021-09-26: 20 mL/h via INTRAVENOUS

## 2021-09-26 MED ORDER — LIDOCAINE HCL (PF) 2 % IJ SOLN
INTRAMUSCULAR | Status: AC
  Filled 2021-09-26: qty 5

## 2021-09-26 MED ORDER — PROPOFOL 500 MG/50ML IV EMUL
INTRAVENOUS | Status: AC
  Filled 2021-09-26: qty 50

## 2021-09-26 MED ORDER — PROPOFOL 500 MG/50ML IV EMUL
INTRAVENOUS | Status: DC | PRN
  Administered 2021-09-26: 175 ug/kg/min via INTRAVENOUS

## 2021-09-26 MED ORDER — LIDOCAINE HCL (CARDIAC) PF 100 MG/5ML IV SOSY
PREFILLED_SYRINGE | INTRAVENOUS | Status: DC | PRN
  Administered 2021-09-26: 50 mg via INTRAVENOUS

## 2021-09-26 MED ORDER — PROPOFOL 10 MG/ML IV BOLUS
INTRAVENOUS | Status: DC | PRN
  Administered 2021-09-26: 60 mg via INTRAVENOUS

## 2021-09-26 NOTE — Anesthesia Postprocedure Evaluation (Signed)
Anesthesia Post Note  Patient: Carly Marks  Procedure(s) Performed: COLONOSCOPY WITH PROPOFOL  Patient location during evaluation: Endoscopy Anesthesia Type: General Level of consciousness: awake and alert Pain management: pain level controlled Vital Signs Assessment: post-procedure vital signs reviewed and stable Respiratory status: spontaneous breathing, nonlabored ventilation, respiratory function stable and patient connected to nasal cannula oxygen Cardiovascular status: blood pressure returned to baseline and stable Postop Assessment: no apparent nausea or vomiting Anesthetic complications: no   No notable events documented.   Last Vitals:  Vitals:   09/26/21 1143 09/26/21 1150  BP:  110/60  Pulse: 78 (!) 118  Resp: 15 15  Temp:    SpO2: 100% 100%    Last Pain:  Vitals:   09/26/21 1150  TempSrc:   PainSc: 0-No pain                 Margaree Mackintosh

## 2021-09-26 NOTE — H&P (Signed)
Pre-Procedure H&P   Patient ID: Carly Marks is a 65 y.o. female.  Gastroenterology Provider: [name], DO  Referring Provider: Octavia Bruckner, PA PCP: Carly Sia, MD (Inactive)  Date: 09/26/2021  HPI [name] is a 65 y.o. female who presents today for Colonoscopy for colorectal cancer screening; fhx of polyps (mother and brother).  Colonoscopy 04/2015- and 09/2010 normal  Deals with chronic constipation. No diarrhea melena or hematochezia BMI 41.5  No other acute gi complaints.   Past Medical History:  Diagnosis Date   Anemia    Anxiety    Arthritis    Asthma    CAD (coronary artery disease)    per patient   Depression    Family history of colonic polyps    GERD (gastroesophageal reflux disease)    HA (headache)    HTN (hypertension)    Major depressive disorder, recurrent episode, moderate ([place])    Obstructive sleep apnea     Past Surgical History:  Procedure Laterality Date   CARDIAC CATHETERIZATION  02/19/15, 06/2009, 08/2011   COLONOSCOPY N/A 04/22/2015   Procedure: COLONOSCOPY;  Surgeon: Carly Luster, MD;  Location: [facility] ENDOSCOPY;  Service: Gastroenterology;  Laterality: N/A;   TUBAL LIGATION      Family History Mother and brother colon polyps No h/o GI disease or malignancy  Review of Systems  Constitutional:  Negative for activity change, appetite change, fatigue, fever and unexpected weight change.  HENT:  Negative for trouble swallowing and voice change.   Respiratory:  Negative for shortness of breath and wheezing.   Cardiovascular:  Negative for chest pain and palpitations.  Gastrointestinal:  Positive for constipation. Negative for abdominal distention, abdominal pain, anal bleeding, blood in stool, diarrhea, nausea, rectal pain and vomiting.  Musculoskeletal:  Negative for arthralgias and myalgias.  Skin:  Negative for color change and pallor.  Neurological:  Negative for dizziness, syncope and weakness.   Psychiatric/Behavioral:  Negative for confusion.   All other systems reviewed and are negative.   Medications No current facility-administered medications on file prior to encounter.   Current Outpatient Medications on File Prior to Encounter  Medication Sig Dispense Refill   acetaminophen (TYLENOL) 500 MG tablet Take 1,000 mg by mouth every 4 (four) hours as needed for mild pain or moderate pain.      amLODipine-benazepril (LOTREL) 5-10 MG capsule Take 1 capsule by mouth daily.     aspirin 81 MG tablet Take 81 mg by mouth daily.     atorvastatin (LIPITOR) 40 MG tablet Take 40 mg by mouth daily at 6 PM.      benztropine (COGENTIN) 0.5 MG tablet Take 0.5 mg by mouth daily.     cholecalciferol (VITAMIN D) 400 UNITS TABS tablet Take 400 Units by mouth daily.      divalproex (DEPAKOTE) 250 MG DR tablet Take 3 tablets (750 mg total) by mouth 2 (two) times daily. 100 tablet 0   FLUoxetine (PROZAC) 20 MG capsule Take 3 capsules (60 mg total) by mouth daily. 90 capsule 3   fluticasone (FLONASE) 50 MCG/ACT nasal spray Place 2 sprays into the nose daily.     isosorbide dinitrate (ISORDIL) 30 MG tablet Take 30 mg by mouth daily.      LORazepam (ATIVAN) 1 MG tablet Take 1 tablet (1 mg total) by mouth 2 (two) times daily. 60 tablet 2   Melatonin 3 MG TABS Take 1 tablet (3 mg total) by mouth at bedtime. 30 tablet 1  metoprolol succinate (TOPROL-XL) 50 MG 24 hr tablet Take 50 mg by mouth daily.     mirabegron ER (MYRBETRIQ) 50 MG TB24 tablet Take 1 tablet (50 mg total) by mouth daily. 30 tablet 11   OLANZapine (ZYPREXA) 5 MG tablet Take by mouth.     omeprazole (PRILOSEC) 20 MG capsule Take 20 mg by mouth daily.     oxybutynin (DITROPAN-XL) 10 MG 24 hr tablet Take 1 tablet (10 mg total) by mouth daily. 30 tablet 11   pantoprazole (PROTONIX) 20 MG tablet Take 20 mg by mouth daily.     solifenacin (VESICARE) 5 MG tablet Take 1 tablet (5 mg total) by mouth daily. 30 tablet 11   SYMBICORT 80-4.5 MCG/ACT  inhaler Inhale into the lungs.     traZODone (DESYREL) 50 MG tablet      clopidogrel (PLAVIX) 75 MG tablet Take 75 mg by mouth daily. (Patient not taking: Reported on 09/26/2021)      Pertinent medications related to GI and procedure were reviewed by me with the patient prior to the procedure   Current Facility-Administered Medications:    0.9 %  sodium chloride infusion, , Intravenous, Continuous, Carly Helling, DO, Last Rate: 20 mL/hr at 09/26/21 0924, 20 mL/hr at 09/26/21 4540  sodium chloride 20 mL/hr (09/26/21 0924)       Allergies  Allergen Reactions   Codeine    Dextroamphetamine    Nsaids    Morphine And Related Other (See Comments)    palpitations    Penicillins Other (See Comments)    Palpitations  Has patient had a PCN reaction causing immediate rash, facial/tongue/throat swelling, SOB or lightheadedness with hypotension: Yes Has patient had a PCN reaction causing severe rash involving mucus membranes or skin necrosis: No Has patient had a PCN reaction that required hospitalization: No Has patient had a PCN reaction occurring within the last 10 years: No If all of the above answers are "NO", then may proceed with Cephalosporin use.    Tolmetin    Allergies were reviewed by me prior to the procedure  Objective    Vitals:   09/26/21 0902  BP: 126/86  Pulse: 96  Resp: 20  Temp: 97.7 F (36.5 C)  TempSrc: Temporal  SpO2: 98%  Weight: 99.8 kg  Height: 5\' 6"  (1.676 m)    Physical Exam Vitals reviewed.  Constitutional:      General: She is not in acute distress.    Appearance: Normal appearance. She is obese. She is not ill-appearing, toxic-appearing or diaphoretic.  HENT:     Head: Normocephalic and atraumatic.     Nose: Nose normal.     Mouth/Throat:     Mouth: Mucous membranes are moist.     Pharynx: Oropharynx is clear.  Eyes:     General: No scleral icterus.    Extraocular Movements: Extraocular movements intact.  Cardiovascular:      Rate and Rhythm: Normal rate and regular rhythm.     Heart sounds: Normal heart sounds. No murmur heard.   No friction rub. No gallop.  Pulmonary:     Effort: Pulmonary effort is normal. No respiratory distress.     Breath sounds: Normal breath sounds. No wheezing, rhonchi or rales.  Abdominal:     General: Bowel sounds are normal. There is no distension.     Palpations: Abdomen is soft.     Tenderness: There is no abdominal tenderness. There is no guarding or rebound.  Musculoskeletal:     Cervical back:  Neck supple.     Right lower leg: No edema.     Left lower leg: No edema.  Skin:    General: Skin is warm and dry.     Coloration: Skin is not jaundiced or pale.  Neurological:     General: No focal deficit present.     Mental Status: She is alert and oriented to person, place, and time. Mental status is at baseline.  Psychiatric:        Mood and Affect: Mood normal.        Behavior: Behavior normal.        Thought Content: Thought content normal.        Judgment: Judgment normal.     Comments: Flat affect; lip smacking     Assessment:  [name] is a 65 y.o. female  who presents today for Colonoscopy for colorectal cancer screening; fhx of polyps (mother and brother).  Plan:  Colonoscopy with possible intervention today  Colonoscopy with possible biopsy, control of bleeding, polypectomy, and interventions as necessary has been discussed with the patient/patient representative. Informed consent was obtained from the patient/patient representative after explaining the indication, nature, and risks of the procedure including but not limited to death, bleeding, perforation, missed neoplasm/lesions, cardiorespiratory compromise, and reaction to medications. Opportunity for questions was given and appropriate answers were provided. Patient/patient representative has verbalized understanding is amenable to undergoing the procedure.   Carly Helling, DO  [facility]  Gastroenterology  Portions of the record may have been created with voice recognition software. Occasional wrong-word or 'sound-a-like' substitutions may have occurred due to the inherent limitations of voice recognition software.  Read the chart carefully and recognize, using context, where substitutions may have occurred.

## 2021-09-26 NOTE — Anesthesia Preprocedure Evaluation (Signed)
Anesthesia Evaluation  Patient identified by MRN, date of birth, ID band Patient awake  General Assessment Comment:Poor historian  Reviewed: Allergy & Precautions, NPO status , Patient's Chart, lab work & pertinent test results  Airway Mallampati: III  TM Distance: >3 FB Neck ROM: full    Dental  (+) Upper Dentures   Pulmonary neg pulmonary ROS,    Pulmonary exam normal        Cardiovascular hypertension, negative cardio ROS Normal cardiovascular exam     Neuro/Psych negative neurological ROS  negative psych ROS   GI/Hepatic negative GI ROS, Neg liver ROS,   Endo/Other  negative endocrine ROS  Renal/GU negative Renal ROS  negative genitourinary   Musculoskeletal   Abdominal (+) + obese,   Peds  Hematology negative hematology ROS (+)   Anesthesia Other Findings Past Medical History: No date: Anemia No date: Anxiety No date: Arthritis No date: Asthma No date: CAD (coronary artery disease)     Comment:  per patient No date: Depression No date: Family history of colonic polyps No date: GERD (gastroesophageal reflux disease) No date: HA (headache) No date: HTN (hypertension) No date: Major depressive disorder, recurrent episode, moderate ([facility]) No date: Obstructive sleep apnea  Past Surgical History: 02/19/15, 06/2009, 08/2011: CARDIAC CATHETERIZATION 04/22/2015: COLONOSCOPY; N/A     Comment:  Procedure: COLONOSCOPY;  Surgeon: Hulen Luster, MD;                Location: [facility] ENDOSCOPY;  Service: Gastroenterology;                Laterality: N/A; No date: TUBAL LIGATION     Reproductive/Obstetrics negative OB ROS                             Anesthesia Physical Anesthesia Plan  ASA: 3  Anesthesia Plan: General   Post-op Pain Management:    Induction: Intravenous  PONV Risk Score and Plan: Propofol infusion and TIVA  Airway Management Planned: Natural Airway and Nasal  Cannula  Additional Equipment:   Intra-op Plan:   Post-operative Plan:   Informed Consent: I have reviewed the patients History and Physical, chart, labs and discussed the procedure including the risks, benefits and alternatives for the proposed anesthesia with the patient or authorized representative who has indicated his/her understanding and acceptance.     Dental Advisory Given  Plan Discussed with: Anesthesiologist, CRNA and Surgeon  Anesthesia Plan Comments: (Patient consented for risks of anesthesia including but not limited to:  - adverse reactions to medications - risk of airway placement if required - damage to eyes, teeth, lips or other oral mucosa - nerve damage due to positioning  - sore throat or hoarseness - Damage to heart, brain, nerves, lungs, other parts of body or loss of life  Patient voiced understanding.)        Anesthesia Quick Evaluation

## 2021-09-26 NOTE — Anesthesia Procedure Notes (Signed)
Date/Time: 09/26/2021 10:17 AM Performed by: Johnna Acosta, CRNA Pre-anesthesia Checklist: Patient identified, Emergency Drugs available, Suction available, Patient being monitored and Timeout performed Patient Re-evaluated:Patient Re-evaluated prior to induction Oxygen Delivery Method: Supernova nasal CPAP Preoxygenation: Pre-oxygenation with 100% oxygen Induction Type: IV induction

## 2021-09-26 NOTE — Interval H&P Note (Signed)
History and Physical Interval Note: Preprocedure H&P from 09/26/21  was reviewed and there was no interval change after seeing and examining the patient.  Written consent was obtained from the patient after discussion of risks, benefits, and alternatives. Patient has consented to proceed with Colonoscopy with possible intervention   09/26/2021 10:20 AM  Carly Marks  has presented today for surgery, with the diagnosis of FAMILY HX.OF COLON POLYPS.  The various methods of treatment have been discussed with the patient and family. After consideration of risks, benefits and other options for treatment, the patient has consented to  Procedure(s): COLONOSCOPY WITH PROPOFOL (N/A) as a surgical intervention.  The patient's history has been reviewed, patient examined, no change in status, stable for surgery.  I have reviewed the patient's chart and labs.  Questions were answered to the patient's satisfaction.     [name]

## 2021-09-26 NOTE — Transfer of Care (Signed)
Immediate Anesthesia Transfer of Care Note  Patient: Carly Marks  Procedure(s) Performed: COLONOSCOPY WITH PROPOFOL  Patient Location: PACU  Anesthesia Type:General  Level of Consciousness: sedated  Airway & Oxygen Therapy: Patient Spontanous Breathing and supernova  Post-op Assessment: Report given to RN and Post -op Vital signs reviewed and stable  Post vital signs: Reviewed and stable  Last Vitals:  Vitals Value Taken Time  BP 96/55 09/26/21 1112  Temp    Pulse 75 09/26/21 1112  Resp 16 09/26/21 1112  SpO2 100 % 09/26/21 1112    Last Pain:  Vitals:   09/26/21 0902  TempSrc: Temporal  PainSc: 0-No pain         Complications: No notable events documented.

## 2021-09-26 NOTE — Op Note (Signed)
[facility] Gastroenterology Patient Name: Carly Marks Procedure Date: 09/26/2021 10:17 AM MRN: [id number] Account #: [id number] Date of Birth: 1956/04/29 Admit Type: Outpatient Age: 65 Room: [facility] ENDO ROOM 1 Gender: Female Note Status: Finalized Instrument Name: Jasper Riling [id number] Procedure:             Colonoscopy Indications:           Colon cancer screening in patient at increased risk:                         Family history of 1st-degree relative with colon                         polyps, Colon cancer screening in patient at increased                         risk: Family history of colon polyps in multiple                         1st-degree relatives Providers:             [name] DO, DO Medicines:             Monitored Anesthesia Care Complications:         No immediate complications. Estimated blood loss:                         Minimal. Procedure:             Pre-Anesthesia Assessment:                        - Prior to the procedure, a History and Physical was                         performed, and patient medications and allergies were                         reviewed. The patient is competent. The risks and                         benefits of the procedure and the sedation options and                         risks were discussed with the patient. All questions                         were answered and informed consent was obtained.                         Patient identification and proposed procedure were                         verified by the physician, the nurse, the anesthetist                         and the technician in the endoscopy suite. Mental                         Status Examination: alert and oriented.  Airway                         Examination: normal oropharyngeal airway and neck                         mobility. Respiratory Examination: clear to                         auscultation. CV Examination: RRR, no murmurs, no  S3                         or S4. Prophylactic Antibiotics: The patient does not                         require prophylactic antibiotics. Prior                         Anticoagulants: The patient has taken no previous                         anticoagulant or antiplatelet agents. ASA Grade                         Assessment: III - A patient with severe systemic                         disease. After reviewing the risks and benefits, the                         patient was deemed in satisfactory condition to                         undergo the procedure. The anesthesia plan was to use                         monitored anesthesia care (MAC). Immediately prior to                         administration of medications, the patient was                         re-assessed for adequacy to receive sedatives. The                         heart rate, respiratory rate, oxygen saturations,                         blood pressure, adequacy of pulmonary ventilation, and                         response to care were monitored throughout the                         procedure. The physical status of the patient was                         re-assessed after the procedure.  After obtaining informed consent, the colonoscope was                         passed under direct vision. Throughout the procedure,                         the patient's blood pressure, pulse, and oxygen                         saturations were monitored continuously. The                         Colonoscope was introduced through the anus and                         advanced to the the terminal ileum, with                         identification of the appendiceal orifice and IC                         valve. The colonoscopy was performed without                         difficulty. The patient tolerated the procedure well.                         The quality of the bowel preparation was evaluated                          using the BBPS [facility] Bowel Preparation Scale) with                         scores of: Right Colon = 2 (minor amount of residual                         staining, small fragments of stool and/or opaque                         liquid, but mucosa seen well), Transverse Colon = 3                         (entire mucosa seen well with no residual staining,                         small fragments of stool or opaque liquid) and Left                         Colon = 3 (entire mucosa seen well with no residual                         staining, small fragments of stool or opaque liquid).                         The total BBPS score equals 8. The quality of the  bowel preparation was excellent. The terminal ileum,                         ileocecal valve, appendiceal orifice, and rectum were                         photographed. Findings:      The perianal and digital rectal examinations were normal. Pertinent       negatives include normal sphincter tone.      The terminal ileum appeared normal.      Non-bleeding internal hemorrhoids were found during retroflexion. The       hemorrhoids were Grade I (internal hemorrhoids that do not prolapse).       Estimated blood loss: none.      A 6 to 7 mm polyp was found in the ascending colon. The polyp was       sessile. The polyp was removed with a cold snare. Resection and       retrieval were complete. Estimated blood loss was minimal.      A 1 to 2 mm polyp was found in the sigmoid colon. The polyp was sessile.       The polyp was removed with a cold biopsy forceps. Resection and       retrieval were complete. Estimated blood loss was minimal.      A 6 to 8 mm polyp was found in the sigmoid colon. The polyp was       semi-pedunculated. The polyp was removed with a hot snare. Resection and       retrieval were complete. Estimated blood loss was minimal.      The exam was otherwise without abnormality on direct and retroflexion        views. Impression:            - The examined portion of the ileum was normal.                        - Non-bleeding internal hemorrhoids.                        - One 6 to 7 mm polyp in the ascending colon, removed                         with a cold snare. Resected and retrieved.                        - One 1 to 2 mm polyp in the sigmoid colon, removed                         with a cold biopsy forceps. Resected and retrieved.                        - One 6 to 8 mm polyp in the sigmoid colon, removed                         with a hot snare. Resected and retrieved.                        - The examination was otherwise normal on direct and  retroflexion views. Recommendation:        - Discharge patient to home.                        - Resume previous diet.                        - No aspirin, ibuprofen, naproxen, or other                         non-steroidal anti-inflammatory drugs for 5 days after                         polyp removal.                        - Continue present medications.                        - Await pathology results.                        - Repeat colonoscopy for surveillance based on                         pathology results.                        - Return to referring physician as previously                         scheduled. Procedure Code(s):     --- Professional ---                        [phone], Colonoscopy, flexible; with removal of                         tumor(s), polyp(s), or other lesion(s) by snare                         technique                        45380, 16, Colonoscopy, flexible; with biopsy, single                         or multiple Diagnosis Code(s):     --- Professional ---                        K64.0, First degree hemorrhoids                        K63.5, Polyp of colon                        Z83.71, Family history of colonic polyps CPT copyright 2019 American Medical Association. All rights reserved. The  codes documented in this report are preliminary and upon coder review may  be revised to meet current compliance requirements. Attending Participation:      I personally performed the entire procedure. Volney American, DO [name] DO, DO 09/26/2021 11:01:43 AM This report has been signed electronically. Number of Addenda:  0 Note Initiated On: 09/26/2021 10:17 AM Scope Withdrawal Time: 0 hours 12 minutes 33 seconds  Total Procedure Duration: 0 hours 23 minutes 19 seconds  Estimated Blood Loss:  Estimated blood loss was minimal.      [facility]

## 2021-09-29 ENCOUNTER — Encounter: Payer: Self-pay | Admitting: Gastroenterology

## 2021-09-29 LAB — SURGICAL PATHOLOGY

## 2021-12-24 ENCOUNTER — Telehealth: Payer: Self-pay

## 2021-12-24 NOTE — Telephone Encounter (Signed)
Pt needs follow up for refill of medications. Left message for pt.

## 2022-03-06 ENCOUNTER — Other Ambulatory Visit: Payer: Self-pay | Admitting: Family Medicine

## 2022-03-06 DIAGNOSIS — Z1231 Encounter for screening mammogram for malignant neoplasm of breast: Secondary | ICD-10-CM

## 2022-05-19 ENCOUNTER — Ambulatory Visit
Admission: RE | Admit: 2022-05-19 | Discharge: 2022-05-19 | Disposition: A | Discharge status: Home / Self Care | Payer: Medicare Other | Source: Ambulatory Visit | Attending: Family Medicine | Admitting: Family Medicine

## 2022-05-19 DIAGNOSIS — Z1231 Encounter for screening mammogram for malignant neoplasm of breast: Secondary | ICD-10-CM | POA: Diagnosis present

## 2022-06-08 ENCOUNTER — Ambulatory Visit (INDEPENDENT_AMBULATORY_CARE_PROVIDER_SITE_OTHER): Payer: Medicare Other | Admitting: Urology

## 2022-06-08 VITALS — BP 119/87 | HR 69 | Ht 65.0 in | Wt 220.0 lb

## 2022-06-08 DIAGNOSIS — N3946 Mixed incontinence: Secondary | ICD-10-CM | POA: Diagnosis not present

## 2022-06-08 MED ORDER — SOLIFENACIN SUCCINATE 5 MG PO TABS: 5.0000 mg | ORAL_TABLET | Freq: Every day | ORAL | 3 refills | Status: DC

## 2022-06-08 NOTE — Progress Notes (Signed)
06/08/2022 3:28 PM   Carly Marks 1955/11/11 [id number]  Referring provider: Ellamae Sia, MD [address] [place],  [place] [address]  Chief Complaint  Patient presents with   Follow-up   Over Active Bladder    HPI: I was consulted to assess the patient frequency.  For the last several months or so she said frequency and some burning.  Worse in the last 4 weeks.  Her history was more difficult.  She can void every 1 hour and cannot hold it for 2 hours.  She does leak with coughing sneezing but not bending lifting.  She has urge incontinence.  She wears 1 pad a day mildly wet.  She has bedwetting with mild severity.  She gets infrequent bladder infections   Patient has mixed incontinence but in the last month is going more frequently with a little bit of burning.  Urine looked normal but sent for culture.  I thought was reasonable to try her on an overactive bladder medication The overactive bladder medication may also help her milder bedwetting and next incontinence which did not appear to be the primary complaint.  She has never smoked.  No blood in the urine.  I have not yet recommended cystoscopy.  We were teasing her about the barbecue restaurant locally Myrbetriq samples and prescription given   TOday Frequency stable.  Last urine culture negative Last 2 months much more urge incontinence.  Clinically not infected.    Urine sent for culture and call if positive.  I called in oxybutynin ER 10 mg 3x11.  We hand-delivered to Vesicare 5 mg prescription to try 1 month if oxybutynin fails.  We went over instructions with her a few times to be clear.  Reassess in 8 weeks.  Today Patient has not been seen for over 1 year.  History was very difficult.  Clinically not infected.  I think she is doing well on Vesicare.  Voids every 4 hours.  Urge incontinence I think it is improved   PMH: Past Medical History:  Diagnosis Date   Anemia    Anxiety    Arthritis    Asthma    CAD  (coronary artery disease)    per patient   Depression    Family history of colonic polyps    GERD (gastroesophageal reflux disease)    HA (headache)    HTN (hypertension)    Major depressive disorder, recurrent episode, moderate ([place])    Obstructive sleep apnea     Surgical History: Past Surgical History:  Procedure Laterality Date   CARDIAC CATHETERIZATION  02/19/15, 06/2009, 08/2011   COLONOSCOPY N/A 04/22/2015   Procedure: COLONOSCOPY;  Surgeon: Hulen Luster, MD;  Location: [facility] ENDOSCOPY;  Service: Gastroenterology;  Laterality: N/A;   COLONOSCOPY WITH PROPOFOL N/A 09/26/2021   Procedure: COLONOSCOPY WITH PROPOFOL;  Surgeon: [name], DO;  Location: [facility] ENDOSCOPY;  Service: Endoscopy;  Laterality: N/A;   TUBAL LIGATION      Home Medications:  Allergies as of 06/08/2022       Reactions   Codeine    Dextroamphetamine    Nsaids    Morphine And Related Other (See Comments)   palpitations    Penicillins Other (See Comments)   Palpitations  Has patient had a PCN reaction causing immediate rash, facial/tongue/throat swelling, SOB or lightheadedness with hypotension: Yes Has patient had a PCN reaction causing severe rash involving mucus membranes or skin necrosis: No Has patient had a PCN reaction that required hospitalization:  No Has patient had a PCN reaction occurring within the last 10 years: No If all of the above answers are "NO", then may proceed with Cephalosporin use.   Tolmetin         Medication List        Accurate as of June 08, 2022  3:28 PM. If you have any questions, ask your nurse or doctor.          acetaminophen 500 MG tablet Commonly known as: TYLENOL Take 1,000 mg by mouth every 4 (four) hours as needed for mild pain or moderate pain.   amLODipine-benazepril 5-10 MG capsule Commonly known as: LOTREL Take 1 capsule by mouth daily.   aspirin 81 MG tablet Take 81 mg by mouth daily.   atorvastatin 40 MG tablet Commonly known as:  LIPITOR Take 40 mg by mouth daily at 6 PM.   benztropine 0.5 MG tablet Commonly known as: COGENTIN Take 0.5 mg by mouth daily.   cholecalciferol 10 MCG (400 UNIT) Tabs tablet Commonly known as: VITAMIN D3 Take 400 Units by mouth daily.   clopidogrel 75 MG tablet Commonly known as: PLAVIX Take 75 mg by mouth daily.   divalproex 250 MG DR tablet Commonly known as: DEPAKOTE Take 3 tablets (750 mg total) by mouth 2 (two) times daily.   FLUoxetine 20 MG capsule Commonly known as: PROZAC Take 3 capsules (60 mg total) by mouth daily.   fluticasone 50 MCG/ACT nasal spray Commonly known as: FLONASE Place 2 sprays into the nose daily.   isosorbide dinitrate 30 MG tablet Commonly known as: ISORDIL Take 30 mg by mouth daily.   LORazepam 1 MG tablet Commonly known as: ATIVAN Take 1 tablet (1 mg total) by mouth 2 (two) times daily.   melatonin 3 MG Tabs tablet Take 1 tablet (3 mg total) by mouth at bedtime.   metoprolol succinate 50 MG 24 hr tablet Commonly known as: TOPROL-XL Take 50 mg by mouth daily.   mirabegron ER 50 MG Tb24 tablet Commonly known as: MYRBETRIQ Take 1 tablet (50 mg total) by mouth daily.   OLANZapine 5 MG tablet Commonly known as: ZYPREXA Take by mouth.   omeprazole 20 MG capsule Commonly known as: PRILOSEC Take 20 mg by mouth daily.   oxybutynin 10 MG 24 hr tablet Commonly known as: DITROPAN-XL Take 1 tablet (10 mg total) by mouth daily.   pantoprazole 20 MG tablet Commonly known as: PROTONIX Take 20 mg by mouth daily.   solifenacin 5 MG tablet Commonly known as: VESICARE Take 1 tablet (5 mg total) by mouth daily.   Symbicort 80-4.5 MCG/ACT inhaler Generic drug: budesonide-formoterol Inhale into the lungs.   traZODone 50 MG tablet Commonly known as: DESYREL        Allergies:  Allergies  Allergen Reactions   Codeine    Dextroamphetamine    Nsaids    Morphine And Related Other (See Comments)    palpitations    Penicillins  Other (See Comments)    Palpitations  Has patient had a PCN reaction causing immediate rash, facial/tongue/throat swelling, SOB or lightheadedness with hypotension: Yes Has patient had a PCN reaction causing severe rash involving mucus membranes or skin necrosis: No Has patient had a PCN reaction that required hospitalization: No Has patient had a PCN reaction occurring within the last 10 years: No If all of the above answers are "NO", then may proceed with Cephalosporin use.    Tolmetin     Family History: Family History  Problem Relation  Age of Onset   Hypertension Mother    Diabetes Maternal Aunt    Diabetes Maternal Uncle    Hypertension Sister    Breast cancer Sister 63   Colon cancer Maternal Grandmother    Hypertension Brother    Depression Other    Colon polyps Other     Social History:  reports that she has never smoked. She has never used smokeless tobacco. She reports that she does not drink alcohol and does not use drugs.  ROS:                                        Physical Exam: BP 119/87   Pulse 69   Ht '5\' 5"'$  (1.651 m)   Wt 99.8 kg   BMI 36.61 kg/m   Constitutional:  Alert and oriented, No acute distress. HEENT: [place] AT, moist mucus membranes.  Trachea midline, no masses.   Laboratory Data: Lab Results  Component Value Date   WBC 8.2 01/01/2019   HGB 12.6 01/01/2019   HCT 38.4 01/01/2019   MCV 77.0 (L) 01/01/2019   PLT 156 01/01/2019    Lab Results  Component Value Date   CREATININE 1.25 (H) 01/01/2019    No results found for: "PSA"  No results found for: "TESTOSTERONE"  Lab Results  Component Value Date   HGBA1C 4.9 06/25/2018    Urinalysis    Component Value Date/Time   COLORURINE YELLOW (A) 06/06/2020 1439   APPEARANCEUR Hazy (A) 12/16/2020 1542   LABSPEC 1.014 06/06/2020 1439   LABSPEC 1.014 10/24/2014 1544   PHURINE 6.0 06/06/2020 1439   GLUCOSEU Negative 12/16/2020 1542   GLUCOSEU Negative 10/24/2014  1544   HGBUR LARGE (A) 06/06/2020 1439   BILIRUBINUR Negative 12/16/2020 1542   BILIRUBINUR Negative 10/24/2014 1544   KETONESUR NEGATIVE 06/06/2020 1439   PROTEINUR Negative 12/16/2020 1542   PROTEINUR 30 (A) 06/06/2020 1439   NITRITE Negative 12/16/2020 1542   NITRITE NEGATIVE 06/06/2020 1439   LEUKOCYTESUR 1+ (A) 12/16/2020 1542   LEUKOCYTESUR LARGE (A) 06/06/2020 1439   LEUKOCYTESUR 3+ 10/24/2014 1544    Pertinent Imaging:   Assessment & Plan: 90x3 sent to pharmacy and is seen a year  1. Mixed incontinence    No follow-ups on file.  Reece Packer, MD  [facility] [address] [place],  [address] [phone]

## 2022-12-15 ENCOUNTER — Ambulatory Visit (INDEPENDENT_AMBULATORY_CARE_PROVIDER_SITE_OTHER): Payer: 59 | Admitting: Cardiovascular Disease

## 2022-12-15 ENCOUNTER — Encounter: Payer: Self-pay | Admitting: Cardiovascular Disease

## 2022-12-15 VITALS — BP 108/68 | HR 92 | Ht 66.0 in | Wt 262.0 lb

## 2022-12-15 DIAGNOSIS — R001 Bradycardia, unspecified: Secondary | ICD-10-CM | POA: Diagnosis not present

## 2022-12-15 DIAGNOSIS — I25118 Atherosclerotic heart disease of native coronary artery with other forms of angina pectoris: Secondary | ICD-10-CM | POA: Diagnosis not present

## 2022-12-15 DIAGNOSIS — I110 Hypertensive heart disease with heart failure: Secondary | ICD-10-CM

## 2022-12-15 DIAGNOSIS — I10 Essential (primary) hypertension: Secondary | ICD-10-CM | POA: Diagnosis not present

## 2022-12-15 DIAGNOSIS — I5032 Chronic diastolic (congestive) heart failure: Secondary | ICD-10-CM

## 2022-12-15 NOTE — Assessment & Plan Note (Signed)
Stable. 

## 2022-12-15 NOTE — Progress Notes (Signed)
Cardiology Office Note   Date:  12/15/2022   ID:  Carly, Marks 1956/06/29, MRN [id number]  PCP:  Ellamae Sia, MD  Cardiologist:  Neoma Laming, MD      History of Present Illness: Carly Marks is a 67 y.o. female who presents  Breathing Problem She complains of difficulty breathing. This is a chronic problem. The current episode started in the past 7 days. The problem has been resolved.      Past Medical History:  Diagnosis Date   Anemia    Anxiety    Arthritis    Asthma    CAD (coronary artery disease)    per patient   Depression    Family history of colonic polyps    GERD (gastroesophageal reflux disease)    HA (headache)    HTN (hypertension)    Major depressive disorder, recurrent episode, moderate ([place])    Obstructive sleep apnea      Past Surgical History:  Procedure Laterality Date   CARDIAC CATHETERIZATION  02/19/15, 06/2009, 08/2011   COLONOSCOPY N/A 04/22/2015   Procedure: COLONOSCOPY;  Surgeon: Hulen Luster, MD;  Location: [facility] ENDOSCOPY;  Service: Gastroenterology;  Laterality: N/A;   COLONOSCOPY WITH PROPOFOL N/A 09/26/2021   Procedure: COLONOSCOPY WITH PROPOFOL;  Surgeon: [name], DO;  Location: [facility] ENDOSCOPY;  Service: Endoscopy;  Laterality: N/A;   TUBAL LIGATION       Current Outpatient Medications  Medication Sig Dispense Refill   albuterol (VENTOLIN HFA) 108 (90 Base) MCG/ACT inhaler Inhale 2 puffs into the lungs every 6 (six) hours as needed.     amiodarone (PACERONE) 400 MG tablet Take 400 mg by mouth daily.     amLODipine-benazepril (LOTREL) 5-10 MG capsule Take 1 capsule by mouth daily.     apixaban (ELIQUIS) 5 MG TABS tablet Take 5 mg by mouth 2 (two) times daily.     atorvastatin (LIPITOR) 40 MG tablet Take 40 mg by mouth daily at 6 PM.      benztropine (COGENTIN) 0.5 MG tablet Take 0.5 mg by mouth daily.     cholecalciferol (VITAMIN D) 400 UNITS TABS tablet Take 400 Units by mouth daily.      clopidogrel  (PLAVIX) 75 MG tablet Take 75 mg by mouth daily.     fluticasone (FLONASE) 50 MCG/ACT nasal spray Place 2 sprays into the nose daily.     furosemide (LASIX) 20 MG tablet Take 20 mg by mouth daily.     metoprolol succinate (TOPROL-XL) 25 MG 24 hr tablet Take 25 mg by mouth daily.     OLANZapine (ZYPREXA) 5 MG tablet Take by mouth.     pantoprazole (PROTONIX) 20 MG tablet Take 20 mg by mouth daily.     SYMBICORT 80-4.5 MCG/ACT inhaler Inhale into the lungs.     aspirin 81 MG tablet Take 81 mg by mouth daily. (Patient not taking: Reported on 06/08/2022)     divalproex (DEPAKOTE) 250 MG DR tablet Take 3 tablets (750 mg total) by mouth 2 (two) times daily. 100 tablet 0   FLUoxetine (PROZAC) 20 MG capsule Take 3 capsules (60 mg total) by mouth daily. 90 capsule 3   LORazepam (ATIVAN) 1 MG tablet Take 1 tablet (1 mg total) by mouth 2 (two) times daily. (Patient not taking: Reported on 12/15/2022) 60 tablet 2   Melatonin 3 MG TABS Take 1 tablet (3 mg total) by mouth at bedtime. (Patient not taking: Reported on 12/15/2022) 30 tablet 1  solifenacin (VESICARE) 5 MG tablet Take 1 tablet (5 mg total) by mouth daily. 90 tablet 3   traZODone (DESYREL) 50 MG tablet  (Patient not taking: Reported on 12/15/2022)     No current facility-administered medications for this visit.    Allergies:   Codeine, Dextroamphetamine, Nsaids, Morphine and related, Penicillins, and Tolmetin    Social History:   reports that she has never smoked. She has never used smokeless tobacco. She reports that she does not drink alcohol and does not use drugs.   Family History:  family history includes Breast cancer (age of onset: 58) in her sister; Colon cancer in her maternal grandmother; Colon polyps in an other family member; Depression in an other family member; Diabetes in her maternal aunt and maternal uncle; Hypertension in her brother, mother, and sister.    ROS:     Review of Systems  Constitutional: Negative.   HENT: Negative.     Eyes: Negative.   Respiratory: Negative.    Gastrointestinal: Negative.   Genitourinary: Negative.   Musculoskeletal: Negative.   Skin: Negative.   Neurological: Negative.   Endo/Heme/Allergies: Negative.   Psychiatric/Behavioral: Negative.    All other systems reviewed and are negative.     All other systems are reviewed and negative.    PHYSICAL EXAM: VS:  BP 108/68 (BP Location: Left Arm, Patient Position: Sitting, Cuff Size: Large)   Pulse 92   Ht '5\' 6"'$  (1.676 m)   Wt 262 lb (118.8 kg)   BMI 42.29 kg/m  , BMI Body mass index is 42.29 kg/m. Last weight:  Wt Readings from Last 3 Encounters:  12/15/22 262 lb (118.8 kg)  06/08/22 220 lb (99.8 kg)  09/26/21 220 lb (99.8 kg)     Physical Exam Constitutional:      Appearance: Normal appearance.  Cardiovascular:     Rate and Rhythm: Normal rate and regular rhythm.     Heart sounds: Normal heart sounds.  Pulmonary:     Effort: Pulmonary effort is normal.     Breath sounds: Normal breath sounds.  Musculoskeletal:     Right lower leg: No edema.     Left lower leg: No edema.  Neurological:     Mental Status: She is alert.       EKG: none  Recent Labs: REASON FOR VISIT  Referred by Dr.Brodin Gelpi Humphrey Rolls.        TESTS  Imaging: Computed Tomographic Angiography:  Cardiac multidetector CT was performed paying particular attention to the coronary arteries for the diagnosis of: CAD. I 25.10.     TEST CONCLUSIONS  Calcium score only.  1-Calcium score: 841.3. Neoma Laming MD  Electronically signed by: Neoma Laming     Date: 01/16/2022 09:20   REASON FOR VISIT  Visit for: Echocardiogram  Sex:   Female       female   wt=    lbs.  BP=  Height=    inches.        TESTS  Imaging: Echocardiogram:  An echocardiogram in (2-d) mode was performed and in Doppler mode with color flow velocity mapping was performed. The aortic valve cusps are abnormal 2.4   cm, flow velocity .882   m/s, and systolic calculated mean  flow gradient 2  mmHg. Mitral valve diastolic peak flow velocity E .76    m/s. Aortic root diameter 3.4   cm. The LVOT internal diameter 2.8   cm and flow velocity was abnormal .245   m/s. LV systolic dimension 2.5  cm, diastolic 3.5  cm, posterior wall thickness 1.37    cm, fractional shortening 28.6  %, and EF 56.2 %. IVS thickness 1.63    cm. LA dimension 4.5 cm. Tricuspid Valve has Severe Regurgitation. Mitral Valve has Moderate Regurgitation. Pulmonic Valve has Trace Regurgitation.     ASSESSMENT  Technically adequate study.  Normal left ventricular systolic function.  Mild left ventricular hypertrophy with GRADE 3 (restrictive physiology) diastolic dysfunction.  Normal right ventricular systolic function.  Normal right ventricular diastolic function.  Normal left ventricular wall motion.  Normal right ventricular wall motion.  Trace pulmonary regurgitation.  Severe tricuspid regurgitation.  Moderate pulmonary hypertension.  Moderate mitral regurgitation.  No pericardial effusion.  Severely dilated Left atrium  Mildly dilated Right atrium  Mildly dilated Left ventricle  Normal Ao root and ascending Ao   Moderate LVH.     THERAPY   Referring physician: Ardeth Sportsman A. Tejan-Sie  Chelsa Wallie Char  Sonographer:     TESTS    [facility] [address] [place], [place] [address] [phone] STUDY:  Gated Stress / Rest Myocardial Perfusion Imaging Tomographic (SPECT) Including attenuation correction Wall Motion, Left Ventricular Ejection Fraction By Gated Technique.Persantine Stress Test. SEX: Female  WEIGHT: 240 lbs  HEIGHT: 65 in   ARMS UP: YES/NO                                                                                                                                                                                REFERRING PHYSICIAN: Dr.Nicasio Barlowe Humphrey Rolls  INDICATION FOR STUDY:  I 48.91                                                                                                                                                                                                                   TECHNIQUE:  Approximately 20 minutes following the intravenous administration of 10.2 mCi of Tc-18mSestamibi after stress testing in a reclined supine position with arms above their head if able to do so, gated SPECT imaging of the heart was performed. After about a 2hr break, the patient was injected intravenously with 31.6 mCi of Tc-961mestamibi.  Approximately 45 minutes later in the same position as stress imaging SPECT rest imaging of the heart was performed.  STRESS BY:  ShNeoma LamingMD PROTOCOL:  Persantine   DOSE ADMIN: 10 cc  ROUTE OF ADMINISTRATION: IV                                                                            MAX PRED HR: 155                     85%:  132              75%: 116                                                                                                                   RESTING BP: 122/78  RESTING HR: 77  PEAK BP: 114/72  PEAK HR: 96  EXERCISE DURATION:    4 min injection                                            REASON FOR TEST TERMINATION:    Protocol end                                                                                                                              SYMPTOMS:   None                                                                                                                                                                                                          EKG RESULTS: NSR. 77/min. No significant ST changes with persantine.                                                               IMAGE QUALITY: Fair  PERFUSION/WALL MOTION FINDINGS: EF = 76%. Moderate size and intensity reversible basal anterior, mid anterior, and apical anterior wall defect, normal wall motion.                                                                          IMPRESSION: Ischemia in the LAD territory with normal LVEF, advise CCTA.                                                                                                                                                                                                                                                                                         Neoma Laming, MD Stress Interpreting Physician / Nuclear Interpreting Physician                Neoma Laming MD  Electronically signed by: Neoma Laming     Date: 12/11/2021 11:28  Neoma Laming MD  Electronically signed by: Neoma Laming     Date: 02/02/223 10:33 Lipid Panel    Component Value Date/Time   CHOL 108 06/25/2018 0442   CHOL 171 02/04/2015 0251   TRIG 27 06/25/2018 0442   TRIG 41 02/04/2015 0251   HDL 41 06/25/2018 0442   HDL 63 02/04/2015 0251   CHOLHDL 2.6 06/25/2018 0442   VLDL 5 06/25/2018 0442   VLDL 8 02/04/2015 0251   LDLCALC 62 06/25/2018 0442   LDLCALC 100 (H) 02/04/2015 0251      Other studies Reviewed: Additional studies/ records that were reviewed today include:  Review of the above records demonstrates:       No data to display            ASSESSMENT AND PLAN:    ICD-10-CM   1. Symptomatic bradycardia  R00.1     2. Coronary artery disease of native artery of native heart with stable  angina pectoris ([place])  I25.118  3. Primary hypertension  I10     4. Hypertensive heart disease with chronic diastolic congestive heart failure (HCC)  I11.0    I50.32        Problem List Items Addressed This Visit       Cardiovascular and Mediastinum   CAD (coronary artery disease)    Stable      Relevant Medications   apixaban (ELIQUIS) 5 MG TABS tablet   furosemide (LASIX) 20 MG tablet   amiodarone (PACERONE) 400 MG tablet   metoprolol succinate (TOPROL-XL) 25 MG 24 hr tablet   HTN (hypertension)   Relevant Medications   apixaban (ELIQUIS) 5 MG TABS tablet   furosemide (LASIX) 20 MG tablet   amiodarone (PACERONE) 400 MG tablet   metoprolol succinate (TOPROL-XL) 25 MG 24 hr tablet   HCD (hypertensive cardiovascular disease)   Relevant Medications   apixaban (ELIQUIS) 5 MG TABS tablet   furosemide (LASIX) 20 MG tablet   amiodarone (PACERONE) 400 MG tablet   metoprolol succinate (TOPROL-XL) 25 MG 24 hr tablet     Other   Symptomatic bradycardia - Primary    Continue amiodrone and metoprolol.          Disposition:   Return in about 2 months (around 02/13/2023).      Signed,  Neoma Laming, MD  12/15/2022 11:02 [place]

## 2022-12-15 NOTE — Assessment & Plan Note (Signed)
Continue amiodrone and metoprolol.

## 2022-12-17 ENCOUNTER — Other Ambulatory Visit: Payer: Self-pay | Admitting: Cardiovascular Disease

## 2023-01-07 ENCOUNTER — Other Ambulatory Visit: Payer: Self-pay

## 2023-01-07 DIAGNOSIS — I10 Essential (primary) hypertension: Secondary | ICD-10-CM

## 2023-01-07 DIAGNOSIS — I2510 Atherosclerotic heart disease of native coronary artery without angina pectoris: Secondary | ICD-10-CM

## 2023-01-07 DIAGNOSIS — I639 Cerebral infarction, unspecified: Secondary | ICD-10-CM

## 2023-01-07 MED ORDER — ATORVASTATIN CALCIUM 40 MG PO TABS: 40.0000 mg | ORAL_TABLET | Freq: Every day | ORAL | 2 refills | Status: DC

## 2023-01-07 MED ORDER — METOPROLOL SUCCINATE ER 25 MG PO TB24: 25.0000 mg | ORAL_TABLET | Freq: Every day | ORAL | 2 refills | Status: DC

## 2023-01-07 MED ORDER — APIXABAN 5 MG PO TABS: 5.0000 mg | ORAL_TABLET | Freq: Two times a day (BID) | ORAL | 2 refills | Status: DC

## 2023-01-08 ENCOUNTER — Encounter: Payer: Self-pay | Admitting: Cardiovascular Disease

## 2023-01-08 ENCOUNTER — Ambulatory Visit (INDEPENDENT_AMBULATORY_CARE_PROVIDER_SITE_OTHER): Payer: 59 | Admitting: Cardiovascular Disease

## 2023-01-08 VITALS — BP 134/80 | HR 86 | Ht 66.0 in | Wt 258.0 lb

## 2023-01-08 DIAGNOSIS — I63532 Cerebral infarction due to unspecified occlusion or stenosis of left posterior cerebral artery: Secondary | ICD-10-CM | POA: Diagnosis not present

## 2023-01-08 DIAGNOSIS — I482 Chronic atrial fibrillation, unspecified: Secondary | ICD-10-CM | POA: Insufficient documentation

## 2023-01-08 DIAGNOSIS — I10 Essential (primary) hypertension: Secondary | ICD-10-CM

## 2023-01-08 DIAGNOSIS — R42 Dizziness and giddiness: Secondary | ICD-10-CM | POA: Insufficient documentation

## 2023-01-08 DIAGNOSIS — I2510 Atherosclerotic heart disease of native coronary artery without angina pectoris: Secondary | ICD-10-CM | POA: Diagnosis not present

## 2023-01-08 MED ORDER — METOPROLOL SUCCINATE ER 50 MG PO TB24: 25.0000 mg | ORAL_TABLET | Freq: Every day | ORAL | 3 refills | Status: DC

## 2023-01-08 NOTE — Progress Notes (Signed)
Cardiology Office Note   Date:  01/08/2023   ID:  Carly Marks, DOB August 28, 1956, MRN [id number]  PCP:  Ellamae Sia, MD  Cardiologist:  Neoma Laming, MD      History of Present Illness: Carly Marks is a 67 y.o. female who presents for  Chief Complaint  Patient presents with   Follow-up   Acute Visit    dizziness    Patient in office complaining of dizziness. Patient believes amiodarone is causing her dizziness.   Dizziness This is a new problem. The current episode started in the past 7 days. The problem occurs daily. The problem has been unchanged. The symptoms are aggravated by standing. She has tried nothing for the symptoms. The treatment provided no relief.    Past Medical History:  Diagnosis Date   Anemia    Anxiety    Arthritis    Asthma    CAD (coronary artery disease)    per patient   Depression    Family history of colonic polyps    GERD (gastroesophageal reflux disease)    HA (headache)    HTN (hypertension)    Major depressive disorder, recurrent episode, moderate ([place])    Obstructive sleep apnea      Past Surgical History:  Procedure Laterality Date   CARDIAC CATHETERIZATION  02/19/15, 06/2009, 08/2011   COLONOSCOPY N/A 04/22/2015   Procedure: COLONOSCOPY;  Surgeon: Hulen Luster, MD;  Location: [facility] ENDOSCOPY;  Service: Gastroenterology;  Laterality: N/A;   COLONOSCOPY WITH PROPOFOL N/A 09/26/2021   Procedure: COLONOSCOPY WITH PROPOFOL;  Surgeon: [name], DO;  Location[facility] ENDOSCOPY;  Service: Endoscopy;  Laterality: N/A;   TUBAL LIGATION       Current Outpatient Medications  Medication Sig Dispense Refill   albuterol (VENTOLIN HFA) 108 (90 Base) MCG/ACT inhaler Inhale 2 puffs into the lungs every 6 (six) hours as needed.     amiodarone (PACERONE) 400 MG tablet Take 200 mg by mouth daily.     amLODipine-benazepril (LOTREL) 5-10 MG capsule Take 1 capsule by mouth daily.     apixaban (ELIQUIS) 5 MG TABS tablet Take 1  tablet (5 mg total) by mouth 2 (two) times daily. 60 tablet 2   aspirin 81 MG tablet Take 81 mg by mouth daily.     atorvastatin (LIPITOR) 40 MG tablet Take 1 tablet (40 mg total) by mouth daily at 6 PM. 30 tablet 2   benztropine (COGENTIN) 0.5 MG tablet Take 0.5 mg by mouth daily.     cholecalciferol (VITAMIN D) 400 UNITS TABS tablet Take 400 Units by mouth daily.      clopidogrel (PLAVIX) 75 MG tablet Take 75 mg by mouth daily.     divalproex (DEPAKOTE) 250 MG DR tablet Take 3 tablets (750 mg total) by mouth 2 (two) times daily. 100 tablet 0   FLUoxetine (PROZAC) 20 MG capsule Take 3 capsules (60 mg total) by mouth daily. 90 capsule 3   fluticasone (FLONASE) 50 MCG/ACT nasal spray Place 2 sprays into the nose daily.     furosemide (LASIX) 20 MG tablet Take 20 mg by mouth daily.     LORazepam (ATIVAN) 1 MG tablet Take 1 tablet (1 mg total) by mouth 2 (two) times daily. 60 tablet 2   Melatonin 3 MG TABS Take 1 tablet (3 mg total) by mouth at bedtime. 30 tablet 1   OLANZapine (ZYPREXA) 5 MG tablet Take by mouth.     pantoprazole (PROTONIX) 20 MG tablet  Take 20 mg by mouth daily.     solifenacin (VESICARE) 5 MG tablet Take 1 tablet (5 mg total) by mouth daily. 90 tablet 3   SYMBICORT 80-4.5 MCG/ACT inhaler Inhale into the lungs.     traZODone (DESYREL) 50 MG tablet      metoprolol succinate (TOPROL-XL) 50 MG 24 hr tablet Take 0.5 tablets (25 mg total) by mouth daily. 30 tablet 3   No current facility-administered medications for this visit.    Allergies:   Codeine, Dextroamphetamine, Nsaids, Morphine and related, Penicillins, and Tolmetin    Social History:   reports that she has never smoked. She has never used smokeless tobacco. She reports that she does not drink alcohol and does not use drugs.   Family History:  family history includes Breast cancer (age of onset: 67) in her sister; Colon cancer in her maternal grandmother; Colon polyps in an other family member; Depression in an other  family member; Diabetes in her maternal aunt and maternal uncle; Hypertension in her brother, mother, and sister.    ROS:     Review of Systems  Constitutional: Negative.   HENT: Negative.    Eyes: Negative.   Respiratory: Negative.    Gastrointestinal: Negative.   Genitourinary: Negative.   Musculoskeletal: Negative.   Skin: Negative.   Neurological:  Positive for dizziness.  Endo/Heme/Allergies: Negative.   Psychiatric/Behavioral: Negative.    All other systems reviewed and are negative.   All other systems are reviewed and negative.   PHYSICAL EXAM: VS:  BP 134/80   Pulse 86   Ht '5\' 6"'$  (1.676 m)   Wt 258 lb (117 kg)   BMI 41.64 kg/m  , BMI Body mass index is 41.64 kg/m. Last weight:  Wt Readings from Last 3 Encounters:  01/08/23 258 lb (117 kg)  12/15/22 262 lb (118.8 kg)  06/08/22 220 lb (99.8 kg)    Physical Exam Constitutional:      Appearance: Normal appearance.  Cardiovascular:     Rate and Rhythm: Normal rate and regular rhythm.     Heart sounds: Normal heart sounds.  Pulmonary:     Effort: Pulmonary effort is normal.     Breath sounds: Normal breath sounds.  Musculoskeletal:     Right lower leg: No edema.     Left lower leg: No edema.  Neurological:     Mental Status: She is alert.     EKG: atrial fibrillation, HR 86 bpm  Recent Labs: No results found for requested labs within last 365 days.    Lipid Panel    Component Value Date/Time   CHOL 108 06/25/2018 0442   CHOL 171 02/04/2015 0251   TRIG 27 06/25/2018 0442   TRIG 41 02/04/2015 0251   HDL 41 06/25/2018 0442   HDL 63 02/04/2015 0251   CHOLHDL 2.6 06/25/2018 0442   VLDL 5 06/25/2018 0442   VLDL 8 02/04/2015 0251   LDLCALC 62 06/25/2018 0442   LDLCALC 100 (H) 02/04/2015 0251      ASSESSMENT AND PLAN:    ICD-10-CM   1. Dizziness  R42     2. Coronary artery disease involving native coronary artery of native heart without angina pectoris  I25.10 metoprolol succinate (TOPROL-XL)  50 MG 24 hr tablet    3. Cerebrovascular accident (CVA) due to occlusion of left posterior cerebral artery (HCC)  I63.532     4. Primary hypertension  I10 metoprolol succinate (TOPROL-XL) 50 MG 24 hr tablet    5. Chronic atrial  fibrillation ([place])  I48.20        Problem List Items Addressed This Visit       Cardiovascular and Mediastinum   CAD (coronary artery disease)   Relevant Medications   metoprolol succinate (TOPROL-XL) 50 MG 24 hr tablet   HTN (hypertension)   Relevant Medications   metoprolol succinate (TOPROL-XL) 50 MG 24 hr tablet   Cerebrovascular accident (CVA) due to occlusion of left posterior cerebral artery (HCC)   Relevant Medications   metoprolol succinate (TOPROL-XL) 50 MG 24 hr tablet   Chronic atrial fibrillation ([place])    Patient in atrial fibrillation. Dizziness likely from this. Will decrease amiodarone to 400 mg daily and increase metoprolol to 50 mg daily.       Relevant Medications   metoprolol succinate (TOPROL-XL) 50 MG 24 hr tablet     Other   Dizziness - Primary     Disposition:   Return in about 2 weeks (around 01/22/2023).    Total time spent: 30 minutes  Signed,  Neoma Laming, MD  01/08/2023 11:52 AM    [place]

## 2023-01-08 NOTE — Patient Instructions (Signed)
Decrease amiodarone to 400 mg once daily Increase metoprolol to 50 mg daily

## 2023-01-08 NOTE — Assessment & Plan Note (Signed)
Patient in atrial fibrillation. Dizziness likely from this. Will decrease amiodarone to 400 mg daily and increase metoprolol to 50 mg daily.

## 2023-01-11 ENCOUNTER — Other Ambulatory Visit: Payer: Self-pay

## 2023-01-15 ENCOUNTER — Other Ambulatory Visit: Payer: Self-pay | Admitting: Cardiovascular Disease

## 2023-01-15 DIAGNOSIS — I2510 Atherosclerotic heart disease of native coronary artery without angina pectoris: Secondary | ICD-10-CM

## 2023-01-15 DIAGNOSIS — I10 Essential (primary) hypertension: Secondary | ICD-10-CM

## 2023-01-15 MED ORDER — METOPROLOL SUCCINATE ER 25 MG PO TB24: 25.0000 mg | ORAL_TABLET | Freq: Every day | ORAL | 1 refills | Status: DC

## 2023-01-22 ENCOUNTER — Other Ambulatory Visit: Payer: Self-pay | Admitting: Cardiovascular Disease

## 2023-01-22 ENCOUNTER — Ambulatory Visit: Payer: 59 | Admitting: Cardiovascular Disease

## 2023-01-22 ENCOUNTER — Telehealth: Payer: Self-pay

## 2023-01-22 DIAGNOSIS — I2510 Atherosclerotic heart disease of native coronary artery without angina pectoris: Secondary | ICD-10-CM

## 2023-01-22 DIAGNOSIS — I10 Essential (primary) hypertension: Secondary | ICD-10-CM

## 2023-01-22 MED ORDER — METOPROLOL SUCCINATE ER 50 MG PO TB24: 50.0000 mg | ORAL_TABLET | Freq: Every day | ORAL | 2 refills | Status: DC

## 2023-01-22 NOTE — Telephone Encounter (Signed)
In the last office note it says she's supposed to increase her metoprolol to 50mg  can you send in the new rx for patient to pharmacy if you want her to continue this increased dose

## 2023-01-29 ENCOUNTER — Ambulatory Visit (INDEPENDENT_AMBULATORY_CARE_PROVIDER_SITE_OTHER): Payer: 59 | Admitting: Cardiovascular Disease

## 2023-01-29 ENCOUNTER — Encounter: Payer: Self-pay | Admitting: Cardiovascular Disease

## 2023-01-29 VITALS — BP 130/80 | HR 95 | Ht 66.0 in | Wt 265.0 lb

## 2023-01-29 DIAGNOSIS — I10 Essential (primary) hypertension: Secondary | ICD-10-CM

## 2023-01-29 DIAGNOSIS — I482 Chronic atrial fibrillation, unspecified: Secondary | ICD-10-CM

## 2023-01-29 DIAGNOSIS — I25118 Atherosclerotic heart disease of native coronary artery with other forms of angina pectoris: Secondary | ICD-10-CM

## 2023-01-29 MED ORDER — AMIODARONE HCL 400 MG PO TABS: 400.0000 mg | ORAL_TABLET | Freq: Two times a day (BID) | ORAL | 0 refills | Status: DC

## 2023-01-29 NOTE — Progress Notes (Signed)
Cardiology Office Note   Date:  01/29/2023   ID:  Mayzell, Banken 11-08-1956, MRN [id number]  PCP:  Ellamae Sia, MD  Cardiologist:  Neoma Laming, MD      History of Present Illness: Carly Marks is a 67 y.o. female who presents for  Chief Complaint  Patient presents with   Follow-up    2 week follow up    Feeling better, no chest pain or palpitation      Past Medical History:  Diagnosis Date   Anemia    Anxiety    Arthritis    Asthma    CAD (coronary artery disease)    per patient   Depression    Family history of colonic polyps    GERD (gastroesophageal reflux disease)    HA (headache)    HTN (hypertension)    Major depressive disorder, recurrent episode, moderate ([place])    Obstructive sleep apnea      Past Surgical History:  Procedure Laterality Date   CARDIAC CATHETERIZATION  02/19/15, 06/2009, 08/2011   COLONOSCOPY N/A 04/22/2015   Procedure: COLONOSCOPY;  Surgeon: Hulen Luster, MD;  Location: [facility] ENDOSCOPY;  Service: Gastroenterology;  Laterality: N/A;   COLONOSCOPY WITH PROPOFOL N/A 09/26/2021   Procedure: COLONOSCOPY WITH PROPOFOL;  Surgeon: [name], DO;  Location: [facility] ENDOSCOPY;  Service: Endoscopy;  Laterality: N/A;   TUBAL LIGATION       Current Outpatient Medications  Medication Sig Dispense Refill   albuterol (VENTOLIN HFA) 108 (90 Base) MCG/ACT inhaler Inhale 2 puffs into the lungs every 6 (six) hours as needed.     amLODipine-benazepril (LOTREL) 5-10 MG capsule Take 1 capsule by mouth daily.     apixaban (ELIQUIS) 5 MG TABS tablet Take 1 tablet (5 mg total) by mouth 2 (two) times daily. 60 tablet 2   aspirin 81 MG tablet Take 81 mg by mouth daily.     atorvastatin (LIPITOR) 40 MG tablet Take 1 tablet (40 mg total) by mouth daily at 6 PM. 30 tablet 2   benztropine (COGENTIN) 0.5 MG tablet Take 0.5 mg by mouth daily.     cholecalciferol (VITAMIN D) 400 UNITS TABS tablet Take 400 Units by mouth daily.       clopidogrel (PLAVIX) 75 MG tablet Take 75 mg by mouth daily.     divalproex (DEPAKOTE) 250 MG DR tablet Take 3 tablets (750 mg total) by mouth 2 (two) times daily. 100 tablet 0   FLUoxetine (PROZAC) 20 MG capsule Take 3 capsules (60 mg total) by mouth daily. 90 capsule 3   fluticasone (FLONASE) 50 MCG/ACT nasal spray Place 2 sprays into the nose daily.     furosemide (LASIX) 20 MG tablet Take 20 mg by mouth daily.     LORazepam (ATIVAN) 1 MG tablet Take 1 tablet (1 mg total) by mouth 2 (two) times daily. 60 tablet 2   Melatonin 3 MG TABS Take 1 tablet (3 mg total) by mouth at bedtime. 30 tablet 1   metoprolol succinate (TOPROL-XL) 50 MG 24 hr tablet Take 1 tablet (50 mg total) by mouth daily. 30 tablet 2   OLANZapine (ZYPREXA) 5 MG tablet Take by mouth.     pantoprazole (PROTONIX) 20 MG tablet Take 20 mg by mouth daily.     solifenacin (VESICARE) 5 MG tablet Take 1 tablet (5 mg total) by mouth daily. 90 tablet 3   SYMBICORT 80-4.5 MCG/ACT inhaler Inhale into the lungs.     traZODone (  DESYREL) 50 MG tablet      amiodarone (PACERONE) 400 MG tablet Take 1 tablet (400 mg total) by mouth 2 (two) times daily. 60 tablet 0   No current facility-administered medications for this visit.    Allergies:   Codeine, Dextroamphetamine, Nsaids, Morphine and related, Penicillins, and Tolmetin    Social History:   reports that she has never smoked. She has never used smokeless tobacco. She reports that she does not drink alcohol and does not use drugs.   Family History:  family history includes Breast cancer (age of onset: 55) in her sister; Colon cancer in her maternal grandmother; Colon polyps in an other family member; Depression in an other family member; Diabetes in her maternal aunt and maternal uncle; Hypertension in her brother, mother, and sister.    ROS:     Review of Systems  Constitutional: Negative.   HENT: Negative.    Eyes: Negative.   Respiratory: Negative.    Gastrointestinal: Negative.    Genitourinary: Negative.   Musculoskeletal: Negative.   Skin: Negative.   Neurological: Negative.   Endo/Heme/Allergies: Negative.   Psychiatric/Behavioral: Negative.    All other systems reviewed and are negative.     All other systems are reviewed and negative.    PHYSICAL EXAM: VS:  BP 130/80   Pulse 95   Ht 5\' 6"  (1.676 m)   Wt 265 lb (120.2 kg)   SpO2 96%   BMI 42.77 kg/m  , BMI Body mass index is 42.77 kg/m. Last weight:  Wt Readings from Last 3 Encounters:  01/29/23 265 lb (120.2 kg)  01/08/23 258 lb (117 kg)  12/15/22 262 lb (118.8 kg)     Physical Exam Constitutional:      Appearance: Normal appearance.  Cardiovascular:     Rate and Rhythm: Normal rate and regular rhythm.     Heart sounds: Normal heart sounds.  Pulmonary:     Effort: Pulmonary effort is normal.     Breath sounds: Normal breath sounds.  Musculoskeletal:     Right lower leg: No edema.     Left lower leg: No edema.  Neurological:     Mental Status: She is alert.       EKG:   Recent Labs: No results found for requested labs within last 365 days.    Lipid Panel    Component Value Date/Time   CHOL 108 06/25/2018 0442   CHOL 171 02/04/2015 0251   TRIG 27 06/25/2018 0442   TRIG 41 02/04/2015 0251   HDL 41 06/25/2018 0442   HDL 63 02/04/2015 0251   CHOLHDL 2.6 06/25/2018 0442   VLDL 5 06/25/2018 0442   VLDL 8 02/04/2015 0251   LDLCALC 62 06/25/2018 0442   LDLCALC 100 (H) 02/04/2015 0251      Other studies Reviewed: Additional studies/ records that were reviewed today include:  Review of the above records demonstrates:       No data to display            ASSESSMENT AND PLAN:    ICD-10-CM   1. Coronary artery disease of native artery of native heart with stable angina pectoris ([place])  I25.118     2. Chronic atrial fibrillation (HCC)  I48.20    still in atial fib with RVR, advise changing back to amiodrone 400 bid    3. Primary hypertension  I10         Problem List Items Addressed This Visit       Cardiovascular  and Mediastinum   CAD (coronary artery disease) - Primary   Relevant Medications   amiodarone (PACERONE) 400 MG tablet   HTN (hypertension)   Relevant Medications   amiodarone (PACERONE) 400 MG tablet   Chronic atrial fibrillation (HCC)   Relevant Medications   amiodarone (PACERONE) 400 MG tablet       Disposition:   Return in about 4 weeks (around 02/26/2023).    Total time spent: 30 minutes  Signed,  Neoma Laming, MD  01/29/2023 12:17 [place]

## 2023-01-29 NOTE — Patient Instructions (Signed)
Take amiodrone 400 mg twice a day

## 2023-02-10 ENCOUNTER — Ambulatory Visit: Payer: 59 | Attending: Family Medicine

## 2023-02-10 DIAGNOSIS — R262 Difficulty in walking, not elsewhere classified: Secondary | ICD-10-CM | POA: Insufficient documentation

## 2023-02-10 DIAGNOSIS — Z8673 Personal history of transient ischemic attack (TIA), and cerebral infarction without residual deficits: Secondary | ICD-10-CM | POA: Insufficient documentation

## 2023-02-10 DIAGNOSIS — R2689 Other abnormalities of gait and mobility: Secondary | ICD-10-CM | POA: Diagnosis present

## 2023-02-10 NOTE — Therapy (Signed)
OUTPATIENT PHYSICAL THERAPY NEURO EVALUATION   Patient Name: Carly Marks MRN: [id number] DOB:03-12-1956, 67 y.o., female Today's Date: 02/10/2023   PCP: Dr. Quay Burow REFERRING PROVIDER: Dr. Clide Deutscher  END OF SESSION:  PT End of Session - 02/10/23 1657     Visit Number 1    Number of Visits 10    Date for PT Re-Evaluation 03/24/23    Authorization Type 2x/week x 6 weeks, 12 visits requested    Progress Note Due on Visit 10    PT Start Time 1200    PT Stop Time 1245    PT Time Calculation (min) 45 min    Equipment Utilized During Treatment Gait belt    Activity Tolerance Patient tolerated treatment well    Behavior During Therapy Flat affect             Past Medical History:  Diagnosis Date   Anemia    Anxiety    Arthritis    Asthma    CAD (coronary artery disease)    per patient   Depression    Family history of colonic polyps    GERD (gastroesophageal reflux disease)    HA (headache)    HTN (hypertension)    Major depressive disorder, recurrent episode, moderate    Obstructive sleep apnea    Past Surgical History:  Procedure Laterality Date   CARDIAC CATHETERIZATION  02/19/15, 06/2009, 08/2011   COLONOSCOPY N/A 04/22/2015   Procedure: COLONOSCOPY;  Surgeon: Hulen Luster, MD;  Location: [facility] ENDOSCOPY;  Service: Gastroenterology;  Laterality: N/A;   COLONOSCOPY WITH PROPOFOL N/A 09/26/2021   Procedure: COLONOSCOPY WITH PROPOFOL;  Surgeon: [name], DO;  Location: [facility] ENDOSCOPY;  Service: Endoscopy;  Laterality: N/A;   TUBAL LIGATION     Patient Active Problem List   Diagnosis Date Noted   Chronic atrial fibrillation 01/08/2023   Dizziness 01/08/2023   Cerebrovascular accident (CVA) due to occlusion of left posterior cerebral artery 06/23/2019   Major neurocognitive disorder 04/24/2019   Seizure 08/23/2018   Stroke (cerebrum) 06/25/2018   CVA (cerebral vascular accident) 06/24/2018   Schizoaffective disorder, bipolar type 05/21/2017   Bipolar I  disorder, current or most recent episode manic, with psychotic features 11/21/2015   Anxiety 10/08/2015   Symptomatic bradycardia 10/08/2015   CAD (coronary artery disease) 04/16/2015   Chronic constipation 04/16/2015   HTN (hypertension) 04/16/2015   Arthritis, degenerative 04/16/2015   Allergic rhinitis, seasonal 04/16/2015   Avitaminosis D 04/16/2015   Depression, major, recurrent, moderate ([place]) 04/15/2015   Atypical chest pain 06/26/2013   Brash 06/26/2013   Arthritis 06/26/2013   Asthma, intermittent 01/17/2013   Airway hyperreactivity 07/20/2011   GERD (gastroesophageal reflux disease) 07/20/2011   HCD (hypertensive cardiovascular disease) 07/20/2011    ONSET DATE: chronic  REFERRING DIAG: Z86.73 (ICD-10-CM) - History of stroke   THERAPY DIAG:  Difficulty in walking, not elsewhere classified  History of stroke  Balance problem  Rationale for Evaluation and Treatment: Rehabilitation  SUBJECTIVE:  SUBJECTIVE STATEMENT: Pt states she is here to work on improving her walking.  PT then included pt's daughter Carly Marks) who is present to ask additional questions.  Pt's daughter states she noticed her mom's walking tolerance has declined; she is no longer able to walk from the car to inside a store to go shopping with her.  She is having some difficulty going up/down the stairs to enter her home; daughter stays close and holds on to patient for safety.    Pt accompanied by: family member daughter Carly Marks  PERTINENT HISTORY: h/o CVA in 2019.  No recent medical changes  PAIN:  Are you having pain? Pt reports yes, in L posterior hip/glute region.  PRECAUTIONS: Fall  WEIGHT BEARING RESTRICTIONS: No  FALLS: Has patient fallen in last 6 months? Yes, getting in/out of bed, no  injuries  LIVING ENVIRONMENT: Lives with: lives with their family Lives in: House/apartment Stairs: No none inside, but has stairs to enter with b/l railing Has following equipment at home: Single point cane and Walker - 4 wheeled  PLOF: Independent with household mobility with device  PATIENT GOALS: Pt improve her walking  OBJECTIVE:   COGNITION: Overall cognitive status:  Pt requires additional time to verbally express herself   SENSATION: Not tested  POSTURE: rounded shoulders  LOWER EXTREMITY ROM:     Functionally pt stands with b/l knees in slight flexion  LOWER EXTREMITY MMT:    MMT Right Eval Left Eval  Hip flexion 3+ 3+  Hip extension See 5x STS See 5x STS  Hip abduction    Hip adduction    Hip internal rotation    Hip external rotation    Knee flexion    Knee extension 4 4  Ankle dorsiflexion 4 4  Ankle plantarflexion    Ankle inversion    Ankle eversion    (Blank rows = not tested)  BED MOBILITY:  deferred TRANSFERS: Assistive device utilized: Single point cane  Sit to stand: Complete Independence requires use of b/l UE support on arm rests Stand to sit: Complete Independence Chair to chair: Complete Independence  GAIT: Gait pattern: decreased arm swing- Left, decreased stride length, and trunk flexed Distance walked: 40 ft with PT in/out of clinic Level of assistance: SBA Comments: PT with gait belt, SBA, pt amb with decreased gait speed, SPC (hurrycane type) and height of SPC is too tall  FUNCTIONAL TESTS:  OUTCOME MEASURES: TEST Outcome Interpretation  5 times sit<>stand 65 seconds >60 yo, >15 sec indicates increased risk for falls  TUG        24 seconds <14 sec indicates increased risk for falls  Berg Balance Assessment Deferred today <36/56 (100% risk for falls), 37-45 (80% risk for falls); 46-51 (>50% risk for falls); 52-55 (lower risk <25% of falls)  FOTO 45 Predicted discharge score of 56       PATIENT SURVEYS:  FOTO 45 today;  predicted 56 at DC  TODAY'S TREATMENT:  DATE: 02/10/23  Evaluation only today and assessed pt's vitals: BP 126/62, HR 71 bpm   PATIENT EDUCATION: Education details: PT POC/goals Person educated: Patient and Child(ren) Education method: Explanation Education comprehension: verbalized understanding  HOME EXERCISE PROGRAM: Will initiate at next visit  GOALS: Goals reviewed with patient? Yes  SHORT TERM GOALS: Target date: 02/17/23  Pt will demonstrate ability to amb with SPC at correct height for her body mechanics Baseline: 02/10/23 too tall Goal status: INITIAL    LONG TERM GOALS: Target date: 03/24/23  Improve TUG score to <14 sec indicating reduced risk for falls during ambulation Baseline: 24 seconds with SPC and PT SBA Goal status: INITIAL  2.  Improve 5x STS to <30 seconds indicating reduced risk for falls Baseline: 65 sec with b/l UE support Goal status: INITIAL  3.  Pt will be able to complete 6 min walk test to facilitate improved ability to amb from car in/out of a store with her daughter Baseline: will perform at future session Goal status: INITIAL    CLINICAL IMPRESSION: Patient is a 67 y.o. F who was seen today for physical therapy evaluation and treatment for difficulty walking.  She has a h/o previous CVA in 2019.  She is an appropriate candidate for a course out outpatient PT to address strength, balance, gait impairments today which are likely contributing to her difficulty walking and also her risk for falls.   OBJECTIVE IMPAIRMENTS: Abnormal gait, decreased activity tolerance, decreased balance, decreased cognition, difficulty walking, decreased ROM, decreased strength, and decreased safety awareness.   ACTIVITY LIMITATIONS: standing, stairs, transfers, dressing, and locomotion level  PARTICIPATION LIMITATIONS: cleaning, laundry,  shopping, and community activity  PERSONAL FACTORS: 3+ comorbidities: h/o CVA, anxiety, depression, cardiac hx, extensive medical history  are also affecting patient's functional outcome.   REHAB POTENTIAL: Good  CLINICAL DECISION MAKING: Stable/uncomplicated  EVALUATION COMPLEXITY: Low  PLAN:  PT FREQUENCY: 2x/week  PT DURATION: 6 weeks  PLANNED INTERVENTIONS: Therapeutic exercises, Therapeutic activity, Neuromuscular re-education, Balance training, Gait training, Patient/Family education, Self Care, and Joint mobilization  PLAN FOR NEXT SESSION: LE strengthening, gait training, balance exercises; work on training with rollator (she has one but prefers her [facility]), 2 min walk or 6 min walk test   Pincus Badder, PT 02/10/2023, 5:21 PM Merdis Delay, PT, DPT, OCS  [phone]

## 2023-02-15 ENCOUNTER — Encounter: Payer: Self-pay | Admitting: Cardiovascular Disease

## 2023-02-15 ENCOUNTER — Ambulatory Visit: Payer: 59

## 2023-02-15 ENCOUNTER — Ambulatory Visit (INDEPENDENT_AMBULATORY_CARE_PROVIDER_SITE_OTHER): Payer: 59 | Admitting: Cardiovascular Disease

## 2023-02-15 VITALS — BP 128/78 | HR 86 | Ht 66.0 in | Wt 269.0 lb

## 2023-02-15 DIAGNOSIS — I482 Chronic atrial fibrillation, unspecified: Secondary | ICD-10-CM | POA: Diagnosis not present

## 2023-02-15 DIAGNOSIS — I2510 Atherosclerotic heart disease of native coronary artery without angina pectoris: Secondary | ICD-10-CM | POA: Diagnosis not present

## 2023-02-15 NOTE — Progress Notes (Signed)
Cardiology Office Note   Date:  02/15/2023   ID:  Carly Marks, DOB 1956-04-15, MRN [id number]  PCP:  Hyman HopesBurns, Harriett P, MD  Cardiologist:  Adrian BlackwaterShaukat Leanda Padmore, MD      History of Present Illness: Carly SquiresBarbara A Marks is a 67 y.o. female who presents for  Chief Complaint  Patient presents with   Follow-up    2 month follow up    Patient in office for 2 month follow up.     Past Medical History:  Diagnosis Date   Anemia    Anxiety    Arthritis    Asthma    CAD (coronary artery disease)    per patient   Depression    Family history of colonic polyps    GERD (gastroesophageal reflux disease)    HA (headache)    HTN (hypertension)    Major depressive disorder, recurrent episode, moderate    Obstructive sleep apnea      Past Surgical History:  Procedure Laterality Date   CARDIAC CATHETERIZATION  02/19/15, 06/2009, 08/2011   COLONOSCOPY N/A 04/22/2015   Procedure: COLONOSCOPY;  Surgeon: Wallace CullensPaul Y Oh, MD;  Location: [facility] ENDOSCOPY;  Service: Gastroenterology;  Laterality: N/A;   COLONOSCOPY WITH PROPOFOL N/A 09/26/2021   Procedure: COLONOSCOPY WITH PROPOFOL;  Surgeon: Jaynie Collinsusso, Steven Michael, DO;  [facility];  Service: Endoscopy;  Laterality: N/A;   TUBAL LIGATION       Current Outpatient Medications  Medication Sig Dispense Refill   albuterol (VENTOLIN HFA) 108 (90 Base) MCG/ACT inhaler Inhale 2 puffs into the lungs every 6 (six) hours as needed.     amiodarone (PACERONE) 400 MG tablet Take 1 tablet (400 mg total) by mouth 2 (two) times daily. 60 tablet 0   amLODipine-benazepril (LOTREL) 5-10 MG capsule Take 1 capsule by mouth daily.     apixaban (ELIQUIS) 5 MG TABS tablet Take 1 tablet (5 mg total) by mouth 2 (two) times daily. 60 tablet 2   aspirin 81 MG tablet Take 81 mg by mouth daily.     atorvastatin (LIPITOR) 40 MG tablet Take 1 tablet (40 mg total) by mouth daily at 6 PM. 30 tablet 2   benztropine (COGENTIN) 0.5 MG tablet Take 0.5 mg by mouth daily.      cholecalciferol (VITAMIN D) 400 UNITS TABS tablet Take 400 Units by mouth daily.      clopidogrel (PLAVIX) 75 MG tablet Take 75 mg by mouth daily.     divalproex (DEPAKOTE) 250 MG DR tablet Take 3 tablets (750 mg total) by mouth 2 (two) times daily. 100 tablet 0   FLUoxetine (PROZAC) 20 MG capsule Take 3 capsules (60 mg total) by mouth daily. 90 capsule 3   fluticasone (FLONASE) 50 MCG/ACT nasal spray Place 2 sprays into the nose daily.     furosemide (LASIX) 20 MG tablet Take 20 mg by mouth daily.     LORazepam (ATIVAN) 1 MG tablet Take 1 tablet (1 mg total) by mouth 2 (two) times daily. 60 tablet 2   Melatonin 3 MG TABS Take 1 tablet (3 mg total) by mouth at bedtime. 30 tablet 1   metoprolol succinate (TOPROL-XL) 50 MG 24 hr tablet Take 1 tablet (50 mg total) by mouth daily. 30 tablet 2   OLANZapine (ZYPREXA) 5 MG tablet Take by mouth.     pantoprazole (PROTONIX) 20 MG tablet Take 20 mg by mouth daily.     solifenacin (VESICARE) 5 MG tablet Take 1 tablet (5 mg total)  by mouth daily. 90 tablet 3   SYMBICORT 80-4.5 MCG/ACT inhaler Inhale into the lungs.     traZODone (DESYREL) 50 MG tablet      No current facility-administered medications for this visit.    Allergies:   Codeine, Dextroamphetamine, Nsaids, Morphine and related, Penicillins, and Tolmetin    Social History:   reports that she has never smoked. She has never used smokeless tobacco. She reports that she does not drink alcohol and does not use drugs.   Family History:  family history includes Breast cancer (age of onset: 87) in her sister; Colon cancer in her maternal grandmother; Colon polyps in an other family member; Depression in an other family member; Diabetes in her maternal aunt and maternal uncle; Hypertension in her brother, mother, and sister.    ROS:     Review of Systems  Constitutional: Negative.   HENT: Negative.    Eyes: Negative.   Respiratory: Negative.    Cardiovascular: Negative.   Gastrointestinal:  Negative.   Genitourinary: Negative.   Musculoskeletal: Negative.   Skin: Negative.   Neurological: Negative.   Endo/Heme/Allergies: Negative.   Psychiatric/Behavioral: Negative.    All other systems reviewed and are negative.   All other systems are reviewed and negative.   PHYSICAL EXAM: VS:  BP 128/78   Pulse 86   Ht 5\' 6"  (1.676 m)   Wt 269 lb (122 kg)   SpO2 93%   BMI 43.42 kg/m  , BMI Body mass index is 43.42 kg/m. Last weight:  Wt Readings from Last 3 Encounters:  02/15/23 269 lb (122 kg)  01/29/23 265 lb (120.2 kg)  01/08/23 258 lb (117 kg)    Physical Exam Constitutional:      Appearance: Normal appearance.  Cardiovascular:     Rate and Rhythm: Normal rate and regular rhythm.     Heart sounds: Normal heart sounds.  Pulmonary:     Effort: Pulmonary effort is normal.     Breath sounds: Normal breath sounds.  Musculoskeletal:     Right lower leg: No edema.     Left lower leg: No edema.  Neurological:     Mental Status: She is alert.     EKG: none today  Recent Labs: No results found for requested labs within last 365 days.    Lipid Panel    Component Value Date/Time   CHOL 108 06/25/2018 0442   CHOL 171 02/04/2015 0251   TRIG 27 06/25/2018 0442   TRIG 41 02/04/2015 0251   HDL 41 06/25/2018 0442   HDL 63 02/04/2015 0251   CHOLHDL 2.6 06/25/2018 0442   VLDL 5 06/25/2018 0442   VLDL 8 02/04/2015 0251   LDLCALC 62 06/25/2018 0442   LDLCALC 100 (H) 02/04/2015 0251     Other studies Reviewed: none today   ASSESSMENT AND PLAN:    ICD-10-CM   1. Chronic atrial fibrillation  I48.20     2. Coronary artery disease involving native coronary artery of native heart without angina pectoris  I25.10        Problem List Items Addressed This Visit       Cardiovascular and Mediastinum   CAD (coronary artery disease)   Chronic atrial fibrillation - Primary    Patient doing well. Denies chest pain. HR improved on increased dose of amiodarone. Still  in atrial fibrillation.         Disposition:   Return in about 2 months (around 04/17/2023).    Total time spent: 30 minutes  Signed,  Adrian Blackwater, MD  02/15/2023 2:07 PM    [facility]

## 2023-02-15 NOTE — Assessment & Plan Note (Signed)
Patient doing well. Denies chest pain. HR improved on increased dose of amiodarone. Still in atrial fibrillation.

## 2023-02-17 ENCOUNTER — Ambulatory Visit: Payer: 59

## 2023-02-22 ENCOUNTER — Encounter: Payer: Self-pay | Admitting: Family Medicine

## 2023-02-24 ENCOUNTER — Ambulatory Visit: Payer: 59

## 2023-02-24 DIAGNOSIS — R262 Difficulty in walking, not elsewhere classified: Secondary | ICD-10-CM | POA: Diagnosis not present

## 2023-02-24 DIAGNOSIS — Z8673 Personal history of transient ischemic attack (TIA), and cerebral infarction without residual deficits: Secondary | ICD-10-CM

## 2023-02-24 DIAGNOSIS — R2689 Other abnormalities of gait and mobility: Secondary | ICD-10-CM

## 2023-02-24 NOTE — Therapy (Signed)
OUTPATIENT PHYSICAL THERAPY NEURO TREATMENT   Patient Name: Carly Marks MRN: [id number] DOB:May 03, 1956, 67 y.o., female Today's Date: 02/24/2023   PCP: Dr. Lawerance Bach REFERRING PROVIDER: Dr. Letta Pate  END OF SESSION:  PT End of Session - 02/24/23 1242     Visit Number 2    Number of Visits 10    Date for PT Re-Evaluation 03/24/23    Authorization Type 2x/week x 6 weeks, 12 visits requested    Progress Note Due on Visit 10    PT Start Time 1245    PT Stop Time 1345    PT Time Calculation (min) 60 min    Equipment Utilized During Treatment Gait belt    Activity Tolerance Patient tolerated treatment well    Behavior During Therapy Flat affect             Past Medical History:  Diagnosis Date   Anemia    Anxiety    Arthritis    Asthma    CAD (coronary artery disease)    per patient   Depression    Family history of colonic polyps    GERD (gastroesophageal reflux disease)    HA (headache)    HTN (hypertension)    Major depressive disorder, recurrent episode, moderate    Obstructive sleep apnea    Past Surgical History:  Procedure Laterality Date   CARDIAC CATHETERIZATION  02/19/15, 06/2009, 08/2011   COLONOSCOPY N/A 04/22/2015   Procedure: COLONOSCOPY;  Surgeon: Wallace Cullens, MD;  Location: [facility] ENDOSCOPY;  Service: Gastroenterology;  Laterality: N/A;   COLONOSCOPY WITH PROPOFOL N/A 09/26/2021   Procedure: COLONOSCOPY WITH PROPOFOL;  Surgeon: Jaynie Collins, DO;  Location: [facility] ENDOSCOPY;  Service: Endoscopy;  Laterality: N/A;   TUBAL LIGATION     Patient Active Problem List   Diagnosis Date Noted   Chronic atrial fibrillation 01/08/2023   Dizziness 01/08/2023   Cerebrovascular accident (CVA) due to occlusion of left posterior cerebral artery 06/23/2019   Major neurocognitive disorder 04/24/2019   Seizure 08/23/2018   Stroke (cerebrum) 06/25/2018   CVA (cerebral vascular accident) 06/24/2018   Schizoaffective disorder, bipolar type 05/21/2017   Bipolar I  disorder, current or most recent episode manic, with psychotic features 11/21/2015   Anxiety 10/08/2015   Symptomatic bradycardia 10/08/2015   CAD (coronary artery disease) 04/16/2015   Chronic constipation 04/16/2015   HTN (hypertension) 04/16/2015   Arthritis, degenerative 04/16/2015   Allergic rhinitis, seasonal 04/16/2015   Avitaminosis D 04/16/2015   Depression, major, recurrent, moderate (HCC) 04/15/2015   Atypical chest pain 06/26/2013   Brash 06/26/2013   Arthritis 06/26/2013   Asthma, intermittent 01/17/2013   Airway hyperreactivity 07/20/2011   GERD (gastroesophageal reflux disease) 07/20/2011   HCD (hypertensive cardiovascular disease) 07/20/2011    ONSET DATE: chronic  REFERRING DIAG: Z86.73 (ICD-10-CM) - History of stroke   THERAPY DIAG:  Difficulty in walking, not elsewhere classified  History of stroke  Balance problem  Rationale for Evaluation and Treatment: Rehabilitation  From initial evaluation note: SUBJECTIVE:  SUBJECTIVE STATEMENT: Pt states she is here to work on improving her walking.  PT then included pt's daughter Carly Marks) who is present to ask additional questions.  Pt's daughter states she noticed her mom's walking tolerance has declined; she is no longer able to walk from the car to inside a store to go shopping with her.  She is having some difficulty going up/down the stairs to enter her home; daughter stays close and holds on to patient for safety.    Pt accompanied by: family member daughter Carly Marks  PERTINENT HISTORY: h/o CVA in 2019.  No recent medical changes  PAIN:  Are you having pain? Pt reports yes, in L posterior hip/glute region.  PRECAUTIONS: Fall  WEIGHT BEARING RESTRICTIONS: No  FALLS: Has patient fallen in last 6 months? Yes, getting  in/out of bed, no injuries  LIVING ENVIRONMENT: Lives with: lives with their family Lives in: House/apartment Stairs: No none inside, but has stairs to enter with b/l railing Has following equipment at home: Single point cane and Walker - 4 wheeled  PLOF: Independent with household mobility with device  PATIENT GOALS: Pt improve her walking  OBJECTIVE:   COGNITION: Overall cognitive status:  Pt requires additional time to verbally express herself   SENSATION: Not tested  POSTURE: rounded shoulders  LOWER EXTREMITY ROM:     Functionally pt stands with b/l knees in slight flexion  LOWER EXTREMITY MMT:    MMT Right Eval Left Eval  Hip flexion 3+ 3+  Hip extension See 5x STS See 5x STS  Hip abduction    Hip adduction    Hip internal rotation    Hip external rotation    Knee flexion    Knee extension 4 4  Ankle dorsiflexion 4 4  Ankle plantarflexion    Ankle inversion    Ankle eversion    (Blank rows = not tested)  BED MOBILITY:  deferred TRANSFERS: Assistive device utilized: Single point cane  Sit to stand: Complete Independence requires use of b/l UE support on arm rests Stand to sit: Complete Independence Chair to chair: Complete Independence  GAIT: Gait pattern: decreased arm swing- Left, decreased stride length, and trunk flexed Distance walked: 40 ft with PT in/out of clinic Level of assistance: SBA Comments: PT with gait belt, SBA, pt amb with decreased gait speed, SPC (hurrycane type) and height of SPC is too tall  FUNCTIONAL TESTS:  OUTCOME MEASURES: TEST Outcome Interpretation  5 times sit<>stand 65 seconds >60 yo, >15 sec indicates increased risk for falls  TUG        24 seconds <14 sec indicates increased risk for falls  Berg Balance Assessment Deferred today <36/56 (100% risk for falls), 37-45 (80% risk for falls); 46-51 (>50% risk for falls); 52-55 (lower risk <25% of falls)  FOTO 45 Predicted discharge score of 56       PATIENT SURVEYS:   FOTO 45 today; predicted 56 at DC  TODAY'S TREATMENT:  DATE: 02/24/23  Subjective: Pt reports she is went to [facility] this past weekend with her family.  She reports a slow controlled "fall" 1x while she was there walking to her room. Pt's daughter states she was with her and that pt "slid down" and after she was rested she helped her mom get back up.  Both denied any injuries or pain from the incident.  Pt's daughter tried to reinforce that pt after this incident that PT is important to help her work on getting stronger.   Treatment: Therapeutic Exercises: Nustep x 7 min, level 2 seat #12 Standing with b/l UE support: heel raises 2x10, alt LE marches x10 Brief sitting break as pt reports she was tired after these standing exercises  PT donned gait belt and SBA throughout standing exercises Seated exercises: Seated knee extension 2x10 2# Seated marches 2x10 2# Sit to stand 2x5 (with b/l UE support) RPE 5/10 Brief sitting break as pt reports being tired after these exercises  Parallel bars: Amb forward 2 laps with UE support Side step x 1 lap with heavy verbal cues for positioning and technique Amb 1 lap in hallway to end Pt notes feeling tired at end of session and that this is the most exercise she has done in awhile.  Vitals at end: HR 93, 02 sat 98%   PATIENT EDUCATION: Education details: PT POC/goals Person educated: Patient and Child(ren) Education method: Explanation Education comprehension: verbalized understanding  HOME EXERCISE PROGRAM: Will initiate at next visit  GOALS: Goals reviewed with patient? Yes  SHORT TERM GOALS: Target date: 02/17/23  Pt will demonstrate ability to amb with SPC at correct height for her body mechanics Baseline: 02/10/23 too tall Goal status: INITIAL    LONG TERM GOALS: Target date: 03/24/23  Improve TUG  score to <14 sec indicating reduced risk for falls during ambulation Baseline: 24 seconds with SPC and PT SBA Goal status: INITIAL  2.  Improve 5x STS to <30 seconds indicating reduced risk for falls Baseline: 65 sec with b/l UE support Goal status: INITIAL  3.  Pt will be able to complete 6 min walk test to facilitate improved ability to amb from car in/out of a store with her daughter Baseline: will perform at future session Goal status: INITIAL    CLINICAL IMPRESSION: Patient amb with SPC upon arrival and during session; she amb with decreased gait speed and inconsistent step length with SPC and demonstrates limited walking tolerance with her [facility]; asked her to bring her rollator with her at next session to assess her gait with this AD.  She required 3 brief sitting rest breaks throughout session to complete LE strengthening.  Requires verbal cues for less familiar movement patterns and instructions throughout session. She is an appropriate candidate for a course out outpatient PT to address strength, balance, gait impairments today which are likely contributing to her difficulty walking and also her risk for falls.   OBJECTIVE IMPAIRMENTS: Abnormal gait, decreased activity tolerance, decreased balance, decreased cognition, difficulty walking, decreased ROM, decreased strength, and decreased safety awareness.   ACTIVITY LIMITATIONS: standing, stairs, transfers, dressing, and locomotion level  PARTICIPATION LIMITATIONS: cleaning, laundry, shopping, and community activity  PERSONAL FACTORS: 3+ comorbidities: h/o CVA, anxiety, depression, cardiac hx, extensive medical history  are also affecting patient's functional outcome.   REHAB POTENTIAL: Good  CLINICAL DECISION MAKING: Stable/uncomplicated  EVALUATION COMPLEXITY: Low  PLAN:  PT FREQUENCY: 2x/week  PT DURATION: 6 weeks  PLANNED INTERVENTIONS: Therapeutic exercises, Therapeutic activity, Neuromuscular re-education,  Balance  training, Gait training, Patient/Family education, Self Care, and Joint mobilization  PLAN FOR NEXT SESSION: LE strengthening, gait training, balance exercises; work on training with rollator (she has one but prefers her [facility]), 2 min walk or 6 min walk test; asked her to bring rollator with her next visit   Ardine Bjork, PT 02/24/2023, 12:43 PM Max Fickle, PT, DPT, OCS  [phone]

## 2023-03-03 ENCOUNTER — Ambulatory Visit: Payer: 59

## 2023-03-03 DIAGNOSIS — R262 Difficulty in walking, not elsewhere classified: Secondary | ICD-10-CM | POA: Diagnosis not present

## 2023-03-03 DIAGNOSIS — Z8673 Personal history of transient ischemic attack (TIA), and cerebral infarction without residual deficits: Secondary | ICD-10-CM

## 2023-03-03 NOTE — Therapy (Signed)
OUTPATIENT PHYSICAL THERAPY NEURO TREATMENT   Patient Name: Carly Marks MRN: [id number] DOB:21-Feb-1956, 67 y.o., female Today's Date: 03/03/2023   PCP: Dr. Lawerance Bach REFERRING PROVIDER: Dr. Letta Pate  END OF SESSION:  PT End of Session - 03/03/23 1637     Visit Number 3    Number of Visits 10    Date for PT Re-Evaluation 03/24/23    Authorization Type 2x/week x 6 weeks, 12 visits requested    Progress Note Due on Visit 10    PT Start Time 1635    PT Stop Time 1715    PT Time Calculation (min) 40 min    Equipment Utilized During Treatment Gait belt    Activity Tolerance Patient tolerated treatment well    Behavior During Therapy Flat affect             Past Medical History:  Diagnosis Date   Anemia    Anxiety    Arthritis    Asthma    CAD (coronary artery disease)    per patient   Depression    Family history of colonic polyps    GERD (gastroesophageal reflux disease)    HA (headache)    HTN (hypertension)    Major depressive disorder, recurrent episode, moderate    Obstructive sleep apnea    Past Surgical History:  Procedure Laterality Date   CARDIAC CATHETERIZATION  02/19/15, 06/2009, 08/2011   COLONOSCOPY N/A 04/22/2015   Procedure: COLONOSCOPY;  Surgeon: Carly Cullens, MD;  Location: [facility] ENDOSCOPY;  Service: Gastroenterology;  Laterality: N/A;   COLONOSCOPY WITH PROPOFOL N/A 09/26/2021   Procedure: COLONOSCOPY WITH PROPOFOL;  Surgeon: Carly Collins, DO;  Location: [facility] ENDOSCOPY;  Service: Endoscopy;  Laterality: N/A;   TUBAL LIGATION     Patient Active Problem List   Diagnosis Date Noted   Chronic atrial fibrillation 01/08/2023   Dizziness 01/08/2023   Cerebrovascular accident (CVA) due to occlusion of left posterior cerebral artery 06/23/2019   Major neurocognitive disorder 04/24/2019   Seizure 08/23/2018   Stroke (cerebrum) 06/25/2018   CVA (cerebral vascular accident) 06/24/2018   Schizoaffective disorder, bipolar type 05/21/2017   Bipolar I  disorder, current or most recent episode manic, with psychotic features 11/21/2015   Anxiety 10/08/2015   Symptomatic bradycardia 10/08/2015   CAD (coronary artery disease) 04/16/2015   Chronic constipation 04/16/2015   HTN (hypertension) 04/16/2015   Arthritis, degenerative 04/16/2015   Allergic rhinitis, seasonal 04/16/2015   Avitaminosis D 04/16/2015   Depression, major, recurrent, moderate (HCC) 04/15/2015   Atypical chest pain 06/26/2013   Brash 06/26/2013   Arthritis 06/26/2013   Asthma, intermittent 01/17/2013   Airway hyperreactivity 07/20/2011   GERD (gastroesophageal reflux disease) 07/20/2011   HCD (hypertensive cardiovascular disease) 07/20/2011    ONSET DATE: chronic  REFERRING DIAG: Z86.73 (ICD-10-CM) - History of stroke   THERAPY DIAG:  Difficulty in walking, not elsewhere classified  History of stroke  Rationale for Evaluation and Treatment: Rehabilitation  From initial evaluation note: SUBJECTIVE:  SUBJECTIVE STATEMENT: Pt states she is here to work on improving her walking.  PT then included pt's daughter Carly Marks) who is present to ask additional questions.  Pt's daughter states she noticed her mom's walking tolerance has declined; she is no longer able to walk from the car to inside a store to go shopping with her.  She is having some difficulty going up/down the stairs to enter her home; daughter stays close and holds on to patient for safety.    Pt accompanied by: family member daughter Carly Marks  PERTINENT HISTORY: h/o CVA in 2019.  No recent medical changes  PAIN:  Are you having pain? Pt reports yes, in L posterior hip/glute region.  PRECAUTIONS: Fall  WEIGHT BEARING RESTRICTIONS: No  FALLS: Has patient fallen in last 6 months? Yes, getting in/out of bed, no  injuries  LIVING ENVIRONMENT: Lives with: lives with their family Lives in: House/apartment Stairs: No none inside, but has stairs to enter with b/l railing Has following equipment at home: Single point cane and Walker - 4 wheeled  PLOF: Independent with household mobility with device  PATIENT GOALS: Pt improve her walking  OBJECTIVE:   COGNITION: Overall cognitive status:  Pt requires additional time to verbally express herself   SENSATION: Not tested  POSTURE: rounded shoulders  LOWER EXTREMITY ROM:     Functionally pt stands with b/l knees in slight flexion  LOWER EXTREMITY MMT:    MMT Right Eval Left Eval  Hip flexion 3+ 3+  Hip extension See 5x STS See 5x STS  Hip abduction    Hip adduction    Hip internal rotation    Hip external rotation    Knee flexion    Knee extension 4 4  Ankle dorsiflexion 4 4  Ankle plantarflexion    Ankle inversion    Ankle eversion    (Blank rows = not tested)  BED MOBILITY:  deferred TRANSFERS: Assistive device utilized: Single point cane  Sit to stand: Complete Independence requires use of b/l UE support on arm rests Stand to sit: Complete Independence Chair to chair: Complete Independence  GAIT: Gait pattern: decreased arm swing- Left, decreased stride length, and trunk flexed Distance walked: 40 ft with PT in/out of clinic Level of assistance: SBA Comments: PT with gait belt, SBA, pt amb with decreased gait speed, SPC (hurrycane type) and height of SPC is too tall  FUNCTIONAL TESTS:  OUTCOME MEASURES: TEST Outcome Interpretation  5 times sit<>stand 65 seconds >60 yo, >15 sec indicates increased risk for falls  TUG        24 seconds <14 sec indicates increased risk for falls  Berg Balance Assessment Deferred today <36/56 (100% risk for falls), 37-45 (80% risk for falls); 46-51 (>50% risk for falls); 52-55 (lower risk <25% of falls)  FOTO 45 Predicted discharge score of 56       PATIENT SURVEYS:  FOTO 45 today;  predicted 56 at DC  TODAY'S TREATMENT:  DATE: 02/24/23  Subjective: Pt reports she is went to [facility] this past weekend with her family.  She reports a slow controlled "fall" 1x while she was there walking to her room. Pt's daughter states she was with her and that pt "slid down" and after she was rested she helped her mom get back up.  Both denied any injuries or pain from the incident.  Pt's daughter tried to reinforce that pt after this incident that PT is important to help her work on getting stronger.   Treatment: Therapeutic Exercises: Nustep x 7 min, level 2 seat #12 Standing with b/l UE support: heel raises 2x10, alt LE marches x10 Brief sitting break as pt reports she was tired after these standing exercises  PT donned gait belt and SBA throughout standing exercises Seated exercises: Seated knee extension 2x10 2# Seated marches 2x10 2# Sit to stand 2x5 (with b/l UE support) RPE 5/10 Seated hip add with ball: x15  Parallel bars: Amb forward 2 laps with UE support Side step x 1 lap with heavy verbal cues for positioning and technique Amb 1 lap in hallway to end Pt notes feeling tired at end of session and that this is the most exercise she has done in awhile.  Vitals at end: HR 93, 02 sat 98%   PATIENT EDUCATION: Education details: PT POC/goals Person educated: Patient and Child(ren) Education method: Explanation Education comprehension: verbalized understanding  HOME EXERCISE PROGRAM: Will initiate at next visit  GOALS: Goals reviewed with patient? Yes  SHORT TERM GOALS: Target date: 02/17/23  Pt will demonstrate ability to amb with SPC at correct height for her body mechanics Baseline: 02/10/23 too tall Goal status: INITIAL    LONG TERM GOALS: Target date: 03/24/23  Improve TUG score to <14 sec indicating reduced risk for falls during  ambulation Baseline: 24 seconds with SPC and PT SBA Goal status: INITIAL  2.  Improve 5x STS to <30 seconds indicating reduced risk for falls Baseline: 65 sec with b/l UE support Goal status: INITIAL  3.  Pt will be able to complete 6 min walk test to facilitate improved ability to amb from car in/out of a store with her daughter Baseline: will perform at future session Goal status: INITIAL    CLINICAL IMPRESSION: Patient amb with SPC upon arrival and during session; she amb with decreased gait speed and inconsistent step length with SPC and demonstrates limited walking tolerance with her [facility]; asked her to bring her rollator with her at next session to assess her gait with this AD.  She required 3 brief sitting rest breaks throughout session to complete LE strengthening.  Requires verbal cues for less familiar movement patterns and instructions throughout session. She is an appropriate candidate for a course out outpatient PT to address strength, balance, gait impairments today which are likely contributing to her difficulty walking and also her risk for falls.   OBJECTIVE IMPAIRMENTS: Abnormal gait, decreased activity tolerance, decreased balance, decreased cognition, difficulty walking, decreased ROM, decreased strength, and decreased safety awareness.   ACTIVITY LIMITATIONS: standing, stairs, transfers, dressing, and locomotion level  PARTICIPATION LIMITATIONS: cleaning, laundry, shopping, and community activity  PERSONAL FACTORS: 3+ comorbidities: h/o CVA, anxiety, depression, cardiac hx, extensive medical history  are also affecting patient's functional outcome.   REHAB POTENTIAL: Good  CLINICAL DECISION MAKING: Stable/uncomplicated  EVALUATION COMPLEXITY: Low  PLAN:  PT FREQUENCY: 2x/week  PT DURATION: 6 weeks  PLANNED INTERVENTIONS: Therapeutic exercises, Therapeutic activity, Neuromuscular re-education, Balance training, Gait training, Patient/Family  education, Self Care,  and Joint mobilization  PLAN FOR NEXT SESSION: LE strengthening, gait training, balance exercises; work on training with rollator (she has one but prefers her [facility]), 2 min walk or 6 min walk test; asked her to bring rollator with her next visit   Ardine Bjork, PT 03/03/2023, 5:29 PM Max Fickle, PT, DPT, OCS  [phone]

## 2023-03-03 NOTE — Therapy (Signed)
OUTPATIENT PHYSICAL THERAPY NEURO TREATMENT   Patient Name: Carly Marks MRN: [id number] DOB:21-Feb-1956, 67 y.o., female Today's Date: 03/03/2023   PCP: Dr. Lawerance Bach REFERRING PROVIDER: Dr. Letta Pate  END OF SESSION:  PT End of Session - 03/03/23 1637     Visit Number 3    Number of Visits 10    Date for PT Re-Evaluation 03/24/23    Authorization Type 2x/week x 6 weeks, 12 visits requested    Progress Note Due on Visit 10    PT Start Time 1635    PT Stop Time 1715    PT Time Calculation (min) 40 min    Equipment Utilized During Treatment Gait belt    Activity Tolerance Patient tolerated treatment well    Behavior During Therapy Flat affect             Past Medical History:  Diagnosis Date   Anemia    Anxiety    Arthritis    Asthma    CAD (coronary artery disease)    per patient   Depression    Family history of colonic polyps    GERD (gastroesophageal reflux disease)    HA (headache)    HTN (hypertension)    Major depressive disorder, recurrent episode, moderate    Obstructive sleep apnea    Past Surgical History:  Procedure Laterality Date   CARDIAC CATHETERIZATION  02/19/15, 06/2009, 08/2011   COLONOSCOPY N/A 04/22/2015   Procedure: COLONOSCOPY;  Surgeon: Wallace Cullens, MD;  Location: [facility] ENDOSCOPY;  Service: Gastroenterology;  Laterality: N/A;   COLONOSCOPY WITH PROPOFOL N/A 09/26/2021   Procedure: COLONOSCOPY WITH PROPOFOL;  Surgeon: Jaynie Collins, DO;  Location: [facility] ENDOSCOPY;  Service: Endoscopy;  Laterality: N/A;   TUBAL LIGATION     Patient Active Problem List   Diagnosis Date Noted   Chronic atrial fibrillation 01/08/2023   Dizziness 01/08/2023   Cerebrovascular accident (CVA) due to occlusion of left posterior cerebral artery 06/23/2019   Major neurocognitive disorder 04/24/2019   Seizure 08/23/2018   Stroke (cerebrum) 06/25/2018   CVA (cerebral vascular accident) 06/24/2018   Schizoaffective disorder, bipolar type 05/21/2017   Bipolar I  disorder, current or most recent episode manic, with psychotic features 11/21/2015   Anxiety 10/08/2015   Symptomatic bradycardia 10/08/2015   CAD (coronary artery disease) 04/16/2015   Chronic constipation 04/16/2015   HTN (hypertension) 04/16/2015   Arthritis, degenerative 04/16/2015   Allergic rhinitis, seasonal 04/16/2015   Avitaminosis D 04/16/2015   Depression, major, recurrent, moderate (HCC) 04/15/2015   Atypical chest pain 06/26/2013   Brash 06/26/2013   Arthritis 06/26/2013   Asthma, intermittent 01/17/2013   Airway hyperreactivity 07/20/2011   GERD (gastroesophageal reflux disease) 07/20/2011   HCD (hypertensive cardiovascular disease) 07/20/2011    ONSET DATE: chronic  REFERRING DIAG: Z86.73 (ICD-10-CM) - History of stroke   THERAPY DIAG:  Difficulty in walking, not elsewhere classified  History of stroke  Rationale for Evaluation and Treatment: Rehabilitation  From initial evaluation note: SUBJECTIVE:  SUBJECTIVE STATEMENT: Pt states she is here to work on improving her walking.  PT then included pt's daughter [name]) who is present to ask additional questions.  Pt's daughter states she noticed her mom's walking tolerance has declined; she is no longer able to walk from the car to inside a store to go shopping with her.  She is having some difficulty going up/down the stairs to enter her home; daughter stays close and holds on to patient for safety.    Pt accompanied by: family member daughter [name]  PERTINENT HISTORY: h/o CVA in 2019.  No recent medical changes  PAIN:  Are you having pain? Pt reports yes, in L posterior hip/glute region.  PRECAUTIONS: Fall  WEIGHT BEARING RESTRICTIONS: No  FALLS: Has patient fallen in last 6 months? Yes, getting in/out of bed, no  injuries  LIVING ENVIRONMENT: Lives with: lives with their family Lives in: House/apartment Stairs: No none inside, but has stairs to enter with b/l railing Has following equipment at home: Single point cane and Walker - 4 wheeled  PLOF: Independent with household mobility with device  PATIENT GOALS: Pt improve her walking  OBJECTIVE:   COGNITION: Overall cognitive status:  Pt requires additional time to verbally express herself   SENSATION: Not tested  POSTURE: rounded shoulders  LOWER EXTREMITY ROM:     Functionally pt stands with b/l knees in slight flexion  LOWER EXTREMITY MMT:    MMT Right Eval Left Eval  Hip flexion 3+ 3+  Hip extension See 5x STS See 5x STS  Hip abduction    Hip adduction    Hip internal rotation    Hip external rotation    Knee flexion    Knee extension 4 4  Ankle dorsiflexion 4 4  Ankle plantarflexion    Ankle inversion    Ankle eversion    (Blank rows = not tested)  BED MOBILITY:  deferred TRANSFERS: Assistive device utilized: Single point cane  Sit to stand: Complete Independence requires use of b/l UE support on arm rests Stand to sit: Complete Independence Chair to chair: Complete Independence  GAIT: Gait pattern: decreased arm swing- Left, decreased stride length, and trunk flexed Distance walked: 40 ft with PT in/out of clinic Level of assistance: SBA Comments: PT with gait belt, SBA, pt amb with decreased gait speed, SPC (hurrycane type) and height of SPC is too tall  FUNCTIONAL TESTS:  OUTCOME MEASURES: TEST Outcome Interpretation  5 times sit<>stand 65 seconds >60 yo, >15 sec indicates increased risk for falls  TUG        24 seconds <14 sec indicates increased risk for falls  Berg Balance Assessment Deferred today <36/56 (100% risk for falls), 37-45 (80% risk for falls); 46-51 (>50% risk for falls); 52-55 (lower risk <25% of falls)  FOTO 45 Predicted discharge score of 56       PATIENT SURVEYS:  FOTO 45 today;  predicted 56 at DC  TODAY'S TREATMENT:  DATE: 02/24/23  Subjective: Pt reports no falls occurred this week. She got new teeth yesterday and is adjusting to them (full upper denture).  Her energy level is medium upon arrival.  Pt's daughter stated the rollator did not fit in car so was not able to bring it today.  Treatment: Therapeutic Exercises: Nustep x 7 min, level 2 seat #12- not today Standing with b/l UE support: heel raises 2x10, alt LE marches x10- not today   PT donned gait belt and SBA throughout standing exercises Seated exercises: Seated knee extension 2x10 2# Seated marches 2x10 2# Sit to stand 2x5 (with b/l UE support) RPE 5/10   Parallel bars: not today Amb forward 2 laps with UE support Side step x 1 lap with heavy verbal cues for positioning and technique  Amb 50 ft then sitting break x 1 min then 50 ft and sitting break with SPC (PT SBA with gait belt) Amb 50 ft then sitting break x 1 min then 50 ft and sitting break with rollator (PT SBA with gait belt) Amb from clinic to car at end of session with rollator x 75 ft.  Vitals during session: HR 95, 02 sat 98%   PATIENT EDUCATION: Education details: PT POC/goals Person educated: Patient and Child(ren) Education method: Explanation Education comprehension: verbalized understanding  HOME EXERCISE PROGRAM: Access Code: [email or web address] URL: [email or web address] Date: 03/03/2023 Prepared by: Max Fickle  Exercises - Seated March  - 1 x daily - 7 x weekly - 2 sets - 10 reps - Seated Long Arc Quad  - 1 x daily - 7 x weekly - 2 sets - 10 reps - Sit to Stand with Counter Support  - 1 x daily - 7 x weekly - 2 sets - 5 reps  GOALS: Goals reviewed with patient? Yes  SHORT TERM GOALS: Target date: 02/17/23  Pt will demonstrate ability to amb with SPC at correct height for her  body mechanics Baseline: 02/10/23 too tall Goal status: INITIAL    LONG TERM GOALS: Target date: 03/24/23  Improve TUG score to <14 sec indicating reduced risk for falls during ambulation Baseline: 24 seconds with SPC and PT SBA Goal status: INITIAL  2.  Improve 5x STS to <30 seconds indicating reduced risk for falls Baseline: 65 sec with b/l UE support Goal status: INITIAL  3.  Pt will be able to complete 6 min walk test to facilitate improved ability to amb from car in/out of a store with her daughter Baseline: will perform at future session Goal status: INITIAL    CLINICAL IMPRESSION: Patient amb with improved gait speed and more symmetrical step length using rollator vs SPC today.  Also able to amb longer distances with rollator.  Discussed with pt's daughter that it would be helpful if pt could practice more with rollator at home instead of [facility] as this should help improve her walking endurance as it allows her to practice walking longer distances, with a safer gait pattern, and at a slightly faster pace.  Initiated a safe seated HEP for pt to perform at home on the days she is not at PT.  She is an appropriate candidate for a course out outpatient PT to address strength, balance, gait impairments today which are likely contributing to her difficulty walking and also her risk for falls.   OBJECTIVE IMPAIRMENTS: Abnormal gait, decreased activity tolerance, decreased balance, decreased cognition, difficulty walking, decreased ROM, decreased strength, and decreased safety awareness.   ACTIVITY LIMITATIONS: standing, stairs, transfers, dressing, and  locomotion level  PARTICIPATION LIMITATIONS: cleaning, laundry, shopping, and community activity  PERSONAL FACTORS: 3+ comorbidities: h/o CVA, anxiety, depression, cardiac hx, extensive medical history  are also affecting patient's functional outcome.   REHAB POTENTIAL: Good  CLINICAL DECISION MAKING: Stable/uncomplicated  EVALUATION  COMPLEXITY: Low  PLAN:  PT FREQUENCY: 2x/week  PT DURATION: 6 weeks  PLANNED INTERVENTIONS: Therapeutic exercises, Therapeutic activity, Neuromuscular re-education, Balance training, Gait training, Patient/Family education, Self Care, and Joint mobilization  PLAN FOR NEXT SESSION: LE strengthening, gait training, balance exercises; work on training with rollator (she has one but prefers her [facility]), 2 min walk or 6 min walk test   Ardine Bjork, PT 03/03/2023, 5:29 PM Max Fickle, PT, DPT, OCS  [phone]

## 2023-03-10 ENCOUNTER — Ambulatory Visit: Payer: 59 | Attending: Family Medicine

## 2023-03-10 DIAGNOSIS — R2689 Other abnormalities of gait and mobility: Secondary | ICD-10-CM | POA: Diagnosis present

## 2023-03-10 DIAGNOSIS — Z8673 Personal history of transient ischemic attack (TIA), and cerebral infarction without residual deficits: Secondary | ICD-10-CM | POA: Diagnosis present

## 2023-03-10 DIAGNOSIS — R262 Difficulty in walking, not elsewhere classified: Secondary | ICD-10-CM | POA: Diagnosis present

## 2023-03-10 NOTE — Therapy (Signed)
OUTPATIENT PHYSICAL THERAPY NEURO TREATMENT   Patient Name: Carly Marks MRN: [id number] DOB:Sep 20, 1956, 67 y.o., female Today's Date: 03/10/2023   PCP: Dr. Lawerance Bach REFERRING PROVIDER: Dr. Letta Pate  END OF SESSION:  PT End of Session - 03/10/23 1417     Visit Number 4    Number of Visits 10    Date for PT Re-Evaluation 03/24/23    Authorization Type 2x/week x 6 weeks, 12 visits requested    Progress Note Due on Visit 10    PT Start Time 1420    PT Stop Time 1505    PT Time Calculation (min) 45 min    Equipment Utilized During Treatment Gait belt    Activity Tolerance Patient tolerated treatment well    Behavior During Therapy Flat affect             Past Medical History:  Diagnosis Date   Anemia    Anxiety    Arthritis    Asthma    CAD (coronary artery disease)    per patient   Depression    Family history of colonic polyps    GERD (gastroesophageal reflux disease)    HA (headache)    HTN (hypertension)    Major depressive disorder, recurrent episode, moderate (HCC)    Obstructive sleep apnea    Past Surgical History:  Procedure Laterality Date   CARDIAC CATHETERIZATION  02/19/15, 06/2009, 08/2011   COLONOSCOPY N/A 04/22/2015   Procedure: COLONOSCOPY;  Surgeon: Wallace Cullens, MD;  Location: [facility] ENDOSCOPY;  Service: Gastroenterology;  Laterality: N/A;   COLONOSCOPY WITH PROPOFOL N/A 09/26/2021   Procedure: COLONOSCOPY WITH PROPOFOL;  Surgeon: Jaynie Collins, DO;  Location: [facility] ENDOSCOPY;  Service: Endoscopy;  Laterality: N/A;   TUBAL LIGATION     Patient Active Problem List   Diagnosis Date Noted   Chronic atrial fibrillation (HCC) 01/08/2023   Dizziness 01/08/2023   Cerebrovascular accident (CVA) due to occlusion of left posterior cerebral artery (HCC) 06/23/2019   Major neurocognitive disorder (HCC) 04/24/2019   Seizure (HCC) 08/23/2018   Stroke (cerebrum) (HCC) 06/25/2018   CVA (cerebral vascular accident) (HCC) 06/24/2018   Schizoaffective  disorder, bipolar type (HCC) 05/21/2017   Bipolar I disorder, current or most recent episode manic, with psychotic features (HCC) 11/21/2015   Anxiety 10/08/2015   Symptomatic bradycardia 10/08/2015   CAD (coronary artery disease) 04/16/2015   Chronic constipation 04/16/2015   HTN (hypertension) 04/16/2015   Arthritis, degenerative 04/16/2015   Allergic rhinitis, seasonal 04/16/2015   Avitaminosis D 04/16/2015   Depression, major, recurrent, moderate (HCC) 04/15/2015   Atypical chest pain 06/26/2013   Brash 06/26/2013   Arthritis 06/26/2013   Asthma, intermittent 01/17/2013   Airway hyperreactivity 07/20/2011   GERD (gastroesophageal reflux disease) 07/20/2011   HCD (hypertensive cardiovascular disease) 07/20/2011    ONSET DATE: chronic  REFERRING DIAG: Z86.73 (ICD-10-CM) - History of stroke   THERAPY DIAG:  Difficulty in walking, not elsewhere classified  History of stroke  Balance problem  Rationale for Evaluation and Treatment: Rehabilitation  From initial evaluation note: SUBJECTIVE:  SUBJECTIVE STATEMENT: Pt states she is here to work on improving her walking.  PT then included pt's daughter Carly Marks) who is present to ask additional questions.  Pt's daughter states she noticed her mom's walking tolerance has declined; she is no longer able to walk from the car to inside a store to go shopping with her.  She is having some difficulty going up/down the stairs to enter her home; daughter stays close and holds on to patient for safety.    Pt accompanied by: family member daughter Carly Marks  PERTINENT HISTORY: h/o CVA in 2019.  No recent medical changes  PAIN:  Are you having pain? Pt reports yes, in L posterior hip/glute region.  PRECAUTIONS: Fall  WEIGHT BEARING RESTRICTIONS:  No  FALLS: Has patient fallen in last 6 months? Yes, getting in/out of bed, no injuries  LIVING ENVIRONMENT: Lives with: lives with their family Lives in: House/apartment Stairs: No none inside, but has stairs to enter with b/l railing Has following equipment at home: Single point cane and Walker - 4 wheeled  PLOF: Independent with household mobility with device  PATIENT GOALS: Pt improve her walking  OBJECTIVE:   COGNITION: Overall cognitive status:  Pt requires additional time to verbally express herself   SENSATION: Not tested  POSTURE: rounded shoulders  LOWER EXTREMITY ROM:     Functionally pt stands with b/l knees in slight flexion  LOWER EXTREMITY MMT:    MMT Right Eval Left Eval  Hip flexion 3+ 3+  Hip extension See 5x STS See 5x STS  Hip abduction    Hip adduction    Hip internal rotation    Hip external rotation    Knee flexion    Knee extension 4 4  Ankle dorsiflexion 4 4  Ankle plantarflexion    Ankle inversion    Ankle eversion    (Blank rows = not tested)  BED MOBILITY:  deferred TRANSFERS: Assistive device utilized: Single point cane  Sit to stand: Complete Independence requires use of b/l UE support on arm rests Stand to sit: Complete Independence Chair to chair: Complete Independence  GAIT: Gait pattern: decreased arm swing- Left, decreased stride length, and trunk flexed Distance walked: 40 ft with PT in/out of clinic Level of assistance: SBA Comments: PT with gait belt, SBA, pt amb with decreased gait speed, SPC (hurrycane type) and height of SPC is too tall  FUNCTIONAL TESTS:  OUTCOME MEASURES: TEST Outcome Interpretation  5 times sit<>stand 65 seconds >60 yo, >15 sec indicates increased risk for falls  TUG        24 seconds <14 sec indicates increased risk for falls  Berg Balance Assessment Deferred today <36/56 (100% risk for falls), 37-45 (80% risk for falls); 46-51 (>50% risk for falls); 52-55 (lower risk <25% of falls)  FOTO  45 Predicted discharge score of 56       PATIENT SURVEYS:  FOTO 45 today; predicted 56 at DC  TODAY'S TREATMENT:  DATE: 02/24/23  Subjective: Pt reports she did not have any falls this week.  She reports she worked on her HEP.  She did a little walking with her rollator.    Treatment: Therapeutic Exercises: In parallel bars (gait belt, PT SBA) Standing with b/l UE support: heel raises 2x10, alt LE marches x10 (heavy UE reliance) Standing marches 2x30 seconds Standing alternating side steps 2x 30 seconds  Sit to stand 5 reps x 2 sets (pt uses b/l UE support) Nustep x 7 min, level 2 seat #12, goal SPM>45 (pt requires frequent verbal cues for pacing  Vitals at stop: HR 96 bpm, SPO2 98%  Seated exercises: (not today) Seated knee extension 2x10 2# Seated marches 2x10 2#   Amb 50 ft then sitting break x 1 min then 50 ft and sitting break with SPC (PT SBA with gait belt) Amb 50 ft then sitting break x 1 min then 50 ft and sitting break with rollator (PT SBA with gait belt)  Vitals during session: HR 95, 02 sat 98%   PATIENT EDUCATION: Education details: PT POC/goals Person educated: Patient and Child(ren) Education method: Explanation Education comprehension: verbalized understanding  HOME EXERCISE PROGRAM: Access Code: [email or web address] URL: [email or web address] Date: 03/03/2023 Prepared by: Max Fickle  Exercises - Seated March  - 1 x daily - 7 x weekly - 2 sets - 10 reps - Seated Long Arc Quad  - 1 x daily - 7 x weekly - 2 sets - 10 reps - Sit to Stand with Counter Support  - 1 x daily - 7 x weekly - 2 sets - 5 reps  GOALS: Goals reviewed with patient? Yes  SHORT TERM GOALS: Target date: 02/17/23  Pt will demonstrate ability to amb with SPC at correct height for her body mechanics Baseline: 02/10/23 too tall Goal status:  INITIAL    LONG TERM GOALS: Target date: 03/24/23  Improve TUG score to <14 sec indicating reduced risk for falls during ambulation Baseline: 24 seconds with SPC and PT SBA Goal status: INITIAL  2.  Improve 5x STS to <30 seconds indicating reduced risk for falls Baseline: 65 sec with b/l UE support Goal status: INITIAL  3.  Pt will be able to complete 6 min walk test to facilitate improved ability to amb from car in/out of a store with her daughter Baseline: will perform at future session Goal status: INITIAL    CLINICAL IMPRESSION: Able to progress standing LE strengthening exercises today with b/l UE support in parallel bars.  Pt does continue to have difficulty following multi-step instructions during therapeutic exercises.  Needs frequent cueing throughout session for maintaining pace during exercises.  She does require a few brief sitting rest breaks throughout the session.  Needs continued practice for safe sit to stand transfers not pulling on rollator with b/l UE and for getting positioned in front of the chair fully before sitting down.  She is an appropriate candidate for a course out outpatient PT to address strength, balance, gait impairments today which are likely contributing to her difficulty walking and also her risk for falls.   OBJECTIVE IMPAIRMENTS: Abnormal gait, decreased activity tolerance, decreased balance, decreased cognition, difficulty walking, decreased ROM, decreased strength, and decreased safety awareness.   ACTIVITY LIMITATIONS: standing, stairs, transfers, dressing, and locomotion level  PARTICIPATION LIMITATIONS: cleaning, laundry, shopping, and community activity  PERSONAL FACTORS: 3+ comorbidities: h/o CVA, anxiety, depression, cardiac hx, extensive medical history  are also affecting patient's functional outcome.   REHAB POTENTIAL: Good  CLINICAL  DECISION MAKING: Stable/uncomplicated  EVALUATION COMPLEXITY: Low  PLAN:  PT FREQUENCY:  2x/week  PT DURATION: 6 weeks  PLANNED INTERVENTIONS: Therapeutic exercises, Therapeutic activity, Neuromuscular re-education, Balance training, Gait training, Patient/Family education, Self Care, and Joint mobilization  PLAN FOR NEXT SESSION: LE strengthening, gait training, balance exercises; work on training with rollator (she has one but prefers her [facility]), 2 min walk or 6 min walk test   Ardine Bjork, PT 03/10/2023, 3:22 PM Max Fickle, PT, DPT, OCS  [phone]

## 2023-03-14 ENCOUNTER — Other Ambulatory Visit: Payer: Self-pay | Admitting: Internal Medicine

## 2023-03-19 ENCOUNTER — Ambulatory Visit: Payer: 59

## 2023-03-24 ENCOUNTER — Ambulatory Visit: Payer: 59

## 2023-03-24 DIAGNOSIS — R262 Difficulty in walking, not elsewhere classified: Secondary | ICD-10-CM

## 2023-03-24 DIAGNOSIS — Z8673 Personal history of transient ischemic attack (TIA), and cerebral infarction without residual deficits: Secondary | ICD-10-CM

## 2023-03-24 DIAGNOSIS — R2689 Other abnormalities of gait and mobility: Secondary | ICD-10-CM

## 2023-03-24 NOTE — Therapy (Signed)
OUTPATIENT PHYSICAL THERAPY NEURO TREATMENT/PROGRESS NOTE/RE-CERTIFICATION   Patient Name: Carly Marks MRN: [id number] DOB:1956-09-13, 67 y.o., female Today's Date: 03/24/2023   PCP: Dr. Lawerance Marks REFERRING PROVIDER: Dr. Letta Marks  END OF SESSION:  PT End of Session - 03/24/23 1432     Visit Number 5    Number of Visits 10    Date for PT Re-Evaluation 03/24/23    Authorization Type 2x/week x 6 weeks, 12 visits requested; PN/recert done on 5/15- requested 2x/week x 4 weeks (8 sessions)    Progress Note Due on Visit 15    PT Start Time 1420    PT Stop Time 1500    PT Time Calculation (min) 40 min    Equipment Utilized During Treatment Gait belt    Activity Tolerance Patient tolerated treatment well    Behavior During Therapy Flat affect             Past Medical History:  Diagnosis Date   Anemia    Anxiety    Arthritis    Asthma    CAD (coronary artery disease)    per patient   Depression    Family history of colonic polyps    GERD (gastroesophageal reflux disease)    HA (headache)    HTN (hypertension)    Major depressive disorder, recurrent episode, moderate (HCC)    Obstructive sleep apnea    Past Surgical History:  Procedure Laterality Date   CARDIAC CATHETERIZATION  02/19/15, 06/2009, 08/2011   COLONOSCOPY N/A 04/22/2015   Procedure: COLONOSCOPY;  Surgeon: Wallace Cullens, MD;  Location: [facility] ENDOSCOPY;  Service: Gastroenterology;  Laterality: N/A;   COLONOSCOPY WITH PROPOFOL N/A 09/26/2021   Procedure: COLONOSCOPY WITH PROPOFOL;  Surgeon: Jaynie Collins, DO;  Location: [facility] ENDOSCOPY;  Service: Endoscopy;  Laterality: N/A;   TUBAL LIGATION     Patient Active Problem List   Diagnosis Date Noted   Chronic atrial fibrillation (HCC) 01/08/2023   Dizziness 01/08/2023   Cerebrovascular accident (CVA) due to occlusion of left posterior cerebral artery (HCC) 06/23/2019   Major neurocognitive disorder (HCC) 04/24/2019   Seizure (HCC) 08/23/2018   Stroke  (cerebrum) (HCC) 06/25/2018   CVA (cerebral vascular accident) (HCC) 06/24/2018   Schizoaffective disorder, bipolar type (HCC) 05/21/2017   Bipolar I disorder, current or most recent episode manic, with psychotic features (HCC) 11/21/2015   Anxiety 10/08/2015   Symptomatic bradycardia 10/08/2015   CAD (coronary artery disease) 04/16/2015   Chronic constipation 04/16/2015   HTN (hypertension) 04/16/2015   Arthritis, degenerative 04/16/2015   Allergic rhinitis, seasonal 04/16/2015   Avitaminosis D 04/16/2015   Depression, major, recurrent, moderate (HCC) 04/15/2015   Atypical chest pain 06/26/2013   Brash 06/26/2013   Arthritis 06/26/2013   Asthma, intermittent 01/17/2013   Airway hyperreactivity 07/20/2011   GERD (gastroesophageal reflux disease) 07/20/2011   HCD (hypertensive cardiovascular disease) 07/20/2011    ONSET DATE: chronic  REFERRING DIAG: Z86.73 (ICD-10-CM) - History of stroke   THERAPY DIAG:  Difficulty in walking, not elsewhere classified  History of stroke  Balance problem  Rationale for Evaluation and Treatment: Rehabilitation  From initial evaluation note: SUBJECTIVE:  SUBJECTIVE STATEMENT: Pt states she is here to work on improving her walking.  PT then included pt's daughter Carly Marks) who is present to ask additional questions.  Pt's daughter states she noticed her mom's walking tolerance has declined; she is no longer able to walk from the car to inside a store to go shopping with her.  She is having some difficulty going up/down the stairs to enter her home; daughter stays close and holds on to patient for safety.    Pt accompanied by: family member daughter Carly Marks  PERTINENT HISTORY: h/o CVA in 2019.  No recent medical changes  PAIN:  Are you having pain? Pt reports  yes, in L posterior hip/glute region.  PRECAUTIONS: Fall  WEIGHT BEARING RESTRICTIONS: No  FALLS: Has patient fallen in last 6 months? Yes, getting in/out of bed, no injuries  LIVING ENVIRONMENT: Lives with: lives with their family Lives in: House/apartment Stairs: No none inside, but has stairs to enter with b/l railing Has following equipment at home: Single point cane and Walker - 4 wheeled  PLOF: Independent with household mobility with device  PATIENT GOALS: Pt improve her walking  OBJECTIVE:   COGNITION: Overall cognitive status:  Pt requires additional time to verbally express herself   SENSATION: Not tested  POSTURE: rounded shoulders  LOWER EXTREMITY ROM:     Functionally pt stands with b/l knees in slight flexion  LOWER EXTREMITY MMT:    MMT Right Eval Left Eval  Hip flexion 3+ 3+  Hip extension See 5x STS See 5x STS  Hip abduction    Hip adduction    Hip internal rotation    Hip external rotation    Knee flexion    Knee extension 4 4  Ankle dorsiflexion 4 4  Ankle plantarflexion    Ankle inversion    Ankle eversion    (Blank rows = not tested)  BED MOBILITY:  deferred TRANSFERS: Assistive device utilized: Single point cane  Sit to stand: Complete Independence requires use of b/l UE support on arm rests Stand to sit: Complete Independence Chair to chair: Complete Independence  GAIT: Gait pattern: decreased arm swing- Left, decreased stride length, and trunk flexed Distance walked: 40 ft with PT in/out of clinic Level of assistance: SBA Comments: PT with gait belt, SBA, pt amb with decreased gait speed, SPC (hurrycane type) and height of SPC is too tall  FUNCTIONAL TESTS:  OUTCOME MEASURES: TEST Outcome Interpretation  5 times sit<>stand 65 seconds >60 yo, >15 sec indicates increased risk for falls  TUG        24 seconds <14 sec indicates increased risk for falls  Berg Balance Assessment Deferred today <36/56 (100% risk for falls), 37-45  (80% risk for falls); 46-51 (>50% risk for falls); 52-55 (lower risk <25% of falls)  FOTO 45 Predicted discharge score of 56      PATIENT SURVEYS:  FOTO 45 today; predicted 56 at DC  TODAY'S TREATMENT:  DATE: 03/24/2423  Subjective: Pt reports she did not have any falls this week.  She reports she is not working on her HEP regularly since last session.  She has been doing a little walking.     OUTCOME MEASURES: TEST Outcome Interpretation 03/24/23  5 times sit<>stand 65 seconds >60 yo, >15 sec indicates increased risk for falls 31 seconds  TUG        24 seconds <14 sec indicates increased risk for falls   Berg Balance Assessment Deferred today <36/56 (100% risk for falls), 37-45 (80% risk for falls); 46-51 (>50% risk for falls); 52-55 (lower risk <25% of falls)   FOTO 45 Predicted discharge score of 56       Treatment: Therapeutic Exercises: In parallel bars (gait belt, PT SBA)- not today Standing with b/l UE support: heel raises 2x10, alt LE marches x10 (heavy UE reliance) Standing marches 2x30 seconds Standing alternating side steps 2x 30 seconds  Sit to stand 5 reps x 2 sets (pt uses b/l UE support) Nustep x 8 min, level 3 seat #12, goal SPM>45 (pt requires frequent verbal cues for pacing  Vitals during session: HR 93 bpm, SPO2 98%  Seated exercises:  Seated knee extension 2x15 3# Seated marches 2x20 3#   Amb 250 ft then sitting break with rollator  (PT SBA with gait belt)  Pt education for proper technique for sit to stand using arms on chair instead of pulling up from rollator; demonstrated; had pt practice; also had pt practice locking/unlocking brakes on rollator  Amb x 100 ft with rollator (PT SBA with gait belt)    PATIENT EDUCATION: Education details: PT POC/goals Person educated: Patient and Child(ren) Education method:  Explanation Education comprehension: verbalized understanding  HOME EXERCISE PROGRAM: Access Code: [email or web address] URL: [email or web address] Date: 03/03/2023 Prepared by: Max Fickle  Exercises - Seated March  - 1 x daily - 7 x weekly - 2 sets - 10 reps - Seated Long Arc Quad  - 1 x daily - 7 x weekly - 2 sets - 10 reps - Sit to Stand with Counter Support  - 1 x daily - 7 x weekly - 2 sets - 5 reps  GOALS: Goals reviewed with patient? Yes  SHORT TERM GOALS: Target date: 02/17/23  Pt will demonstrate ability to amb with SPC at correct height for her body mechanics Baseline: 02/10/23 too tall; 5/15: practicing with rollator to improve her walking tolerance currently Goal status: IN PROGRESS    LONG TERM GOALS: Target date: 04/21/23  Improve TUG score to <14 sec indicating reduced risk for falls during ambulation Baseline: 24 seconds with SPC and PT SBA Goal status: IN PROGRESS  2.  Improve 5x STS to <30 seconds indicating reduced risk for falls Baseline: 65 sec with b/l UE support; 5/15: 31 seconds Goal status: IN PROGRESS  3.  Pt will be able to complete 6 min walk test to facilitate improved ability to amb from car in/out of a store with her daughter Baseline: will perform at future session Goal status: INITIAL   CLINICAL IMPRESSION: Pt's 5x STS is improving.  She demonstrates unsafe transfer from stand to sitting with rollator at end of session by bringing rollator directly facing chair and the side stepping and leaning heavily outside BOS to sit into chair; further practice and training needed at next session.  Discussed with pt's family the importance of improved adherence with HEP to obtain maximum benefit from PT.  She is an appropriate candidate for a course  out outpatient PT to address strength, balance, gait impairments today which are likely contributing to her difficulty walking and also her risk for falls.   OBJECTIVE IMPAIRMENTS: Abnormal gait, decreased  activity tolerance, decreased balance, decreased cognition, difficulty walking, decreased ROM, decreased strength, and decreased safety awareness.   ACTIVITY LIMITATIONS: standing, stairs, transfers, dressing, and locomotion level  PARTICIPATION LIMITATIONS: cleaning, laundry, shopping, and community activity  PERSONAL FACTORS: 3+ comorbidities: h/o CVA, anxiety, depression, cardiac hx, extensive medical history  are also affecting patient's functional outcome.   REHAB POTENTIAL: Good  CLINICAL DECISION MAKING: Stable/uncomplicated  EVALUATION COMPLEXITY: Low  PLAN:  PT FREQUENCY: 2x/week  PT DURATION: 6 weeks  PLANNED INTERVENTIONS: Therapeutic exercises, Therapeutic activity, Neuromuscular re-education, Balance training, Gait training, Patient/Family education, Self Care, and Joint mobilization  PLAN FOR NEXT SESSION: LE strengthening, gait training, balance exercises; work on training with rollator, 2 min walk or 6 min walk test   Ardine Bjork, PT 03/24/2023, 6:23 PM Max Fickle, PT, DPT, OCS  [phone]

## 2023-03-31 ENCOUNTER — Ambulatory Visit: Payer: 59

## 2023-04-02 ENCOUNTER — Ambulatory Visit: Payer: 59 | Admitting: Cardiovascular Disease

## 2023-04-07 ENCOUNTER — Ambulatory Visit: Payer: 59

## 2023-04-09 ENCOUNTER — Other Ambulatory Visit: Payer: Self-pay | Admitting: Cardiovascular Disease

## 2023-04-09 DIAGNOSIS — I639 Cerebral infarction, unspecified: Secondary | ICD-10-CM

## 2023-04-09 DIAGNOSIS — I2510 Atherosclerotic heart disease of native coronary artery without angina pectoris: Secondary | ICD-10-CM

## 2023-04-16 ENCOUNTER — Encounter: Payer: Self-pay | Admitting: Family Medicine

## 2023-04-19 ENCOUNTER — Encounter: Payer: Self-pay | Admitting: Cardiovascular Disease

## 2023-04-19 ENCOUNTER — Telehealth: Payer: Self-pay

## 2023-04-19 ENCOUNTER — Ambulatory Visit (INDEPENDENT_AMBULATORY_CARE_PROVIDER_SITE_OTHER): Payer: 59 | Admitting: Cardiovascular Disease

## 2023-04-19 ENCOUNTER — Other Ambulatory Visit: Payer: Self-pay | Admitting: Cardiovascular Disease

## 2023-04-19 VITALS — BP 126/84 | HR 51 | Ht 66.0 in | Wt 256.0 lb

## 2023-04-19 DIAGNOSIS — I10 Essential (primary) hypertension: Secondary | ICD-10-CM | POA: Diagnosis not present

## 2023-04-19 DIAGNOSIS — R0602 Shortness of breath: Secondary | ICD-10-CM | POA: Diagnosis not present

## 2023-04-19 DIAGNOSIS — I482 Chronic atrial fibrillation, unspecified: Secondary | ICD-10-CM

## 2023-04-19 DIAGNOSIS — I2510 Atherosclerotic heart disease of native coronary artery without angina pectoris: Secondary | ICD-10-CM | POA: Diagnosis not present

## 2023-04-19 MED ORDER — AMIODARONE HCL 200 MG PO TABS: 200.0000 mg | ORAL_TABLET | Freq: Every day | ORAL | 1 refills | Status: DC

## 2023-04-19 MED ORDER — FUROSEMIDE 20 MG PO TABS: ORAL_TABLET | ORAL | 0 refills | Status: DC

## 2023-04-19 MED ORDER — FUROSEMIDE 20 MG PO TABS: 20.0000 mg | ORAL_TABLET | Freq: Two times a day (BID) | ORAL | 2 refills | Status: DC

## 2023-04-19 NOTE — Assessment & Plan Note (Signed)
Patient in sinus rhythm on auscultation. Complains of dizziness. HR low. Decrease amiodarone to 200 mg once daily.

## 2023-04-19 NOTE — Patient Instructions (Signed)
Change amiodarone to once daily.  Take furosemide twice daily for one week, then once daily for one week.

## 2023-04-19 NOTE — Assessment & Plan Note (Signed)
Denies chest pain. Short of breath one exertion only.

## 2023-04-19 NOTE — Progress Notes (Signed)
Cardiology Office Note   Date:  04/19/2023   ID:  Carly Marks, DOB 03-Sep-1956, MRN [id number]  PCP:  Hyman Hopes, MD  Cardiologist:  Adrian Blackwater, MD      History of Present Illness: Carly Marks is a 67 y.o. female who presents for  Chief Complaint  Patient presents with   Follow-up    2 month follow up    Patient in office for 2 month follow up. Denies chest pain. Complains of shortness of breath on exertion only, dizziness, bilateral lower extremity edema.     Past Medical History:  Diagnosis Date   Anemia    Anxiety    Arthritis    Asthma    CAD (coronary artery disease)    per patient   Depression    Family history of colonic polyps    GERD (gastroesophageal reflux disease)    HA (headache)    HTN (hypertension)    Major depressive disorder, recurrent episode, moderate (HCC)    Obstructive sleep apnea      Past Surgical History:  Procedure Laterality Date   CARDIAC CATHETERIZATION  02/19/15, 06/2009, 08/2011   COLONOSCOPY N/A 04/22/2015   Procedure: COLONOSCOPY;  Surgeon: Wallace Cullens, MD;  Location: [facility] ENDOSCOPY;  Service: Gastroenterology;  Laterality: N/A;   COLONOSCOPY WITH PROPOFOL N/A 09/26/2021   Procedure: COLONOSCOPY WITH PROPOFOL;  Surgeon: Jaynie Collins, DO;  Location: [facility] ENDOSCOPY;  Service: Endoscopy;  Laterality: N/A;   TUBAL LIGATION       Current Outpatient Medications  Medication Sig Dispense Refill   albuterol (VENTOLIN HFA) 108 (90 Base) MCG/ACT inhaler Inhale 2 puffs into the lungs every 6 (six) hours as needed.     amiodarone (PACERONE) 200 MG tablet Take 1 tablet (200 mg total) by mouth daily. 30 tablet 1   amLODipine-benazepril (LOTREL) 5-10 MG capsule Take 1 capsule by mouth daily.     aspirin 81 MG tablet Take 81 mg by mouth daily.     atorvastatin (LIPITOR) 40 MG tablet TAKE 1 TABLET BY MOUTH DAILY 30 tablet 2   benztropine (COGENTIN) 0.5 MG tablet Take 0.5 mg by mouth daily.     cholecalciferol  (VITAMIN D) 400 UNITS TABS tablet Take 400 Units by mouth daily.      clopidogrel (PLAVIX) 75 MG tablet Take 75 mg by mouth daily.     divalproex (DEPAKOTE) 250 MG DR tablet Take 3 tablets (750 mg total) by mouth 2 (two) times daily. 100 tablet 0   ELIQUIS 5 MG TABS tablet TAKE 1 TABLET BY MOUTH TWICE A DAY 60 tablet 2   FLUoxetine (PROZAC) 20 MG capsule Take 3 capsules (60 mg total) by mouth daily. 90 capsule 3   fluticasone (FLONASE) 50 MCG/ACT nasal spray Place 2 sprays into the nose daily.     LORazepam (ATIVAN) 1 MG tablet Take 1 tablet (1 mg total) by mouth 2 (two) times daily. 60 tablet 2   Melatonin 3 MG TABS Take 1 tablet (3 mg total) by mouth at bedtime. 30 tablet 1   metoprolol succinate (TOPROL-XL) 50 MG 24 hr tablet Take 1 tablet (50 mg total) by mouth daily. 30 tablet 2   OLANZapine (ZYPREXA) 5 MG tablet Take by mouth.     pantoprazole (PROTONIX) 20 MG tablet Take 20 mg by mouth daily.     solifenacin (VESICARE) 5 MG tablet Take 1 tablet (5 mg total) by mouth daily. 90 tablet 3   SYMBICORT 80-4.5 MCG/ACT inhaler  Inhale into the lungs.     traZODone (DESYREL) 50 MG tablet      furosemide (LASIX) 20 MG tablet Take 1 tablet (20 mg total) by mouth 2 (two) times daily. 30 tablet 2   No current facility-administered medications for this visit.    Allergies:   Codeine, Dextroamphetamine, Nsaids, Morphine and codeine, Penicillins, and Tolmetin    Social History:   reports that she has never smoked. She has never used smokeless tobacco. She reports that she does not drink alcohol and does not use drugs.   Family History:  family history includes Breast cancer (age of onset: 56) in her sister; Colon cancer in her maternal grandmother; Colon polyps in an other family member; Depression in an other family member; Diabetes in her maternal aunt and maternal uncle; Hypertension in her brother, mother, and sister.    ROS:     Review of Systems  Constitutional: Negative.   HENT: Negative.     Eyes: Negative.   Respiratory: Negative.    Cardiovascular: Negative.   Gastrointestinal: Negative.   Genitourinary: Negative.   Musculoskeletal: Negative.   Skin: Negative.   Neurological: Negative.   Endo/Heme/Allergies: Negative.   Psychiatric/Behavioral: Negative.    All other systems reviewed and are negative.    All other systems are reviewed and negative.    PHYSICAL EXAM: VS:  BP 126/84   Pulse (!) 51   Ht 5\' 6"  (1.676 m)   Wt 256 lb (116.1 kg)   SpO2 98%   BMI 41.32 kg/m  , BMI Body mass index is 41.32 kg/m. Last weight:  Wt Readings from Last 3 Encounters:  04/19/23 256 lb (116.1 kg)  02/15/23 269 lb (122 kg)  01/29/23 265 lb (120.2 kg)     Physical Exam Constitutional:      Appearance: Normal appearance.  Cardiovascular:     Rate and Rhythm: Normal rate and regular rhythm.     Heart sounds: Normal heart sounds.  Pulmonary:     Effort: Pulmonary effort is normal.     Breath sounds: Normal breath sounds.  Musculoskeletal:     Right lower leg: No edema.     Left lower leg: No edema.  Neurological:     Mental Status: She is alert.      EKG: none today  Recent Labs: No results found for requested labs within last 365 days.    Lipid Panel    Component Value Date/Time   CHOL 108 06/25/2018 0442   CHOL 171 02/04/2015 0251   TRIG 27 06/25/2018 0442   TRIG 41 02/04/2015 0251   HDL 41 06/25/2018 0442   HDL 63 02/04/2015 0251   CHOLHDL 2.6 06/25/2018 0442   VLDL 5 06/25/2018 0442   VLDL 8 02/04/2015 0251   LDLCALC 62 06/25/2018 0442   LDLCALC 100 (H) 02/04/2015 0251      ASSESSMENT AND PLAN:    ICD-10-CM   1. Chronic atrial fibrillation (HCC)  I48.20     2. Coronary artery disease involving native coronary artery of native heart without angina pectoris  I25.10     3. Primary hypertension  I10     4. SOB (shortness of breath)  R06.02        Problem List Items Addressed This Visit       Cardiovascular and Mediastinum   CAD  (coronary artery disease)    Denies chest pain. Short of breath one exertion only.       Relevant Medications   amiodarone (  PACERONE) 200 MG tablet   furosemide (LASIX) 20 MG tablet   HTN (hypertension)    Well controlled. Continue current medications.       Relevant Medications   amiodarone (PACERONE) 200 MG tablet   furosemide (LASIX) 20 MG tablet   Chronic atrial fibrillation (HCC) - Primary    Patient in sinus rhythm on auscultation. Complains of dizziness. HR low. Decrease amiodarone to 200 mg once daily.       Relevant Medications   amiodarone (PACERONE) 200 MG tablet   furosemide (LASIX) 20 MG tablet     Other   SOB (shortness of breath)    Complaining of shortness of breath on exertion, bilateral lower extremity edema. Daughter states patient has not been taking furosemide as prescribed. Restart furosemide 20 mg twice daily for 1 week, then once daily for 1 week.          Disposition:   Return in about 2 weeks (around 05/03/2023).    Total time spent: 30 minutes  Signed,  Adrian Blackwater, MD  04/19/2023 10:41 AM    [facility]

## 2023-04-19 NOTE — Assessment & Plan Note (Signed)
Well controlled. Continue current medications  

## 2023-04-19 NOTE — Assessment & Plan Note (Signed)
Complaining of shortness of breath on exertion, bilateral lower extremity edema. Daughter states patient has not been taking furosemide as prescribed. Restart furosemide 20 mg twice daily for 1 week, then once daily for 1 week.

## 2023-04-19 NOTE — Telephone Encounter (Signed)
Pharmacy called to get clarification on the lasix rx that was called in they are asking for it to be sent for #60 if she's taking it BID. They state it was sent in for BID # 30 and she's on a pill pack

## 2023-04-21 ENCOUNTER — Other Ambulatory Visit: Payer: Self-pay | Admitting: Family Medicine

## 2023-04-21 ENCOUNTER — Ambulatory Visit: Payer: 59 | Attending: Family Medicine

## 2023-04-21 DIAGNOSIS — R2689 Other abnormalities of gait and mobility: Secondary | ICD-10-CM | POA: Insufficient documentation

## 2023-04-21 DIAGNOSIS — Z8673 Personal history of transient ischemic attack (TIA), and cerebral infarction without residual deficits: Secondary | ICD-10-CM | POA: Insufficient documentation

## 2023-04-21 DIAGNOSIS — R262 Difficulty in walking, not elsewhere classified: Secondary | ICD-10-CM | POA: Diagnosis present

## 2023-04-21 DIAGNOSIS — Z1231 Encounter for screening mammogram for malignant neoplasm of breast: Secondary | ICD-10-CM

## 2023-04-21 NOTE — Therapy (Signed)
OUTPATIENT PHYSICAL THERAPY NEURO TREATMENT  Patient Name: Carly Marks MRN: [id number] DOB:[birth year], 67 y.o., female Today's Date: 04/21/2023   PCP: Dr. Lawerance Marks REFERRING PROVIDER: Dr. Letta Marks  END OF SESSION:  PT End of Session - 04/21/23 1248     Visit Number 6    Number of Visits 10    Date for PT Re-Evaluation 03/24/23    Authorization Type 2x/week x 6 weeks, 12 visits requested; PN/recert done on 5/15- requested 2x/week x 4 weeks (8 sessions)    Progress Note Due on Visit 15    PT Start Time 1248    PT Stop Time 1313    PT Time Calculation (min) 25 min    Equipment Utilized During Treatment Gait belt    Activity Tolerance Patient tolerated treatment well    Behavior During Therapy Flat affect              Past Medical History:  Diagnosis Date   Anemia    Anxiety    Arthritis    Asthma    CAD (coronary artery disease)    per patient   Depression    Family history of colonic polyps    GERD (gastroesophageal reflux disease)    HA (headache)    HTN (hypertension)    Major depressive disorder, recurrent episode, moderate (HCC)    Obstructive sleep apnea    Past Surgical History:  Procedure Laterality Date   CARDIAC CATHETERIZATION  02/19/15, 06/2009, 08/2011   COLONOSCOPY N/A 04/22/2015   Procedure: COLONOSCOPY;  Surgeon: Carly Cullens, MD;  Location: [facility] ENDOSCOPY;  Service: Gastroenterology;  Laterality: N/A;   COLONOSCOPY WITH PROPOFOL N/A 09/26/2021   Procedure: COLONOSCOPY WITH PROPOFOL;  Surgeon: Carly Collins, DO;  Location: [facility] ENDOSCOPY;  Service: Endoscopy;  Laterality: N/A;   TUBAL LIGATION     Patient Active Problem List   Diagnosis Date Noted   SOB (shortness of breath) 04/19/2023   Chronic atrial fibrillation (HCC) 01/08/2023   Dizziness 01/08/2023   Cerebrovascular accident (CVA) due to occlusion of left posterior cerebral artery (HCC) 06/23/2019   Major neurocognitive disorder (HCC) 04/24/2019   Seizure (HCC) 08/23/2018   Stroke  (cerebrum) (HCC) 06/25/2018   CVA (cerebral vascular accident) (HCC) 06/24/2018   Schizoaffective disorder, bipolar type (HCC) 05/21/2017   Bipolar I disorder, current or most recent episode manic, with psychotic features (HCC) 11/21/2015   Anxiety 10/08/2015   Symptomatic bradycardia 10/08/2015   CAD (coronary artery disease) 04/16/2015   Chronic constipation 04/16/2015   HTN (hypertension) 04/16/2015   Arthritis, degenerative 04/16/2015   Allergic rhinitis, seasonal 04/16/2015   Avitaminosis D 04/16/2015   Depression, major, recurrent, moderate (HCC) 04/15/2015   Atypical chest pain 06/26/2013   Brash 06/26/2013   Arthritis 06/26/2013   Asthma, intermittent 01/17/2013   Airway hyperreactivity 07/20/2011   GERD (gastroesophageal reflux disease) 07/20/2011   HCD (hypertensive cardiovascular disease) 07/20/2011    ONSET DATE: chronic  REFERRING DIAG: Z86.73 (ICD-10-CM) - History of stroke   THERAPY DIAG:  Difficulty in walking, not elsewhere classified  Balance problem  History of stroke  Rationale for Evaluation and Treatment: Rehabilitation  From initial evaluation note: SUBJECTIVE:  SUBJECTIVE STATEMENT: Pt states she is here to work on improving her walking.  PT then included pt's daughter Carly Marks) who is present to ask additional questions.  Pt's daughter states she noticed her mom's walking tolerance has declined; she is no longer able to walk from the car to inside a store to go shopping with her.  She is having some difficulty going up/down the stairs to enter her home; daughter stays close and holds on to patient for safety.    Pt accompanied by: family member daughter Carly Marks  PERTINENT HISTORY: h/o CVA in 2019.  No recent medical changes  PAIN:  Are you having pain? Pt reports  yes, in L posterior hip/glute region.  PRECAUTIONS: Fall  WEIGHT BEARING RESTRICTIONS: No  FALLS: Has patient fallen in last 6 months? Yes, getting in/out of bed, no injuries  LIVING ENVIRONMENT: Lives with: lives with their family Lives in: House/apartment Stairs: No none inside, but has stairs to enter with b/l railing Has following equipment at home: Single point cane and Carly Marks - 4 wheeled  PLOF: Independent with household mobility with device  PATIENT GOALS: Pt improve her walking  OBJECTIVE:   COGNITION: Overall cognitive status:  Pt requires additional time to verbally express herself   SENSATION: Not tested  POSTURE: rounded shoulders  LOWER EXTREMITY ROM:     Functionally pt stands with b/l knees in slight flexion  LOWER EXTREMITY MMT:    MMT Right Eval Left Eval  Hip flexion 3+ 3+  Hip extension See 5x STS See 5x STS  Hip abduction    Hip adduction    Hip internal rotation    Hip external rotation    Knee flexion    Knee extension 4 4  Ankle dorsiflexion 4 4  Ankle plantarflexion    Ankle inversion    Ankle eversion    (Blank rows = not tested)  BED MOBILITY:  deferred TRANSFERS: Assistive device utilized: Single point cane  Sit to stand: Complete Independence requires use of b/l UE support on arm rests Stand to sit: Complete Independence Chair to chair: Complete Independence  GAIT: Gait pattern: decreased arm swing- Left, decreased stride length, and trunk flexed Distance walked: 40 ft with PT in/out of clinic Level of assistance: SBA Comments: PT with gait belt, SBA, pt amb with decreased gait speed, SPC (hurrycane type) and height of SPC is too tall  FUNCTIONAL TESTS:  OUTCOME MEASURES: TEST Outcome Interpretation  5 times sit<>stand 65 seconds >60 yo, >15 sec indicates increased risk for falls  TUG        24 seconds <14 sec indicates increased risk for falls  Berg Balance Assessment Deferred today <36/56 (100% risk for falls), 37-45  (80% risk for falls); 46-51 (>50% risk for falls); 52-55 (lower risk <25% of falls)  FOTO 45 Predicted discharge score of 56      PATIENT SURVEYS:  FOTO 45 today; predicted 56 at DC  TODAY'S TREATMENT:  DATE: 03/24/2423  Subjective: Patient reports dizziness on arrival that is worse when standing but lessens when sitting. Patient reports fall on 6/7 where her legs gave out on her and had dizziness prior to falling. Increased response time in standing.   Automatic:  BP sitting: 137/61  BP standing (patient had to sit prior to reading): 127/102 BP sitting: 122/99 BP standing: 158/146  Manual:   Standing: 128/108    2nd reading 128/120   Educated son and patient on diastolic >110 in standing and importance of transporting patient by POV to urgent care to be assessed and if unable to be seen within 1-2 hours, need to proceed to ED, son and patient verbalized understanding. Instructed on terminating therapy session due to elevated BP being contraindicated for exercise at this time.   Patient performed standing x 4 for BP checks. After BP assessments and education to son and patient, assisted patient into wheelchair and into car with min guard and use of SPC for safety.       Treatment: (Not today)  Therapeutic Exercises: Nustep x 8 min, level 3 seat #12, goal SPM>45 (pt requires frequent verbal cues for pacing  In parallel bars (gait belt, PT SBA) Standing with b/l UE support: heel raises 2x10, alt LE marches x10 (heavy UE reliance) Standing marches 2x30 seconds Standing alternating side steps 2x 30 seconds  Sit to stand 5 reps x 2 sets (pt uses b/l UE support)   Seated exercises:  Seated knee extension 2x15 3# Seated marches 2x20 3#   Amb 250 ft then sitting break with rollator  (PT SBA with gait belt)  Pt education for proper technique for sit to stand  using arms on chair instead of pulling up from rollator; demonstrated; had pt practice; also had pt practice locking/unlocking brakes on rollator  Amb x 100 ft with rollator (PT SBA with gait belt)    PATIENT EDUCATION: Education details: PT POC/goals Person educated: Patient and Child(ren) Education method: Explanation Education comprehension: verbalized understanding  HOME EXERCISE PROGRAM: Access Code: [email or web address] URL: [email or web address] Date: 03/03/2023 Prepared by: Max Fickle  Exercises - Seated March  - 1 x daily - 7 x weekly - 2 sets - 10 reps - Seated Long Arc Quad  - 1 x daily - 7 x weekly - 2 sets - 10 reps - Sit to Stand with Counter Support  - 1 x daily - 7 x weekly - 2 sets - 5 reps  GOALS: Goals reviewed with patient? Yes  SHORT TERM GOALS: Target date: 02/17/23  Pt will demonstrate ability to amb with SPC at correct height for her body mechanics Baseline: 02/10/23 too tall; 5/15: practicing with rollator to improve her walking tolerance currently Goal status: IN PROGRESS    LONG TERM GOALS: Target date: 04/21/23  Improve TUG score to <14 sec indicating reduced risk for falls during ambulation Baseline: 24 seconds with SPC and PT SBA Goal status: IN PROGRESS  2.  Improve 5x STS to <30 seconds indicating reduced risk for falls Baseline: 65 sec with b/l UE support; 5/15: 31 seconds Goal status: IN PROGRESS  3.  Pt will be able to complete 6 min walk test to facilitate improved ability to amb from car in/out of a store with her daughter Baseline: will perform at future session Goal status: INITIAL   CLINICAL IMPRESSION:  Patient arrives to therapy session complaining of dizziness that worsens with standing and reports 1 fall on 6/7 after knees giving out and dizziness prior to  fall. Assessed BP in sitting and standing with diastolic BP reading >110 in standing with manual cuff. Educated patient and son on need for transport to urgent care  via POV for evaluation of elevated BP and if unable to be seen within 1-2 hours to proceed to ED for evaluation, both verbalized understanding. Assisted patient into wheelchair and car transfer with [facility] and min guard for safety. Terminated therapy session 2/2 elevated diastolic BP which is contraindicated for exercise.    OBJECTIVE IMPAIRMENTS: Abnormal gait, decreased activity tolerance, decreased balance, decreased cognition, difficulty walking, decreased ROM, decreased strength, and decreased safety awareness.   ACTIVITY LIMITATIONS: standing, stairs, transfers, dressing, and locomotion level  PARTICIPATION LIMITATIONS: cleaning, laundry, shopping, and community activity  PERSONAL FACTORS: 3+ comorbidities: h/o CVA, anxiety, depression, cardiac hx, extensive medical history  are also affecting patient's functional outcome.   REHAB POTENTIAL: Good  CLINICAL DECISION MAKING: Stable/uncomplicated  EVALUATION COMPLEXITY: Low  PLAN:  PT FREQUENCY: 2x/week  PT DURATION: 6 weeks  PLANNED INTERVENTIONS: Therapeutic exercises, Therapeutic activity, Neuromuscular re-education, Balance training, Gait training, Patient/Family education, Self Care, and Joint mobilization  PLAN FOR NEXT SESSION: LE strengthening, gait training, balance exercises; work on training with rollator, 2 min walk or 6 min walk test   Maylon Peppers, PT, DPT Physical Therapist - [facility]  [facility]  04/21/2023, 1:18 PM

## 2023-04-26 ENCOUNTER — Ambulatory Visit: Payer: 59

## 2023-04-28 ENCOUNTER — Ambulatory Visit: Payer: 59

## 2023-04-30 ENCOUNTER — Other Ambulatory Visit: Payer: Self-pay | Admitting: Primary Care

## 2023-04-30 DIAGNOSIS — R251 Tremor, unspecified: Secondary | ICD-10-CM

## 2023-04-30 DIAGNOSIS — R42 Dizziness and giddiness: Secondary | ICD-10-CM

## 2023-04-30 DIAGNOSIS — R410 Disorientation, unspecified: Secondary | ICD-10-CM

## 2023-05-04 ENCOUNTER — Ambulatory Visit (INDEPENDENT_AMBULATORY_CARE_PROVIDER_SITE_OTHER): Payer: 59 | Admitting: Cardiovascular Disease

## 2023-05-04 ENCOUNTER — Encounter: Payer: Self-pay | Admitting: Cardiovascular Disease

## 2023-05-04 ENCOUNTER — Other Ambulatory Visit: Payer: Self-pay

## 2023-05-04 VITALS — BP 130/80 | HR 78 | Ht 66.0 in | Wt 258.8 lb

## 2023-05-04 DIAGNOSIS — I2510 Atherosclerotic heart disease of native coronary artery without angina pectoris: Secondary | ICD-10-CM | POA: Diagnosis not present

## 2023-05-04 DIAGNOSIS — I63429 Cerebral infarction due to embolism of unspecified anterior cerebral artery: Secondary | ICD-10-CM

## 2023-05-04 DIAGNOSIS — R0602 Shortness of breath: Secondary | ICD-10-CM

## 2023-05-04 DIAGNOSIS — I482 Chronic atrial fibrillation, unspecified: Secondary | ICD-10-CM

## 2023-05-04 DIAGNOSIS — I10 Essential (primary) hypertension: Secondary | ICD-10-CM

## 2023-05-04 DIAGNOSIS — R42 Dizziness and giddiness: Secondary | ICD-10-CM

## 2023-05-04 MED ORDER — AMIODARONE HCL 200 MG PO TABS
200.0000 mg | ORAL_TABLET | Freq: Two times a day (BID) | ORAL | 1 refills | Status: DC
Start: 2023-05-04 — End: 2023-09-21

## 2023-05-04 MED ORDER — AMIODARONE HCL 200 MG PO TABS: 200.0000 mg | ORAL_TABLET | Freq: Two times a day (BID) | ORAL | 1 refills | Status: DC

## 2023-05-04 NOTE — Progress Notes (Signed)
Cardiology Office Note   Date:  05/04/2023   ID:  Carly, Marks 11/14/1955, MRN [id number]  PCP:  Carly Hopes, MD  Cardiologist:  Carly Blackwater, MD      History of Present Illness: Carly Marks is a 67 y.o. female who presents for  Chief Complaint  Patient presents with   Follow-up    2 week F/U    Feeling dizzy  Dizziness This is a new problem. The current episode started 1 to 4 weeks ago. The problem has been gradually worsening.      Past Medical History:  Diagnosis Date   Anemia    Anxiety    Arthritis    Asthma    CAD (coronary artery disease)    per patient   Depression    Family history of colonic polyps    GERD (gastroesophageal reflux disease)    HA (headache)    HTN (hypertension)    Major depressive disorder, recurrent episode, moderate (HCC)    Obstructive sleep apnea      Past Surgical History:  Procedure Laterality Date   CARDIAC CATHETERIZATION  02/19/15, 06/2009, 08/2011   COLONOSCOPY N/A 04/22/2015   Procedure: COLONOSCOPY;  Surgeon: Carly Cullens, MD;  Location: [facility] ENDOSCOPY;  Service: Gastroenterology;  Laterality: N/A;   COLONOSCOPY WITH PROPOFOL N/A 09/26/2021   Procedure: COLONOSCOPY WITH PROPOFOL;  Surgeon: Carly Collins, DO;  Location: [facility] ENDOSCOPY;  Service: Endoscopy;  Laterality: N/A;   TUBAL LIGATION       Current Outpatient Medications  Medication Sig Dispense Refill   albuterol (VENTOLIN HFA) 108 (90 Base) MCG/ACT inhaler Inhale 2 puffs into the lungs every 6 (six) hours as needed.     amiodarone (PACERONE) 200 MG tablet Take 1 tablet (200 mg total) by mouth 2 (two) times daily. 30 tablet 1   aspirin 81 MG tablet Take 81 mg by mouth daily.     atorvastatin (LIPITOR) 40 MG tablet TAKE 1 TABLET BY MOUTH DAILY 30 tablet 2   benztropine (COGENTIN) 0.5 MG tablet Take 0.5 mg by mouth daily.     cholecalciferol (VITAMIN D) 400 UNITS TABS tablet Take 400 Units by mouth daily.      clopidogrel (PLAVIX) 75 MG  tablet Take 75 mg by mouth daily.     divalproex (DEPAKOTE) 250 MG DR tablet Take 3 tablets (750 mg total) by mouth 2 (two) times daily. 100 tablet 0   ELIQUIS 5 MG TABS tablet TAKE 1 TABLET BY MOUTH TWICE A DAY 60 tablet 2   FLUoxetine (PROZAC) 20 MG capsule Take 3 capsules (60 mg total) by mouth daily. 90 capsule 3   fluticasone (FLONASE) 50 MCG/ACT nasal spray Place 2 sprays into the nose daily.     furosemide (LASIX) 20 MG tablet Take 1 tablet by mouth twice daily for 1 week, then once daily for 1 week. 21 tablet 0   LORazepam (ATIVAN) 1 MG tablet Take 1 tablet (1 mg total) by mouth 2 (two) times daily. 60 tablet 2   Melatonin 3 MG TABS Take 1 tablet (3 mg total) by mouth at bedtime. 30 tablet 1   metoprolol succinate (TOPROL-XL) 50 MG 24 hr tablet Take 1 tablet (50 mg total) by mouth daily. 30 tablet 2   OLANZapine (ZYPREXA) 5 MG tablet Take by mouth.     pantoprazole (PROTONIX) 20 MG tablet Take 20 mg by mouth daily.     solifenacin (VESICARE) 5 MG tablet Take 1 tablet (5  mg total) by mouth daily. 90 tablet 3   SYMBICORT 80-4.5 MCG/ACT inhaler Inhale into the lungs.     traZODone (DESYREL) 50 MG tablet      No current facility-administered medications for this visit.    Allergies:   Codeine, Dextroamphetamine, Nsaids, Morphine and codeine, Penicillins, and Tolmetin    Social History:   reports that she has never smoked. She has never used smokeless tobacco. She reports that she does not drink alcohol and does not use drugs.   Family History:  family history includes Breast cancer (age of onset: 54) in her sister; Colon cancer in her maternal grandmother; Colon polyps in an other family member; Depression in an other family member; Diabetes in her maternal aunt and maternal uncle; Hypertension in her brother, mother, and sister.    ROS:     Review of Systems  Constitutional: Negative.   HENT: Negative.    Eyes: Negative.   Respiratory: Negative.    Gastrointestinal: Negative.    Genitourinary: Negative.   Musculoskeletal: Negative.   Skin: Negative.   Neurological:  Positive for dizziness.  Endo/Heme/Allergies: Negative.   Psychiatric/Behavioral: Negative.    All other systems reviewed and are negative.     All other systems are reviewed and negative.    PHYSICAL EXAM: VS:  BP 130/80   Pulse 78   Ht 5\' 6"  (1.676 m)   Wt 258 lb 12.8 oz (117.4 kg)   SpO2 96%   BMI 41.77 kg/m  , BMI Body mass index is 41.77 kg/m. Last weight:  Wt Readings from Last 3 Encounters:  05/04/23 258 lb 12.8 oz (117.4 kg)  04/19/23 256 lb (116.1 kg)  02/15/23 269 lb (122 kg)     Physical Exam Constitutional:      Appearance: Normal appearance.  Cardiovascular:     Rate and Rhythm: Normal rate and regular rhythm.     Heart sounds: Normal heart sounds.  Pulmonary:     Effort: Pulmonary effort is normal.     Breath sounds: Normal breath sounds.  Musculoskeletal:     Right lower leg: No edema.     Left lower leg: No edema.  Neurological:     Mental Status: She is alert.       EKG:   Recent Labs: No results found for requested labs within last 365 days.    Lipid Panel    Component Value Date/Time   CHOL 108 06/25/2018 0442   CHOL 171 02/04/2015 0251   TRIG 27 06/25/2018 0442   TRIG 41 02/04/2015 0251   HDL 41 06/25/2018 0442   HDL 63 02/04/2015 0251   CHOLHDL 2.6 06/25/2018 0442   VLDL 5 06/25/2018 0442   VLDL 8 02/04/2015 0251   LDLCALC 62 06/25/2018 0442   LDLCALC 100 (H) 02/04/2015 0251      Other studies Reviewed: Additional studies/ records that were reviewed today include:  Review of the above records demonstrates:       No data to display            ASSESSMENT AND PLAN:    ICD-10-CM   1. Coronary artery disease involving native coronary artery of native heart without angina pectoris  I25.10 amiodarone (PACERONE) 200 MG tablet    2. Chronic atrial fibrillation (HCC)  I48.20 amiodarone (PACERONE) 200 MG tablet   Try amiodrone  200 bid, as till in afib    3. Primary hypertension  I10 amiodarone (PACERONE) 200 MG tablet    4. Cerebrovascular accident (  CVA) due to embolism of anterior cerebral artery, unspecified blood vessel laterality (HCC)  I63.429 amiodarone (PACERONE) 200 MG tablet    5. Dizziness  R42 amiodarone (PACERONE) 200 MG tablet   getting worse, at time BP is low    6. SOB (shortness of breath)  R06.02 amiodarone (PACERONE) 200 MG tablet       Problem List Items Addressed This Visit       Cardiovascular and Mediastinum   CAD (coronary artery disease) - Primary   Relevant Medications   amiodarone (PACERONE) 200 MG tablet   HTN (hypertension)   Relevant Medications   amiodarone (PACERONE) 200 MG tablet   Stroke (cerebrum) (HCC)   Relevant Medications   amiodarone (PACERONE) 200 MG tablet   Chronic atrial fibrillation (HCC)   Relevant Medications   amiodarone (PACERONE) 200 MG tablet     Other   Dizziness   Relevant Medications   amiodarone (PACERONE) 200 MG tablet   SOB (shortness of breath)   Relevant Medications   amiodarone (PACERONE) 200 MG tablet       Disposition:   Return in about 4 weeks (around 06/01/2023).    Total time spent: 30 minutes  Signed,  Carly Blackwater, MD  05/04/2023 9:25 AM    [facility]

## 2023-05-07 ENCOUNTER — Other Ambulatory Visit: Payer: Self-pay | Admitting: Cardiovascular Disease

## 2023-05-07 ENCOUNTER — Ambulatory Visit
Admission: RE | Admit: 2023-05-07 | Discharge: 2023-05-07 | Disposition: A | Discharge status: Home / Self Care | Payer: 59 | Source: Ambulatory Visit | Attending: Primary Care | Admitting: Primary Care

## 2023-05-07 DIAGNOSIS — R251 Tremor, unspecified: Secondary | ICD-10-CM | POA: Insufficient documentation

## 2023-05-07 DIAGNOSIS — R42 Dizziness and giddiness: Secondary | ICD-10-CM | POA: Diagnosis present

## 2023-05-07 DIAGNOSIS — R410 Disorientation, unspecified: Secondary | ICD-10-CM | POA: Diagnosis present

## 2023-05-07 DIAGNOSIS — I10 Essential (primary) hypertension: Secondary | ICD-10-CM

## 2023-05-07 DIAGNOSIS — I2510 Atherosclerotic heart disease of native coronary artery without angina pectoris: Secondary | ICD-10-CM

## 2023-05-21 ENCOUNTER — Other Ambulatory Visit: Payer: Self-pay

## 2023-05-21 ENCOUNTER — Ambulatory Visit
Admission: RE | Admit: 2023-05-21 | Discharge: 2023-05-21 | Disposition: A | Discharge status: Home / Self Care | Payer: 59 | Source: Ambulatory Visit | Attending: Family Medicine | Admitting: Family Medicine

## 2023-05-21 DIAGNOSIS — Z1231 Encounter for screening mammogram for malignant neoplasm of breast: Secondary | ICD-10-CM | POA: Insufficient documentation

## 2023-05-21 MED ORDER — SOLIFENACIN SUCCINATE 5 MG PO TABS: 5.0000 mg | ORAL_TABLET | Freq: Every day | ORAL | 0 refills | Status: DC

## 2023-05-21 NOTE — Telephone Encounter (Signed)
[name], patient's daughter, called asking for refill on Vesicare as she will not have enough to last until follow up appointment. Refill provided and advised to keep follow up appointment.

## 2023-06-01 ENCOUNTER — Encounter: Payer: Self-pay | Admitting: Cardiology

## 2023-06-01 ENCOUNTER — Ambulatory Visit (INDEPENDENT_AMBULATORY_CARE_PROVIDER_SITE_OTHER): Payer: 59 | Admitting: Cardiology

## 2023-06-01 VITALS — BP 140/89 | HR 76 | Ht 66.0 in | Wt 265.4 lb

## 2023-06-01 DIAGNOSIS — I10 Essential (primary) hypertension: Secondary | ICD-10-CM

## 2023-06-01 DIAGNOSIS — R0602 Shortness of breath: Secondary | ICD-10-CM

## 2023-06-01 DIAGNOSIS — I2510 Atherosclerotic heart disease of native coronary artery without angina pectoris: Secondary | ICD-10-CM

## 2023-06-01 DIAGNOSIS — I482 Chronic atrial fibrillation, unspecified: Secondary | ICD-10-CM

## 2023-06-01 DIAGNOSIS — R601 Generalized edema: Secondary | ICD-10-CM

## 2023-06-01 MED ORDER — FUROSEMIDE 20 MG PO TABS: 20.0000 mg | ORAL_TABLET | ORAL | 0 refills | Status: DC

## 2023-06-01 NOTE — Progress Notes (Signed)
Cardiology Office Note   Date:  06/01/2023   ID:  Carly Marks, Carly Marks 12-23-1955, MRN [id number]  PCP:  Emogene Morgan, MD  Cardiologist:  Marisue Ivan, NP      History of Present Illness: Carly Marks is a 67 y.o. female who presents for  Chief Complaint  Patient presents with   Follow-up    4 week F/U    Patient in office for 4 week follow up. Patient complaining of dyspnea on exertion only, leg weakness and edema. Daughter states nephrologist stopped Lasix. Restart lasix 20 mg every other day. Contact primary care physician for possible physical therapy referral for weakness.     Past Medical History:  Diagnosis Date   Anemia    Anxiety    Arthritis    Asthma    CAD (coronary artery disease)    per patient   Depression    Family history of colonic polyps    GERD (gastroesophageal reflux disease)    HA (headache)    HTN (hypertension)    Major depressive disorder, recurrent episode, moderate (HCC)    Obstructive sleep apnea      Past Surgical History:  Procedure Laterality Date   CARDIAC CATHETERIZATION  02/19/15, 06/2009, 08/2011   COLONOSCOPY N/A 04/22/2015   Procedure: COLONOSCOPY;  Surgeon: Wallace Cullens, MD;  Location: [facility] ENDOSCOPY;  Service: Gastroenterology;  Laterality: N/A;   COLONOSCOPY WITH PROPOFOL N/A 09/26/2021   Procedure: COLONOSCOPY WITH PROPOFOL;  Surgeon: Jaynie Collins, DO;  Location: [facility] ENDOSCOPY;  Service: Endoscopy;  Laterality: N/A;   TUBAL LIGATION       Current Outpatient Medications  Medication Sig Dispense Refill   albuterol (VENTOLIN HFA) 108 (90 Base) MCG/ACT inhaler Inhale 2 puffs into the lungs every 6 (six) hours as needed.     amiodarone (PACERONE) 200 MG tablet Take 1 tablet (200 mg total) by mouth 2 (two) times daily. 60 tablet 1   benztropine (COGENTIN) 0.5 MG tablet Take 0.5 mg by mouth daily.     cholecalciferol (VITAMIN D) 400 UNITS TABS tablet Take 400 Units by mouth daily.      divalproex (DEPAKOTE)  250 MG DR tablet Take 3 tablets (750 mg total) by mouth 2 (two) times daily. 100 tablet 0   ELIQUIS 5 MG TABS tablet TAKE 1 TABLET BY MOUTH TWICE A DAY 60 tablet 2   fluticasone (FLONASE) 50 MCG/ACT nasal spray Place 2 sprays into the nose daily.     Melatonin 3 MG TABS Take 1 tablet (3 mg total) by mouth at bedtime. 30 tablet 1   metoprolol succinate (TOPROL-XL) 50 MG 24 hr tablet TAKE 1 TABLET BY MOUTH DAILY 30 tablet 2   OLANZapine (ZYPREXA) 5 MG tablet Take by mouth.     pantoprazole (PROTONIX) 20 MG tablet Take 20 mg by mouth daily.     solifenacin (VESICARE) 5 MG tablet Take 1 tablet (5 mg total) by mouth daily. 90 tablet 0   SYMBICORT 80-4.5 MCG/ACT inhaler Inhale into the lungs.     traZODone (DESYREL) 50 MG tablet      aspirin 81 MG tablet Take 81 mg by mouth daily. (Patient not taking: Reported on 06/01/2023)     atorvastatin (LIPITOR) 40 MG tablet TAKE 1 TABLET BY MOUTH DAILY (Patient not taking: Reported on 06/01/2023) 30 tablet 2   clopidogrel (PLAVIX) 75 MG tablet Take 75 mg by mouth daily. (Patient not taking: Reported on 06/01/2023)     FLUoxetine (PROZAC) 20  MG capsule Take 3 capsules (60 mg total) by mouth daily. (Patient not taking: Reported on 06/01/2023) 90 capsule 3   furosemide (LASIX) 20 MG tablet Take 1 tablet by mouth twice daily for 1 week, then once daily for 1 week. (Patient not taking: Reported on 06/01/2023) 21 tablet 0   LORazepam (ATIVAN) 1 MG tablet Take 1 tablet (1 mg total) by mouth 2 (two) times daily. (Patient not taking: Reported on 06/01/2023) 60 tablet 2   No current facility-administered medications for this visit.    Allergies:   Codeine, Dextroamphetamine, Nsaids, Morphine and codeine, Penicillins, and Tolmetin    Social History:   reports that she has never smoked. She has never used smokeless tobacco. She reports that she does not drink alcohol and does not use drugs.   Family History:  family history includes Breast cancer (age of onset: 75) in her  sister; Colon cancer in her maternal grandmother; Colon polyps in an other family member; Depression in an other family member; Diabetes in her maternal aunt and maternal uncle; Hypertension in her brother, mother, and sister.    ROS:     Review of Systems  Constitutional: Negative.   HENT: Negative.    Eyes: Negative.   Respiratory:  Positive for shortness of breath.   Cardiovascular:  Positive for leg swelling.  Gastrointestinal: Negative.   Genitourinary: Negative.   Musculoskeletal: Negative.   Skin: Negative.   Neurological: Negative.   Endo/Heme/Allergies: Negative.   Psychiatric/Behavioral: Negative.    All other systems reviewed and are negative.     All other systems are reviewed and negative.    PHYSICAL EXAM: VS:  BP (!) 140/89   Pulse 76   Ht 5\' 6"  (1.676 m)   Wt 265 lb 6.4 oz (120.4 kg)   SpO2 99%   BMI 42.84 kg/m  , BMI Body mass index is 42.84 kg/m. Last weight:  Wt Readings from Last 3 Encounters:  06/01/23 265 lb 6.4 oz (120.4 kg)  05/04/23 258 lb 12.8 oz (117.4 kg)  04/19/23 256 lb (116.1 kg)     Physical Exam Vitals and nursing note reviewed.  Constitutional:      Appearance: Normal appearance. She is normal weight.  HENT:     Head: Normocephalic and atraumatic.     Nose: Nose normal.     Mouth/Throat:     Mouth: Mucous membranes are moist.     Pharynx: Oropharynx is clear.  Eyes:     Conjunctiva/sclera: Conjunctivae normal.     Pupils: Pupils are equal, round, and reactive to light.  Cardiovascular:     Rate and Rhythm: Normal rate and regular rhythm.     Pulses: Normal pulses.     Heart sounds: Normal heart sounds.  Pulmonary:     Effort: Pulmonary effort is normal.     Breath sounds: Normal breath sounds.  Abdominal:     General: Abdomen is flat. Bowel sounds are normal.     Palpations: Abdomen is soft.  Musculoskeletal:        General: Normal range of motion.     Cervical back: Normal range of motion.     Right lower leg: Edema  present.     Left lower leg: Edema present.  Skin:    General: Skin is warm and dry.  Neurological:     General: No focal deficit present.     Mental Status: She is alert and oriented to person, place, and time. Mental status is at baseline.  Psychiatric:        Mood and Affect: Mood normal.        Behavior: Behavior normal.      EKG: none today  Recent Labs: No results found for requested labs within last 365 days.    Lipid Panel    Component Value Date/Time   CHOL 108 06/25/2018 0442   CHOL 171 02/04/2015 0251   TRIG 27 06/25/2018 0442   TRIG 41 02/04/2015 0251   HDL 41 06/25/2018 0442   HDL 63 02/04/2015 0251   CHOLHDL 2.6 06/25/2018 0442   VLDL 5 06/25/2018 0442   VLDL 8 02/04/2015 0251   LDLCALC 62 06/25/2018 0442   LDLCALC 100 (H) 02/04/2015 0251     ASSESSMENT AND PLAN: Restart furosemide every other day. Contact PCP for physical therapy referral for leg weakness.     ICD-10-CM   1. Primary hypertension  I10     2. Coronary artery disease involving native coronary artery of native heart without angina pectoris  I25.10     3. Chronic atrial fibrillation (HCC)  I48.20        Problem List Items Addressed This Visit       Cardiovascular and Mediastinum   CAD (coronary artery disease)   HTN (hypertension) - Primary   Chronic atrial fibrillation (HCC)     Disposition:   No follow-ups on file.    Total time spent: 25 minutes  Signed,  Google, NP  06/01/2023 1:47 PM    [facility]

## 2023-06-01 NOTE — Patient Instructions (Signed)
Take Lasix 20 mg every other day.

## 2023-06-02 ENCOUNTER — Telehealth: Payer: Self-pay

## 2023-06-04 NOTE — Telephone Encounter (Signed)
Has confirmed with pharmacy pt order.

## 2023-06-07 ENCOUNTER — Ambulatory Visit: Payer: Medicare Other | Admitting: Urology

## 2023-06-14 ENCOUNTER — Ambulatory Visit: Payer: 59 | Admitting: Urology

## 2023-06-14 VITALS — BP 141/92

## 2023-06-14 DIAGNOSIS — N3946 Mixed incontinence: Secondary | ICD-10-CM | POA: Diagnosis not present

## 2023-06-14 LAB — URINALYSIS, COMPLETE
Bilirubin, UA: NEGATIVE
Glucose, UA: NEGATIVE
Ketones, UA: NEGATIVE
Nitrite, UA: NEGATIVE
RBC, UA: NEGATIVE
Specific Gravity, UA: 1.01 (ref 1.005–1.030)
Urobilinogen, Ur: 1 mg/dL (ref 0.2–1.0)
pH, UA: 6.5 (ref 5.0–7.5)

## 2023-06-14 LAB — MICROSCOPIC EXAMINATION

## 2023-06-14 MED ORDER — SOLIFENACIN SUCCINATE 5 MG PO TABS: 5.0000 mg | ORAL_TABLET | Freq: Every day | ORAL | 0 refills | Status: DC

## 2023-06-14 NOTE — Addendum Note (Signed)
Addended byRanda Lynn on: 06/14/2023 10:46 AM   Modules accepted: Orders

## 2023-06-14 NOTE — Progress Notes (Signed)
06/14/2023 10:31 AM   Eliseo Squires [birth year] [id number]  Referring provider: Hyman Hopes, MD [address] [place],  [place] [address]  No chief complaint on file.   HPI: I was consulted to assess the patient frequency.  For the last several months or so she said frequency and some burning.  Worse in the last 4 weeks.  Her history was more difficult.  She can void every 1 hour and cannot hold it for 2 hours.  She does leak with coughing sneezing but not bending lifting.  She has urge incontinence.  She wears 1 pad a day mildly wet.  She has bedwetting with mild severity.  She gets infrequent bladder infections   Patient has mixed incontinence but in the last month is going more frequently with a little bit of burning.  Urine looked normal but sent for culture.  I thought was reasonable to try her on an overactive bladder medication The overactive bladder medication may also help her milder bedwetting and next incontinence which did not appear to be the primary complaint.  She has never smoked.  No blood in the urine.  I have not yet recommended cystoscopy.  We were teasing her about the barbecue restaurant locally Myrbetriq samples and prescription given   TOday Frequency stable.  Last urine culture negative Last 2 months much more urge incontinence.  Clinically not infected.     Urine sent for culture and call if positive.  I called in oxybutynin ER 10 mg 3x11.  We hand-delivered to Vesicare 5 mg prescription to try 1 month if oxybutynin fails.  We went over instructions with her a few times to be clear.  Reassess in 8 weeks.   Today Patient has not been seen for over 1 year.  History was very difficult.  Clinically not infected.  I think she is doing well on Vesicare.  Voids every 4 hours.  Urge incontinence I think it is improved Vesicare 90 x 3 sent to pharmacy  Today Frequency stable.  Overall doing very well but she is not a good historian.  Caregivers thought she was doing  well.  She is in a wheelchair    PMH: Past Medical History:  Diagnosis Date   Anemia    Anxiety    Arthritis    Asthma    CAD (coronary artery disease)    per patient   Depression    Family history of colonic polyps    GERD (gastroesophageal reflux disease)    HA (headache)    HTN (hypertension)    Major depressive disorder, recurrent episode, moderate (HCC)    Obstructive sleep apnea     Surgical History: Past Surgical History:  Procedure Laterality Date   CARDIAC CATHETERIZATION  02/19/15, 06/2009, 08/2011   COLONOSCOPY N/A 04/22/2015   Procedure: COLONOSCOPY;  Surgeon: Wallace Cullens, MD;  Location: [facility] ENDOSCOPY;  Service: Gastroenterology;  Laterality: N/A;   COLONOSCOPY WITH PROPOFOL N/A 09/26/2021   Procedure: COLONOSCOPY WITH PROPOFOL;  Surgeon: Jaynie Collins, DO;  Location: [facility] ENDOSCOPY;  Service: Endoscopy;  Laterality: N/A;   TUBAL LIGATION      Home Medications:  Allergies as of 06/14/2023       Reactions   Codeine    Dextroamphetamine    Nsaids    Morphine And Codeine Other (See Comments)   palpitations    Penicillins Other (See Comments)   Palpitations  Has patient had a PCN reaction causing immediate rash, facial/tongue/throat swelling, SOB or  lightheadedness with hypotension: Yes Has patient had a PCN reaction causing severe rash involving mucus membranes or skin necrosis: No Has patient had a PCN reaction that required hospitalization: No Has patient had a PCN reaction occurring within the last 10 years: No If all of the above answers are "NO", then may proceed with Cephalosporin use.   Tolmetin         Medication List        Accurate as of June 14, 2023 10:31 AM. If you have any questions, ask your nurse or doctor.          albuterol 108 (90 Base) MCG/ACT inhaler Commonly known as: VENTOLIN HFA Inhale 2 puffs into the lungs every 6 (six) hours as needed.   amiodarone 200 MG tablet Commonly known as: Pacerone Take 1 tablet (200  mg total) by mouth 2 (two) times daily.   benztropine 0.5 MG tablet Commonly known as: COGENTIN Take 0.5 mg by mouth daily.   cholecalciferol 10 MCG (400 UNIT) Tabs tablet Commonly known as: VITAMIN D3 Take 400 Units by mouth daily.   divalproex 250 MG DR tablet Commonly known as: DEPAKOTE Take 3 tablets (750 mg total) by mouth 2 (two) times daily.   Eliquis 5 MG Tabs tablet Generic drug: apixaban TAKE 1 TABLET BY MOUTH TWICE A DAY   fluticasone 50 MCG/ACT nasal spray Commonly known as: FLONASE Place 2 sprays into the nose daily.   furosemide 20 MG tablet Commonly known as: LASIX Take 1 tablet (20 mg total) by mouth every other day. Take 1 tablet by mouth twice daily for 1 week, then once daily for 1 week.   melatonin 3 MG Tabs tablet Take 1 tablet (3 mg total) by mouth at bedtime.   metoprolol succinate 50 MG 24 hr tablet Commonly known as: TOPROL-XL TAKE 1 TABLET BY MOUTH DAILY   OLANZapine 5 MG tablet Commonly known as: ZYPREXA Take by mouth.   pantoprazole 20 MG tablet Commonly known as: PROTONIX Take 20 mg by mouth daily.   solifenacin 5 MG tablet Commonly known as: VESICARE Take 1 tablet (5 mg total) by mouth daily.   Symbicort 80-4.5 MCG/ACT inhaler Generic drug: budesonide-formoterol Inhale into the lungs.   traZODone 50 MG tablet Commonly known as: DESYREL        Allergies:  Allergies  Allergen Reactions   Codeine    Dextroamphetamine    Nsaids    Morphine And Codeine Other (See Comments)    palpitations    Penicillins Other (See Comments)    Palpitations  Has patient had a PCN reaction causing immediate rash, facial/tongue/throat swelling, SOB or lightheadedness with hypotension: Yes Has patient had a PCN reaction causing severe rash involving mucus membranes or skin necrosis: No Has patient had a PCN reaction that required hospitalization: No Has patient had a PCN reaction occurring within the last 10 years: No If all of the above  answers are "NO", then may proceed with Cephalosporin use.    Tolmetin     Family History: Family History  Problem Relation Age of Onset   Hypertension Mother    Diabetes Maternal Aunt    Diabetes Maternal Uncle    Hypertension Sister    Breast cancer Sister 25   Colon cancer Maternal Grandmother    Hypertension Brother    Depression Other    Colon polyps Other     Social History:  reports that she has never smoked. She has never used smokeless tobacco. She reports that she  does not drink alcohol and does not use drugs.  ROS:                                        Physical Exam: There were no vitals taken for this visit.  Constitutional:  Alert and oriented, No acute distress. HEENT: [place] AT, moist mucus membranes.  Trachea midline, no masses.   Laboratory Data: Lab Results  Component Value Date   WBC 8.2 01/01/2019   HGB 12.6 01/01/2019   HCT 38.4 01/01/2019   MCV 77.0 (L) 01/01/2019   PLT 156 01/01/2019    Lab Results  Component Value Date   CREATININE 1.25 (H) 01/01/2019    No results found for: "PSA"  No results found for: "TESTOSTERONE"  Lab Results  Component Value Date   HGBA1C 4.9 06/25/2018    Urinalysis    Component Value Date/Time   COLORURINE YELLOW (A) 06/06/2020 1439   APPEARANCEUR Hazy (A) 12/16/2020 1542   LABSPEC 1.014 06/06/2020 1439   LABSPEC 1.014 10/24/2014 1544   PHURINE 6.0 06/06/2020 1439   GLUCOSEU Negative 12/16/2020 1542   GLUCOSEU Negative 10/24/2014 1544   HGBUR LARGE (A) 06/06/2020 1439   BILIRUBINUR Negative 12/16/2020 1542   BILIRUBINUR Negative 10/24/2014 1544   KETONESUR NEGATIVE 06/06/2020 1439   PROTEINUR Negative 12/16/2020 1542   PROTEINUR 30 (A) 06/06/2020 1439   NITRITE Negative 12/16/2020 1542   NITRITE NEGATIVE 06/06/2020 1439   LEUKOCYTESUR 1+ (A) 12/16/2020 1542   LEUKOCYTESUR LARGE (A) 06/06/2020 1439   LEUKOCYTESUR 3+ 10/24/2014 1544    Pertinent Imaging:   Assessment &  Plan: Vesicare renewed and I will see in a year  1. Mixed incontinence  - Urinalysis, Complete   No follow-ups on file.  Martina Sinner, MD  [facility] [address] [place], [place] [address] [phone]

## 2023-06-30 ENCOUNTER — Other Ambulatory Visit: Payer: Self-pay | Admitting: Cardiovascular Disease

## 2023-06-30 ENCOUNTER — Other Ambulatory Visit: Payer: Self-pay | Admitting: Family

## 2023-06-30 DIAGNOSIS — I639 Cerebral infarction, unspecified: Secondary | ICD-10-CM

## 2023-07-01 ENCOUNTER — Other Ambulatory Visit: Payer: Self-pay | Admitting: Family

## 2023-07-02 ENCOUNTER — Ambulatory Visit (INDEPENDENT_AMBULATORY_CARE_PROVIDER_SITE_OTHER): Payer: 59 | Admitting: Cardiovascular Disease

## 2023-07-02 ENCOUNTER — Encounter: Payer: Self-pay | Admitting: Cardiovascular Disease

## 2023-07-02 VITALS — BP 130/70 | HR 77 | Ht 66.0 in | Wt 270.6 lb

## 2023-07-02 DIAGNOSIS — I482 Chronic atrial fibrillation, unspecified: Secondary | ICD-10-CM

## 2023-07-02 DIAGNOSIS — I2510 Atherosclerotic heart disease of native coronary artery without angina pectoris: Secondary | ICD-10-CM

## 2023-07-02 DIAGNOSIS — I10 Essential (primary) hypertension: Secondary | ICD-10-CM | POA: Diagnosis not present

## 2023-07-02 DIAGNOSIS — I5033 Acute on chronic diastolic (congestive) heart failure: Secondary | ICD-10-CM

## 2023-07-02 DIAGNOSIS — R42 Dizziness and giddiness: Secondary | ICD-10-CM | POA: Diagnosis not present

## 2023-07-02 MED ORDER — SPIRONOLACTONE 25 MG PO TABS
25.0000 mg | ORAL_TABLET | Freq: Every day | ORAL | 11 refills | Status: DC
Start: 2023-07-02 — End: 2023-11-08

## 2023-07-02 MED ORDER — FUROSEMIDE 20 MG PO TABS
20.0000 mg | ORAL_TABLET | ORAL | 0 refills | Status: DC
Start: 2023-07-02 — End: 2023-07-29

## 2023-07-02 NOTE — Progress Notes (Signed)
Cardiology Office Note   Date:  07/02/2023   ID:  Carly, Marks 07-03-1956, MRN [id number]  PCP:  Emogene Morgan, MD  Cardiologist:  Adrian Blackwater, MD      History of Present Illness: Carly Marks is a 67 y.o. female who presents for  Chief Complaint  Patient presents with   Follow-up    1 mo    Gaining lot of weight      Past Medical History:  Diagnosis Date   Anemia    Anxiety    Arthritis    Asthma    CAD (coronary artery disease)    per patient   Depression    Family history of colonic polyps    GERD (gastroesophageal reflux disease)    HA (headache)    HTN (hypertension)    Major depressive disorder, recurrent episode, moderate (HCC)    Obstructive sleep apnea      Past Surgical History:  Procedure Laterality Date   CARDIAC CATHETERIZATION  02/19/15, 06/2009, 08/2011   COLONOSCOPY N/A 04/22/2015   Procedure: COLONOSCOPY;  Surgeon: Wallace Cullens, MD;  Location: [facility] ENDOSCOPY;  Service: Gastroenterology;  Laterality: N/A;   COLONOSCOPY WITH PROPOFOL N/A 09/26/2021   Procedure: COLONOSCOPY WITH PROPOFOL;  Surgeon: Jaynie Collins, DO;  Location: [facility] ENDOSCOPY;  Service: Endoscopy;  Laterality: N/A;   TUBAL LIGATION       Current Outpatient Medications  Medication Sig Dispense Refill   spironolactone (ALDACTONE) 25 MG tablet Take 1 tablet (25 mg total) by mouth daily. 30 tablet 11   albuterol (VENTOLIN HFA) 108 (90 Base) MCG/ACT inhaler Inhale 2 puffs into the lungs every 6 (six) hours as needed.     amiodarone (PACERONE) 200 MG tablet Take 1 tablet (200 mg total) by mouth 2 (two) times daily. 60 tablet 1   atorvastatin (LIPITOR) 40 MG tablet TAKE 1 TABLET BY MOUTH DAILY 30 tablet 3   benztropine (COGENTIN) 0.5 MG tablet Take 0.5 mg by mouth daily.     cholecalciferol (VITAMIN D) 400 UNITS TABS tablet Take 400 Units by mouth daily.      divalproex (DEPAKOTE) 250 MG DR tablet Take 3 tablets (750 mg total) by mouth 2 (two) times daily. 100  tablet 0   docusate sodium (COLACE) 100 MG capsule TAKE ONE CAPSULE BY MOUTH TWICE A DAY 100 capsule 1   ELIQUIS 5 MG TABS tablet TAKE 1 TABLET BY MOUTH TWICE A DAY 60 tablet 2   fluticasone (FLONASE) 50 MCG/ACT nasal spray Place 2 sprays into the nose daily.     furosemide (LASIX) 20 MG tablet Take 1 tablet (20 mg total) by mouth every other day. Take 1 tablet by mouth twice daily for 1 week, then once daily for 1 week. 15 tablet 0   Melatonin 3 MG TABS Take 1 tablet (3 mg total) by mouth at bedtime. 30 tablet 1   metoprolol succinate (TOPROL-XL) 50 MG 24 hr tablet TAKE 1 TABLET BY MOUTH DAILY 30 tablet 2   OLANZapine (ZYPREXA) 5 MG tablet Take by mouth.     pantoprazole (PROTONIX) 20 MG tablet Take 20 mg by mouth daily.     solifenacin (VESICARE) 5 MG tablet Take 1 tablet (5 mg total) by mouth daily. 90 tablet 0   SYMBICORT 80-4.5 MCG/ACT inhaler Inhale into the lungs.     traZODone (DESYREL) 50 MG tablet      No current facility-administered medications for this visit.    Allergies:  Codeine, Dextroamphetamine, Nsaids, Morphine and codeine, Penicillins, and Tolmetin    Social History:   reports that she has never smoked. She has never used smokeless tobacco. She reports that she does not drink alcohol and does not use drugs.   Family History:  family history includes Breast cancer (age of onset: 25) in her sister; Colon cancer in her maternal grandmother; Colon polyps in an other family member; Depression in an other family member; Diabetes in her maternal aunt and maternal uncle; Hypertension in her brother, mother, and sister.    ROS:     Review of Systems  Constitutional: Negative.   HENT: Negative.    Eyes: Negative.   Respiratory: Negative.    Gastrointestinal: Negative.   Genitourinary: Negative.   Musculoskeletal: Negative.   Skin: Negative.   Neurological: Negative.   Endo/Heme/Allergies: Negative.   Psychiatric/Behavioral: Negative.    All other systems reviewed and  are negative.     All other systems are reviewed and negative.    PHYSICAL EXAM: VS:  BP 130/70   Pulse 77   Ht 5\' 6"  (1.676 m)   Wt 270 lb 9.6 oz (122.7 kg)   SpO2 95%   BMI 43.68 kg/m  , BMI Body mass index is 43.68 kg/m. Last weight:  Wt Readings from Last 3 Encounters:  07/02/23 270 lb 9.6 oz (122.7 kg)  06/01/23 265 lb 6.4 oz (120.4 kg)  05/04/23 258 lb 12.8 oz (117.4 kg)     Physical Exam Constitutional:      Appearance: Normal appearance.  Cardiovascular:     Rate and Rhythm: Normal rate and regular rhythm.     Heart sounds: Normal heart sounds.  Pulmonary:     Effort: Pulmonary effort is normal.     Breath sounds: Normal breath sounds.  Musculoskeletal:     Right lower leg: No edema.     Left lower leg: No edema.  Neurological:     Mental Status: She is alert.       EKG:   Recent Labs: No results found for requested labs within last 365 days.    Lipid Panel    Component Value Date/Time   CHOL 108 06/25/2018 0442   CHOL 171 02/04/2015 0251   TRIG 27 06/25/2018 0442   TRIG 41 02/04/2015 0251   HDL 41 06/25/2018 0442   HDL 63 02/04/2015 0251   CHOLHDL 2.6 06/25/2018 0442   VLDL 5 06/25/2018 0442   VLDL 8 02/04/2015 0251   LDLCALC 62 06/25/2018 0442   LDLCALC 100 (H) 02/04/2015 0251      Other studies Reviewed: Additional studies/ records that were reviewed today include:  Review of the above records demonstrates:       No data to display            ASSESSMENT AND PLAN:    ICD-10-CM   1. Coronary artery disease involving native coronary artery of native heart without angina pectoris  I25.10 PCV ECHOCARDIOGRAM COMPLETE    spironolactone (ALDACTONE) 25 MG tablet    furosemide (LASIX) 20 MG tablet    2. Chronic atrial fibrillation (HCC)  I48.20 PCV ECHOCARDIOGRAM COMPLETE    spironolactone (ALDACTONE) 25 MG tablet    furosemide (LASIX) 20 MG tablet    3. Primary hypertension  I10 PCV ECHOCARDIOGRAM COMPLETE    spironolactone  (ALDACTONE) 25 MG tablet    furosemide (LASIX) 20 MG tablet    4. Dizziness  R42 PCV ECHOCARDIOGRAM COMPLETE    spironolactone (ALDACTONE) 25 MG  tablet    furosemide (LASIX) 20 MG tablet    5. CHF (congestive heart failure), NYHA class III, acute on chronic, diastolic (HCC)  I50.33 PCV ECHOCARDIOGRAM COMPLETE    spironolactone (ALDACTONE) 25 MG tablet    furosemide (LASIX) 20 MG tablet   Gaing weight Advise echo, add aldactone to lasix       Problem List Items Addressed This Visit       Cardiovascular and Mediastinum   CAD (coronary artery disease) - Primary   Relevant Medications   spironolactone (ALDACTONE) 25 MG tablet   furosemide (LASIX) 20 MG tablet   Other Relevant Orders   PCV ECHOCARDIOGRAM COMPLETE   HTN (hypertension)   Relevant Medications   spironolactone (ALDACTONE) 25 MG tablet   furosemide (LASIX) 20 MG tablet   Other Relevant Orders   PCV ECHOCARDIOGRAM COMPLETE   Chronic atrial fibrillation (HCC)   Relevant Medications   spironolactone (ALDACTONE) 25 MG tablet   furosemide (LASIX) 20 MG tablet   Other Relevant Orders   PCV ECHOCARDIOGRAM COMPLETE     Other   Dizziness   Relevant Medications   spironolactone (ALDACTONE) 25 MG tablet   furosemide (LASIX) 20 MG tablet   Other Relevant Orders   PCV ECHOCARDIOGRAM COMPLETE   Other Visit Diagnoses     CHF (congestive heart failure), NYHA class III, acute on chronic, diastolic (HCC)       Gaing weight Advise echo, add aldactone to lasix   Relevant Medications   spironolactone (ALDACTONE) 25 MG tablet   furosemide (LASIX) 20 MG tablet   Other Relevant Orders   PCV ECHOCARDIOGRAM COMPLETE          Disposition:   Return in about 4 weeks (around 07/30/2023) for echo, and F/u.    Total time spent: 30 minutes  Signed,  Adrian Blackwater, MD  07/02/2023 1:28 PM    [facility]

## 2023-07-15 ENCOUNTER — Ambulatory Visit (INDEPENDENT_AMBULATORY_CARE_PROVIDER_SITE_OTHER): Payer: 59

## 2023-07-15 DIAGNOSIS — I5033 Acute on chronic diastolic (congestive) heart failure: Secondary | ICD-10-CM

## 2023-07-15 DIAGNOSIS — I361 Nonrheumatic tricuspid (valve) insufficiency: Secondary | ICD-10-CM

## 2023-07-15 DIAGNOSIS — I482 Chronic atrial fibrillation, unspecified: Secondary | ICD-10-CM

## 2023-07-15 DIAGNOSIS — I422 Other hypertrophic cardiomyopathy: Secondary | ICD-10-CM | POA: Diagnosis not present

## 2023-07-15 DIAGNOSIS — R42 Dizziness and giddiness: Secondary | ICD-10-CM

## 2023-07-15 DIAGNOSIS — I2510 Atherosclerotic heart disease of native coronary artery without angina pectoris: Secondary | ICD-10-CM

## 2023-07-15 DIAGNOSIS — I10 Essential (primary) hypertension: Secondary | ICD-10-CM

## 2023-07-19 ENCOUNTER — Ambulatory Visit: Payer: Medicare Other | Admitting: Urology

## 2023-07-28 ENCOUNTER — Other Ambulatory Visit: Payer: Self-pay | Admitting: Cardiovascular Disease

## 2023-07-28 DIAGNOSIS — I10 Essential (primary) hypertension: Secondary | ICD-10-CM

## 2023-07-28 DIAGNOSIS — I2510 Atherosclerotic heart disease of native coronary artery without angina pectoris: Secondary | ICD-10-CM

## 2023-07-29 ENCOUNTER — Encounter: Payer: Self-pay | Admitting: Cardiovascular Disease

## 2023-07-29 ENCOUNTER — Emergency Department
Admission: EM | Admit: 2023-07-29 | Discharge: 2023-07-29 | Disposition: A | Discharge status: Home / Self Care | Payer: 59 | Attending: Emergency Medicine | Admitting: Emergency Medicine

## 2023-07-29 ENCOUNTER — Ambulatory Visit (INDEPENDENT_AMBULATORY_CARE_PROVIDER_SITE_OTHER): Payer: 59 | Admitting: Cardiovascular Disease

## 2023-07-29 VITALS — BP 132/83 | HR 83 | Ht 66.0 in | Wt 260.0 lb

## 2023-07-29 DIAGNOSIS — J45909 Unspecified asthma, uncomplicated: Secondary | ICD-10-CM | POA: Diagnosis not present

## 2023-07-29 DIAGNOSIS — I5033 Acute on chronic diastolic (congestive) heart failure: Secondary | ICD-10-CM

## 2023-07-29 DIAGNOSIS — R42 Dizziness and giddiness: Secondary | ICD-10-CM

## 2023-07-29 DIAGNOSIS — I482 Chronic atrial fibrillation, unspecified: Secondary | ICD-10-CM

## 2023-07-29 DIAGNOSIS — I4891 Unspecified atrial fibrillation: Secondary | ICD-10-CM | POA: Diagnosis not present

## 2023-07-29 DIAGNOSIS — I10 Essential (primary) hypertension: Secondary | ICD-10-CM | POA: Diagnosis not present

## 2023-07-29 DIAGNOSIS — R55 Syncope and collapse: Secondary | ICD-10-CM | POA: Insufficient documentation

## 2023-07-29 DIAGNOSIS — Z7901 Long term (current) use of anticoagulants: Secondary | ICD-10-CM | POA: Diagnosis not present

## 2023-07-29 DIAGNOSIS — R0602 Shortness of breath: Secondary | ICD-10-CM

## 2023-07-29 DIAGNOSIS — I2510 Atherosclerotic heart disease of native coronary artery without angina pectoris: Secondary | ICD-10-CM | POA: Insufficient documentation

## 2023-07-29 LAB — CBC WITH DIFFERENTIAL/PLATELET
Abs Immature Granulocytes: 0.03 10*3/uL (ref 0.00–0.07)
Basophils Absolute: 0 10*3/uL (ref 0.0–0.1)
Basophils Relative: 0 %
Eosinophils Absolute: 0.1 10*3/uL (ref 0.0–0.5)
Eosinophils Relative: 1 %
HCT: 40.2 % (ref 36.0–46.0)
Hemoglobin: 13.2 g/dL (ref 12.0–15.0)
Immature Granulocytes: 0 %
Lymphocytes Relative: 23 %
Lymphs Abs: 1.7 10*3/uL (ref 0.7–4.0)
MCH: 24.4 pg — ABNORMAL LOW (ref 26.0–34.0)
MCHC: 32.8 g/dL (ref 30.0–36.0)
MCV: 74.3 fL — ABNORMAL LOW (ref 80.0–100.0)
Monocytes Absolute: 1 10*3/uL (ref 0.1–1.0)
Monocytes Relative: 13 %
Neutro Abs: 4.6 10*3/uL (ref 1.7–7.7)
Neutrophils Relative %: 63 %
Platelets: 161 10*3/uL (ref 150–400)
RBC: 5.41 MIL/uL — ABNORMAL HIGH (ref 3.87–5.11)
RDW: 14.6 % (ref 11.5–15.5)
WBC: 7.4 10*3/uL (ref 4.0–10.5)
nRBC: 0 % (ref 0.0–0.2)

## 2023-07-29 LAB — VALPROIC ACID LEVEL: Valproic Acid Lvl: 82 ug/mL (ref 50.0–100.0)

## 2023-07-29 LAB — COMPREHENSIVE METABOLIC PANEL
ALT: 18 U/L (ref 0–44)
AST: 22 U/L (ref 15–41)
Albumin: 3.5 g/dL (ref 3.5–5.0)
Alkaline Phosphatase: 36 U/L — ABNORMAL LOW (ref 38–126)
Anion gap: 9 (ref 5–15)
BUN: 21 mg/dL (ref 8–23)
CO2: 26 mmol/L (ref 22–32)
Calcium: 8.6 mg/dL — ABNORMAL LOW (ref 8.9–10.3)
Chloride: 101 mmol/L (ref 98–111)
Creatinine, Ser: 1.74 mg/dL — ABNORMAL HIGH (ref 0.44–1.00)
GFR, Estimated: 32 mL/min — ABNORMAL LOW (ref >60)
Glucose, Bld: 98 mg/dL (ref 70–99)
Potassium: 4.8 mmol/L (ref 3.5–5.1)
Sodium: 136 mmol/L (ref 135–145)
Total Bilirubin: 1 mg/dL (ref 0.3–1.2)
Total Protein: 6.9 g/dL (ref 6.5–8.1)

## 2023-07-29 MED ORDER — FUROSEMIDE 20 MG PO TABS
20.0000 mg | ORAL_TABLET | ORAL | 0 refills | Status: DC
Start: 2023-07-29 — End: 2023-11-08

## 2023-07-29 NOTE — ED Triage Notes (Signed)
Pt BIBA from home where pt was getting ready for bed and reports feeling weak. Pt was assisted to the floor by family. Did not lose consciousness, or hit head. History of stroke in 2019. Denies deficits.

## 2023-07-29 NOTE — ED Provider Notes (Signed)
[facility] Provider Note    Event Date/Time   First MD Initiated Contact with Patient 07/29/23 1647     (approximate)   History   Chief Complaint Near Syncope   HPI  Carly Marks is a 67 y.o. female with past medical history of hypertension, CAD, atrial fibrillation on Eliquis, asthma, stroke, and bipolar disorder who presents to the ED for near syncope.  Patient reports that she was feeling well earlier in the day, returned home from an appointment with her cardiologist and began to feel weak in her legs.  She states that it felt like her legs "gave out on me" and she began to fall to the ground but was caught by her daughter.  She did not lose consciousness or hit her head, denies any chest pain or shortness of breath with this episode.  She states she now feels back to normal, denies any complaints.     Physical Exam   Triage Vital Signs: ED Triage Vitals  Encounter Vitals Group     BP --      Systolic BP Percentile --      Diastolic BP Percentile --      Pulse --      Resp --      Temp --      Temp src --      SpO2 07/29/23 1649 95 %     Weight 07/29/23 1655 260 lb 2.3 oz (118 kg)     Height 07/29/23 1655 5\' 6"  (1.676 m)     Head Circumference --      Peak Flow --      Pain Score 07/29/23 1655 0     Pain Loc --      Pain Education --      Exclude from Growth Chart --     Most recent vital signs: Vitals:   07/29/23 1649 07/29/23 1714  BP:  (!) 127/96  Pulse:  88  Resp:  18  Temp:  98.2 F (36.8 C)  SpO2: 95% 95%    Constitutional: Awake and alert. Eyes: Conjunctivae are normal. Head: Atraumatic. Nose: No congestion/rhinnorhea. Mouth/Throat: Mucous membranes are moist.  Cardiovascular: Normal rate, irregularly irregular rhythm. Grossly normal heart sounds.  2+ radial pulses bilaterally. Respiratory: Normal respiratory effort.  No retractions. Lungs CTAB. Gastrointestinal: Soft and nontender. No distention. Musculoskeletal:  No lower extremity tenderness nor edema.  Neurologic:  Normal speech and language. No gross focal neurologic deficits are appreciated.    ED Results / Procedures / Treatments   Labs (all labs ordered are listed, but only abnormal results are displayed) Labs Reviewed  CBC WITH DIFFERENTIAL/PLATELET - Abnormal; Notable for the following components:      Result Value   RBC 5.41 (*)    MCV 74.3 (*)    MCH 24.4 (*)    All other components within normal limits  COMPREHENSIVE METABOLIC PANEL - Abnormal; Notable for the following components:   Creatinine, Ser 1.74 (*)    Calcium 8.6 (*)    Alkaline Phosphatase 36 (*)    GFR, Estimated 32 (*)    All other components within normal limits  VALPROIC ACID LEVEL     EKG  ED ECG REPORT I, Chesley Noon, the attending physician, personally viewed and interpreted this ECG.   Date: 07/29/2023  EKG Time: 17:01  Rate: 89  Rhythm: atrial fibrillation  Axis: Normal  Intervals:none  ST&T Change: None  PROCEDURES:  Critical Care performed: No  Procedures   MEDICATIONS ORDERED IN ED: Medications - No data to display   IMPRESSION / MDM / ASSESSMENT AND PLAN / ED COURSE  I reviewed the triage vital signs and the nursing notes.                              67 y.o. female with past medical history of hypertension, CAD, chronic atrial fibrillation on Eliquis, asthma, stroke, and bipolar disorder who presents to the ED for episode of weakness and her legs giving out on her just prior to arrival.  Patient's presentation is most consistent with acute presentation with potential threat to life or bodily function.  Differential diagnosis includes, but is not limited to, arrhythmia, anemia, electrolyte abnormality, AKI, Depakote toxicity.  Patient well-appearing and in no acute distress, vital signs are unremarkable.  EKG shows atrial fibrillation with no ischemic changes, rate well-controlled currently.  Patient currently denies any  complaints, denies any chest pain or shortness of breath with this episode and I have low suspicion for ACS.  We will observe on cardiac monitor and screening labs including Depakote level.  Labs show CKD similar to previous with no acute electrolyte abnormality, anemia, or leukocytosis.  LFTs are unremarkable, Depakote level is therapeutic.  Patient remains awake and alert here in the ED with no complaints, daughter is now at bedside and clarifies that patient was on the toilet when she suddenly seemed to slump over, may have briefly been unconscious.  Daughter does not report significant trauma, with the patient being on the toilet I suspect vasovagal episode.  Patient able to ambulate with assistance of walker here in the ED.  She is appropriate for discharge home with outpatient follow-up, was counseled to return to the ED for new or worsening symptoms.  Patient and daughter agree with plan.      FINAL CLINICAL IMPRESSION(S) / ED DIAGNOSES   Final diagnoses:  Near syncope  Vasovagal episode     Rx / DC Orders   ED Discharge Orders     None        Note:  This document was prepared using Dragon voice recognition software and may include unintentional dictation errors.   Chesley Noon, MD 07/29/23 [phone]

## 2023-07-29 NOTE — ED Notes (Signed)
Patient was ambulated with a walker down the hallway and back.  Patient was stable, and able to do so with no distress.  Family went on to ambulate her further just to see how she would do without staff present.

## 2023-07-29 NOTE — Progress Notes (Signed)
Cardiology Office Note   Date:  07/29/2023   ID:  Carly Marks, DOB 06-26-1956, MRN [id number]  PCP:  Emogene Morgan, MD  Cardiologist:  Adrian Blackwater, MD      History of Present Illness: Carly Marks is a 67 y.o. female who presents for No chief complaint on file.   Still DOE, but feels better      Past Medical History:  Diagnosis Date   Anemia    Anxiety    Arthritis    Asthma    CAD (coronary artery disease)    per patient   Depression    Family history of colonic polyps    GERD (gastroesophageal reflux disease)    HA (headache)    HTN (hypertension)    Major depressive disorder, recurrent episode, moderate (HCC)    Obstructive sleep apnea      Past Surgical History:  Procedure Laterality Date   CARDIAC CATHETERIZATION  02/19/15, 06/2009, 08/2011   COLONOSCOPY N/A 04/22/2015   Procedure: COLONOSCOPY;  Surgeon: Wallace Cullens, MD;  Location: [facility] ENDOSCOPY;  Service: Gastroenterology;  Laterality: N/A;   COLONOSCOPY WITH PROPOFOL N/A 09/26/2021   Procedure: COLONOSCOPY WITH PROPOFOL;  Surgeon: Jaynie Collins, DO;  Location: [facility] ENDOSCOPY;  Service: Endoscopy;  Laterality: N/A;   TUBAL LIGATION       Current Outpatient Medications  Medication Sig Dispense Refill   albuterol (VENTOLIN HFA) 108 (90 Base) MCG/ACT inhaler Inhale 2 puffs into the lungs every 6 (six) hours as needed.     amiodarone (PACERONE) 200 MG tablet Take 1 tablet (200 mg total) by mouth 2 (two) times daily. 60 tablet 1   atorvastatin (LIPITOR) 40 MG tablet TAKE 1 TABLET BY MOUTH DAILY 30 tablet 3   benztropine (COGENTIN) 0.5 MG tablet Take 0.5 mg by mouth daily.     cholecalciferol (VITAMIN D) 400 UNITS TABS tablet Take 400 Units by mouth daily.      divalproex (DEPAKOTE) 250 MG DR tablet Take 3 tablets (750 mg total) by mouth 2 (two) times daily. 100 tablet 0   docusate sodium (COLACE) 100 MG capsule TAKE ONE CAPSULE BY MOUTH TWICE A DAY 100 capsule 1   ELIQUIS 5 MG TABS tablet  TAKE 1 TABLET BY MOUTH TWICE A DAY 60 tablet 2   fluticasone (FLONASE) 50 MCG/ACT nasal spray Place 2 sprays into the nose daily.     furosemide (LASIX) 20 MG tablet Take 1 tablet (20 mg total) by mouth every other day. Take 1 tablet by mouth twice daily for 1 week, then once daily for 1 week. 15 tablet 0   Melatonin 3 MG TABS Take 1 tablet (3 mg total) by mouth at bedtime. 30 tablet 1   metoprolol succinate (TOPROL-XL) 50 MG 24 hr tablet TAKE 1 TABLET BY MOUTH DAILY 30 tablet 2   OLANZapine (ZYPREXA) 5 MG tablet Take by mouth.     pantoprazole (PROTONIX) 20 MG tablet Take 20 mg by mouth daily.     solifenacin (VESICARE) 5 MG tablet Take 1 tablet (5 mg total) by mouth daily. 90 tablet 0   spironolactone (ALDACTONE) 25 MG tablet Take 1 tablet (25 mg total) by mouth daily. 30 tablet 11   SYMBICORT 80-4.5 MCG/ACT inhaler Inhale into the lungs.     traZODone (DESYREL) 50 MG tablet      No current facility-administered medications for this visit.    Allergies:   Codeine, Dextroamphetamine, Nsaids, Morphine and codeine, Penicillins, and Tolmetin  Social History:   reports that she has never smoked. She has never used smokeless tobacco. She reports that she does not drink alcohol and does not use drugs.   Family History:  family history includes Breast cancer (age of onset: 34) in her sister; Colon cancer in her maternal grandmother; Colon polyps in an other family member; Depression in an other family member; Diabetes in her maternal aunt and maternal uncle; Hypertension in her brother, mother, and sister.    ROS:     Review of Systems  Constitutional: Negative.   HENT: Negative.    Eyes: Negative.   Respiratory: Negative.    Gastrointestinal: Negative.   Genitourinary: Negative.   Musculoskeletal: Negative.   Skin: Negative.   Neurological: Negative.   Endo/Heme/Allergies: Negative.   Psychiatric/Behavioral: Negative.    All other systems reviewed and are negative.     All other  systems are reviewed and negative.    PHYSICAL EXAM: VS:  BP 132/83   Pulse 83   Ht 5\' 6"  (1.676 m)   Wt 260 lb (117.9 kg)   SpO2 97%   BMI 41.97 kg/m  , BMI Body mass index is 41.97 kg/m. Last weight:  Wt Readings from Last 3 Encounters:  07/29/23 260 lb (117.9 kg)  07/02/23 270 lb 9.6 oz (122.7 kg)  06/01/23 265 lb 6.4 oz (120.4 kg)     Physical Exam Constitutional:      Appearance: Normal appearance.  Cardiovascular:     Rate and Rhythm: Normal rate and regular rhythm.     Heart sounds: Normal heart sounds.  Pulmonary:     Effort: Pulmonary effort is normal.     Breath sounds: Normal breath sounds.  Musculoskeletal:     Right lower leg: No edema.     Left lower leg: No edema.  Neurological:     Mental Status: She is alert.       EKG:   Recent Labs: No results found for requested labs within last 365 days.    Lipid Panel    Component Value Date/Time   CHOL 108 06/25/2018 0442   CHOL 171 02/04/2015 0251   TRIG 27 06/25/2018 0442   TRIG 41 02/04/2015 0251   HDL 41 06/25/2018 0442   HDL 63 02/04/2015 0251   CHOLHDL 2.6 06/25/2018 0442   VLDL 5 06/25/2018 0442   VLDL 8 02/04/2015 0251   LDLCALC 62 06/25/2018 0442   LDLCALC 100 (H) 02/04/2015 0251      Other studies Reviewed: Additional studies/ records that were reviewed today include:  Review of the above records demonstrates:       No data to display            ASSESSMENT AND PLAN:    ICD-10-CM   1. Coronary artery disease involving native coronary artery of native heart without angina pectoris  I25.10 Comprehensive metabolic panel    CBC with Differential/Platelet    furosemide (LASIX) 20 MG tablet    2. Chronic atrial fibrillation (HCC)  I48.20 Comprehensive metabolic panel    CBC with Differential/Platelet    furosemide (LASIX) 20 MG tablet    3. Primary hypertension  I10 Comprehensive metabolic panel    CBC with Differential/Platelet    furosemide (LASIX) 20 MG tablet    4.  CHF (congestive heart failure), NYHA class III, acute on chronic, diastolic (HCC)  I50.33 Comprehensive metabolic panel    CBC with Differential/Platelet    furosemide (LASIX) 20 MG tablet    5.  SOB (shortness of breath)  R06.02 Comprehensive metabolic panel    CBC with Differential/Platelet    furosemide (LASIX) 20 MG tablet    6. Dizziness  R42 Comprehensive metabolic panel    CBC with Differential/Platelet    furosemide (LASIX) 20 MG tablet    7. CHF (congestive heart failure), NYHA class III, acute on chronic, diastolic (HCC)  I50.33 Comprehensive metabolic panel    CBC with Differential/Platelet    furosemide (LASIX) 20 MG tablet   Gaing weight Advise echo, add aldactone to lasix       Problem List Items Addressed This Visit       Cardiovascular and Mediastinum   CAD (coronary artery disease) - Primary   Relevant Medications   furosemide (LASIX) 20 MG tablet   Other Relevant Orders   Comprehensive metabolic panel   CBC with Differential/Platelet   HTN (hypertension)   Relevant Medications   furosemide (LASIX) 20 MG tablet   Other Relevant Orders   Comprehensive metabolic panel   CBC with Differential/Platelet   Chronic atrial fibrillation (HCC)   Relevant Medications   furosemide (LASIX) 20 MG tablet   Other Relevant Orders   Comprehensive metabolic panel   CBC with Differential/Platelet     Other   Dizziness   Relevant Medications   furosemide (LASIX) 20 MG tablet   Other Relevant Orders   Comprehensive metabolic panel   CBC with Differential/Platelet   SOB (shortness of breath)   Relevant Medications   furosemide (LASIX) 20 MG tablet   Other Relevant Orders   Comprehensive metabolic panel   CBC with Differential/Platelet   Other Visit Diagnoses     CHF (congestive heart failure), NYHA class III, acute on chronic, diastolic (HCC)       Relevant Medications   furosemide (LASIX) 20 MG tablet   Other Relevant Orders   Comprehensive metabolic panel    CBC with Differential/Platelet   CHF (congestive heart failure), NYHA class III, acute on chronic, diastolic (HCC)       Gaing weight Advise echo, add aldactone to lasix   Relevant Medications   furosemide (LASIX) 20 MG tablet   Other Relevant Orders   Comprehensive metabolic panel   CBC with Differential/Platelet          Disposition:   No follow-ups on file.    Total time spent: 40 minutes  Signed,  Adrian Blackwater, MD  07/29/2023 2:09 PM    [facility]

## 2023-09-03 ENCOUNTER — Encounter: Payer: Self-pay | Admitting: Cardiology

## 2023-09-03 ENCOUNTER — Ambulatory Visit (INDEPENDENT_AMBULATORY_CARE_PROVIDER_SITE_OTHER): Payer: 59 | Admitting: Cardiology

## 2023-09-03 VITALS — BP 138/72 | HR 90 | Ht 66.0 in | Wt 260.0 lb

## 2023-09-03 DIAGNOSIS — I2510 Atherosclerotic heart disease of native coronary artery without angina pectoris: Secondary | ICD-10-CM

## 2023-09-03 DIAGNOSIS — I10 Essential (primary) hypertension: Secondary | ICD-10-CM | POA: Diagnosis not present

## 2023-09-03 DIAGNOSIS — I482 Chronic atrial fibrillation, unspecified: Secondary | ICD-10-CM | POA: Diagnosis not present

## 2023-09-03 NOTE — Assessment & Plan Note (Addendum)
Denies chest pain. Short of breath on exertion only.

## 2023-09-03 NOTE — Assessment & Plan Note (Signed)
Patient in sinus rhythm on auscultation.

## 2023-09-03 NOTE — Progress Notes (Signed)
Cardiology Office Note   Date:  09/03/2023   ID:  Carly Marks, DOB [birth year], MRN [id number]  PCP:  Emogene Morgan, MD  Cardiologist:  Marisue Ivan, NP      History of Present Illness: Carly Marks is a 67 y.o. female who presents for  Chief Complaint  Patient presents with   Follow-up    Shacking and standing issues     Patient in office for one month follow up. Daughter brought patient to appointment today. Denies chest pain. Gets short of breath on exertion. Daughter states patient unable to walk on her own, gets weak and falls. Recommend discussing a PT and OT referral with her PCP.       Past Medical History:  Diagnosis Date   Anemia    Anxiety    Arthritis    Asthma    CAD (coronary artery disease)    per patient   Depression    Family history of colonic polyps    GERD (gastroesophageal reflux disease)    HA (headache)    HTN (hypertension)    Major depressive disorder, recurrent episode, moderate (HCC)    Obstructive sleep apnea      Past Surgical History:  Procedure Laterality Date   CARDIAC CATHETERIZATION  02/19/15, 06/2009, 08/2011   COLONOSCOPY N/A 04/22/2015   Procedure: COLONOSCOPY;  Surgeon: Wallace Cullens, MD;  Location: [facility] ENDOSCOPY;  Service: Gastroenterology;  Laterality: N/A;   COLONOSCOPY WITH PROPOFOL N/A 09/26/2021   Procedure: COLONOSCOPY WITH PROPOFOL;  Surgeon: Jaynie Collins, DO;  Location[facility] ENDOSCOPY;  Service: Endoscopy;  Laterality: N/A;   TUBAL LIGATION       Current Outpatient Medications  Medication Sig Dispense Refill   albuterol (VENTOLIN HFA) 108 (90 Base) MCG/ACT inhaler Inhale 2 puffs into the lungs every 6 (six) hours as needed.     amiodarone (PACERONE) 200 MG tablet Take 1 tablet (200 mg total) by mouth 2 (two) times daily. 60 tablet 1   atorvastatin (LIPITOR) 40 MG tablet TAKE 1 TABLET BY MOUTH DAILY 30 tablet 3   benztropine (COGENTIN) 0.5 MG tablet Take 0.5 mg by mouth daily.      cholecalciferol (VITAMIN D) 400 UNITS TABS tablet Take 400 Units by mouth daily.      divalproex (DEPAKOTE) 250 MG DR tablet Take 3 tablets (750 mg total) by mouth 2 (two) times daily. 100 tablet 0   docusate sodium (COLACE) 100 MG capsule TAKE ONE CAPSULE BY MOUTH TWICE A DAY 100 capsule 1   ELIQUIS 5 MG TABS tablet TAKE 1 TABLET BY MOUTH TWICE A DAY 60 tablet 2   fluticasone (FLONASE) 50 MCG/ACT nasal spray Place 2 sprays into the nose daily.     furosemide (LASIX) 20 MG tablet Take 1 tablet (20 mg total) by mouth every other day. Take 1 tablet by mouth twice daily for 1 week, then once daily for 1 week. 15 tablet 0   Melatonin 3 MG TABS Take 1 tablet (3 mg total) by mouth at bedtime. 30 tablet 1   metoprolol succinate (TOPROL-XL) 50 MG 24 hr tablet TAKE 1 TABLET BY MOUTH DAILY 30 tablet 2   OLANZapine (ZYPREXA) 5 MG tablet Take by mouth.     pantoprazole (PROTONIX) 20 MG tablet Take 20 mg by mouth daily.     solifenacin (VESICARE) 5 MG tablet Take 1 tablet (5 mg total) by mouth daily. 90 tablet 0   spironolactone (ALDACTONE) 25 MG tablet  Take 1 tablet (25 mg total) by mouth daily. 30 tablet 11   SYMBICORT 80-4.5 MCG/ACT inhaler Inhale into the lungs.     traZODone (DESYREL) 50 MG tablet      No current facility-administered medications for this visit.    Allergies:   Codeine, Dextroamphetamine, Nsaids, Morphine and codeine, Penicillins, and Tolmetin    Social History:   reports that she has never smoked. She has never used smokeless tobacco. She reports that she does not drink alcohol and does not use drugs.   Family History:  family history includes Breast cancer (age of onset: 48) in her sister; Colon cancer in her maternal grandmother; Colon polyps in an other family member; Depression in an other family member; Diabetes in her maternal aunt and maternal uncle; Hypertension in her brother, mother, and sister.    ROS:     Review of Systems  Constitutional: Negative.   HENT:  Negative.    Eyes: Negative.   Respiratory: Negative.    Cardiovascular: Negative.   Gastrointestinal: Negative.   Genitourinary: Negative.   Musculoskeletal: Negative.   Skin: Negative.   Neurological: Negative.   Endo/Heme/Allergies: Negative.   Psychiatric/Behavioral: Negative.    All other systems reviewed and are negative.     All other systems are reviewed and negative.    PHYSICAL EXAM: VS:  BP 138/72   Pulse 90   Ht 5\' 6"  (1.676 m)   Wt 260 lb (117.9 kg)   SpO2 92%   BMI 41.97 kg/m  , BMI Body mass index is 41.97 kg/m. Last weight:  Wt Readings from Last 3 Encounters:  09/03/23 260 lb (117.9 kg)  07/29/23 260 lb 2.3 oz (118 kg)  07/29/23 260 lb (117.9 kg)     Physical Exam Vitals and nursing note reviewed.  Constitutional:      Appearance: Normal appearance. She is normal weight.  HENT:     Head: Normocephalic and atraumatic.     Nose: Nose normal.     Mouth/Throat:     Mouth: Mucous membranes are moist.     Pharynx: Oropharynx is clear.  Eyes:     Conjunctiva/sclera: Conjunctivae normal.     Pupils: Pupils are equal, round, and reactive to light.  Cardiovascular:     Rate and Rhythm: Normal rate and regular rhythm.     Pulses: Normal pulses.     Heart sounds: Normal heart sounds.  Pulmonary:     Effort: Pulmonary effort is normal.     Breath sounds: Normal breath sounds.  Abdominal:     General: Abdomen is flat. Bowel sounds are normal.     Palpations: Abdomen is soft.  Musculoskeletal:        General: Normal range of motion.     Cervical back: Normal range of motion.  Skin:    General: Skin is warm and dry.  Neurological:     General: No focal deficit present.     Mental Status: She is alert and oriented to person, place, and time. Mental status is at baseline.  Psychiatric:        Mood and Affect: Mood normal.        Behavior: Behavior normal.      EKG: none today  Recent Labs: 07/29/2023: ALT 18; BUN 21; Creatinine, Ser 1.74;  Hemoglobin 13.2; Platelets 161; Potassium 4.8; Sodium 136    Lipid Panel    Component Value Date/Time   CHOL 108 06/25/2018 0442   CHOL 171 02/04/2015 0251   TRIG  27 06/25/2018 0442   TRIG 41 02/04/2015 0251   HDL 41 06/25/2018 0442   HDL 63 02/04/2015 0251   CHOLHDL 2.6 06/25/2018 0442   VLDL 5 06/25/2018 0442   VLDL 8 02/04/2015 0251   LDLCALC 62 06/25/2018 0442   LDLCALC 100 (H) 02/04/2015 0251      ASSESSMENT AND PLAN:    ICD-10-CM   1. Coronary artery disease involving native coronary artery of native heart without angina pectoris  I25.10     2. Primary hypertension  I10     3. Chronic atrial fibrillation (HCC)  I48.20        Problem List Items Addressed This Visit       Cardiovascular and Mediastinum   CAD (coronary artery disease) - Primary    Denies chest pain. Short of breath on exertion only.       HTN (hypertension)    Well controlled. Continue current medications.       Chronic atrial fibrillation (HCC)    Patient in sinus rhythm on auscultation.         Disposition:   Return in about 2 months (around 11/03/2023).    Total time spent: 25 minutes  Signed,  Marisue Ivan, NP  09/03/2023 2:31 PM    [facility]

## 2023-09-03 NOTE — Assessment & Plan Note (Signed)
Well controlled. Continue current medications  

## 2023-09-13 ENCOUNTER — Other Ambulatory Visit: Payer: Self-pay | Admitting: *Deleted

## 2023-09-13 DIAGNOSIS — N3946 Mixed incontinence: Secondary | ICD-10-CM

## 2023-09-13 MED ORDER — SOLIFENACIN SUCCINATE 5 MG PO TABS
5.0000 mg | ORAL_TABLET | Freq: Every day | ORAL | 3 refills | Status: DC
Start: 2023-09-13 — End: 2024-08-31

## 2023-09-20 ENCOUNTER — Other Ambulatory Visit: Payer: Self-pay | Admitting: Cardiovascular Disease

## 2023-09-20 DIAGNOSIS — I482 Chronic atrial fibrillation, unspecified: Secondary | ICD-10-CM

## 2023-09-20 DIAGNOSIS — I63429 Cerebral infarction due to embolism of unspecified anterior cerebral artery: Secondary | ICD-10-CM

## 2023-09-20 DIAGNOSIS — R42 Dizziness and giddiness: Secondary | ICD-10-CM

## 2023-09-20 DIAGNOSIS — R0602 Shortness of breath: Secondary | ICD-10-CM

## 2023-09-20 DIAGNOSIS — I10 Essential (primary) hypertension: Secondary | ICD-10-CM

## 2023-09-20 DIAGNOSIS — I2510 Atherosclerotic heart disease of native coronary artery without angina pectoris: Secondary | ICD-10-CM

## 2023-09-22 ENCOUNTER — Other Ambulatory Visit: Payer: Self-pay | Admitting: Cardiovascular Disease

## 2023-09-22 DIAGNOSIS — I639 Cerebral infarction, unspecified: Secondary | ICD-10-CM

## 2023-10-19 ENCOUNTER — Other Ambulatory Visit: Payer: Self-pay | Admitting: Cardiovascular Disease

## 2023-10-19 DIAGNOSIS — I10 Essential (primary) hypertension: Secondary | ICD-10-CM

## 2023-10-19 DIAGNOSIS — I2510 Atherosclerotic heart disease of native coronary artery without angina pectoris: Secondary | ICD-10-CM

## 2023-11-08 ENCOUNTER — Encounter: Payer: Self-pay | Admitting: Cardiovascular Disease

## 2023-11-08 ENCOUNTER — Ambulatory Visit (INDEPENDENT_AMBULATORY_CARE_PROVIDER_SITE_OTHER): Payer: 59 | Admitting: Cardiovascular Disease

## 2023-11-08 VITALS — BP 110/60 | Ht 64.0 in | Wt 263.0 lb

## 2023-11-08 DIAGNOSIS — I2510 Atherosclerotic heart disease of native coronary artery without angina pectoris: Secondary | ICD-10-CM

## 2023-11-08 DIAGNOSIS — R42 Dizziness and giddiness: Secondary | ICD-10-CM

## 2023-11-08 DIAGNOSIS — I63532 Cerebral infarction due to unspecified occlusion or stenosis of left posterior cerebral artery: Secondary | ICD-10-CM

## 2023-11-08 DIAGNOSIS — I10 Essential (primary) hypertension: Secondary | ICD-10-CM

## 2023-11-08 DIAGNOSIS — I5033 Acute on chronic diastolic (congestive) heart failure: Secondary | ICD-10-CM

## 2023-11-08 DIAGNOSIS — I482 Chronic atrial fibrillation, unspecified: Secondary | ICD-10-CM

## 2023-11-08 DIAGNOSIS — R0602 Shortness of breath: Secondary | ICD-10-CM

## 2023-11-08 MED ORDER — FUROSEMIDE 20 MG PO TABS
ORAL_TABLET | ORAL | 0 refills | Status: DC
Start: 2023-11-08 — End: 2024-02-03

## 2023-11-08 MED ORDER — SPIRONOLACTONE 25 MG PO TABS: 12.5000 mg | ORAL_TABLET | Freq: Every day | ORAL | 11 refills | Status: AC

## 2023-11-08 NOTE — Progress Notes (Signed)
Cardiology Office Note   Date:  11/08/2023   ID:  Carly Marks, Carly Marks 18-Jun-1956, MRN [id number]  PCP:  Emogene Morgan, MD  Cardiologist:  Adrian Blackwater, MD      History of Present Illness: Carly Marks is a 67 y.o. female who presents for  Chief Complaint  Patient presents with   Follow-up    2 Months Follow Up    Feeling weak as BP been low, has not been dizzy.      Past Medical History:  Diagnosis Date   Anemia    Anxiety    Arthritis    Asthma    CAD (coronary artery disease)    per patient   Depression    Family history of colonic polyps    GERD (gastroesophageal reflux disease)    HA (headache)    HTN (hypertension)    Major depressive disorder, recurrent episode, moderate (HCC)    Obstructive sleep apnea      Past Surgical History:  Procedure Laterality Date   CARDIAC CATHETERIZATION  02/19/15, 06/2009, 08/2011   COLONOSCOPY N/A 04/22/2015   Procedure: COLONOSCOPY;  Surgeon: Wallace Cullens, MD;  Location: [facility] ENDOSCOPY;  Service: Gastroenterology;  Laterality: N/A;   COLONOSCOPY WITH PROPOFOL N/A 09/26/2021   Procedure: COLONOSCOPY WITH PROPOFOL;  Surgeon: Jaynie Collins, DO;  Location: [facility] ENDOSCOPY;  Service: Endoscopy;  Laterality: N/A;   TUBAL LIGATION       Current Outpatient Medications  Medication Sig Dispense Refill   albuterol (VENTOLIN HFA) 108 (90 Base) MCG/ACT inhaler Inhale 2 puffs into the lungs every 6 (six) hours as needed.     amiodarone (PACERONE) 200 MG tablet TAKE 1 TABLET BY MOUTH TWICE A DAY 30 tablet 2   atorvastatin (LIPITOR) 40 MG tablet TAKE 1 TABLET BY MOUTH DAILY 30 tablet 3   benztropine (COGENTIN) 0.5 MG tablet Take 0.5 mg by mouth daily.     cholecalciferol (VITAMIN D) 400 UNITS TABS tablet Take 400 Units by mouth daily.      divalproex (DEPAKOTE) 250 MG DR tablet Take 3 tablets (750 mg total) by mouth 2 (two) times daily. 100 tablet 0   docusate sodium (COLACE) 100 MG capsule TAKE ONE CAPSULE BY MOUTH TWICE  A DAY 100 capsule 1   ELIQUIS 5 MG TABS tablet TAKE 1 TABLET BY MOUTH TWICE A DAY 60 tablet 2   fluticasone (FLONASE) 50 MCG/ACT nasal spray Place 2 sprays into the nose daily.     furosemide (LASIX) 20 MG tablet Take 1 tablet by mouth daily as needed 15 tablet 0   Melatonin 3 MG TABS Take 1 tablet (3 mg total) by mouth at bedtime. 30 tablet 1   metoprolol succinate (TOPROL-XL) 50 MG 24 hr tablet TAKE 1 TABLET BY MOUTH DAILY 30 tablet 2   OLANZapine (ZYPREXA) 5 MG tablet Take by mouth.     pantoprazole (PROTONIX) 20 MG tablet Take 20 mg by mouth daily.     solifenacin (VESICARE) 5 MG tablet Take 1 tablet (5 mg total) by mouth daily. 90 tablet 3   spironolactone (ALDACTONE) 25 MG tablet Take 0.5 tablets (12.5 mg total) by mouth daily. 30 tablet 11   SYMBICORT 80-4.5 MCG/ACT inhaler Inhale into the lungs.     traZODone (DESYREL) 50 MG tablet      No current facility-administered medications for this visit.    Allergies:   Codeine, Dextroamphetamine, Nsaids, Morphine and codeine, Penicillins, and Tolmetin    Social History:  reports that she has never smoked. She has never used smokeless tobacco. She reports that she does not drink alcohol and does not use drugs.   Family History:  family history includes Breast cancer (age of onset: 69) in her sister; Colon cancer in her maternal grandmother; Colon polyps in an other family member; Depression in an other family member; Diabetes in her maternal aunt and maternal uncle; Hypertension in her brother, mother, and sister.    ROS:     Review of Systems  Constitutional: Negative.   HENT: Negative.    Eyes: Negative.   Respiratory: Negative.    Gastrointestinal: Negative.   Genitourinary: Negative.   Musculoskeletal: Negative.   Skin: Negative.   Neurological: Negative.   Endo/Heme/Allergies: Negative.   Psychiatric/Behavioral: Negative.    All other systems reviewed and are negative.     All other systems are reviewed and negative.     PHYSICAL EXAM: VS:  BP 110/60   Ht 5\' 4"  (1.626 m)   Wt 263 lb (119.3 kg)   BMI 45.14 kg/m  , BMI Body mass index is 45.14 kg/m. Last weight:  Wt Readings from Last 3 Encounters:  11/08/23 263 lb (119.3 kg)  09/03/23 260 lb (117.9 kg)  07/29/23 260 lb 2.3 oz (118 kg)     Physical Exam Constitutional:      Appearance: Normal appearance.  Cardiovascular:     Rate and Rhythm: Normal rate and regular rhythm.     Heart sounds: Normal heart sounds.  Pulmonary:     Effort: Pulmonary effort is normal.     Breath sounds: Normal breath sounds.  Musculoskeletal:     Right lower leg: No edema.     Left lower leg: No edema.  Neurological:     Mental Status: She is alert.       EKG:   Recent Labs: 07/29/2023: ALT 18; BUN 21; Creatinine, Ser 1.74; Hemoglobin 13.2; Platelets 161; Potassium 4.8; Sodium 136    Lipid Panel    Component Value Date/Time   CHOL 108 06/25/2018 0442   CHOL 171 02/04/2015 0251   TRIG 27 06/25/2018 0442   TRIG 41 02/04/2015 0251   HDL 41 06/25/2018 0442   HDL 63 02/04/2015 0251   CHOLHDL 2.6 06/25/2018 0442   VLDL 5 06/25/2018 0442   VLDL 8 02/04/2015 0251   LDLCALC 62 06/25/2018 0442   LDLCALC 100 (H) 02/04/2015 0251      Other studies Reviewed: Additional studies/ records that were reviewed today include:  Review of the above records demonstrates:       No data to display            ASSESSMENT AND PLAN:    ICD-10-CM   1. Coronary artery disease involving native coronary artery of native heart without angina pectoris  I25.10 furosemide (LASIX) 20 MG tablet    spironolactone (ALDACTONE) 25 MG tablet    2. Cerebrovascular accident (CVA) due to occlusion of left posterior cerebral artery (HCC)  I63.532     3. Chronic atrial fibrillation (HCC)  I48.20 furosemide (LASIX) 20 MG tablet    spironolactone (ALDACTONE) 25 MG tablet    4. Primary hypertension  I10 furosemide (LASIX) 20 MG tablet    spironolactone (ALDACTONE) 25 MG  tablet    5. CHF (congestive heart failure), NYHA class III, acute on chronic, diastolic (HCC)  I50.33 furosemide (LASIX) 20 MG tablet    spironolactone (ALDACTONE) 25 MG tablet    6. SOB (shortness of breath)  R06.02 furosemide (LASIX) 20 MG tablet    7. Dizziness  R42 furosemide (LASIX) 20 MG tablet    spironolactone (ALDACTONE) 25 MG tablet   Has weakness as BP low, change lasix 20 mg as needed and aldactone to 12.5 mg daily    8. CHF (congestive heart failure), NYHA class III, acute on chronic, diastolic (HCC)  I50.33 furosemide (LASIX) 20 MG tablet    spironolactone (ALDACTONE) 25 MG tablet   Gaing weight Advise echo, add aldactone to lasix       Problem List Items Addressed This Visit       Cardiovascular and Mediastinum   CAD (coronary artery disease) - Primary   Relevant Medications   furosemide (LASIX) 20 MG tablet   spironolactone (ALDACTONE) 25 MG tablet   HTN (hypertension)   Relevant Medications   furosemide (LASIX) 20 MG tablet   spironolactone (ALDACTONE) 25 MG tablet   Cerebrovascular accident (CVA) due to occlusion of left posterior cerebral artery (HCC)   Relevant Medications   furosemide (LASIX) 20 MG tablet   spironolactone (ALDACTONE) 25 MG tablet   Chronic atrial fibrillation (HCC)   Relevant Medications   furosemide (LASIX) 20 MG tablet   spironolactone (ALDACTONE) 25 MG tablet     Other   Dizziness   Relevant Medications   furosemide (LASIX) 20 MG tablet   spironolactone (ALDACTONE) 25 MG tablet   SOB (shortness of breath)   Relevant Medications   furosemide (LASIX) 20 MG tablet   Other Visit Diagnoses       CHF (congestive heart failure), NYHA class III, acute on chronic, diastolic (HCC)       Relevant Medications   furosemide (LASIX) 20 MG tablet   spironolactone (ALDACTONE) 25 MG tablet     CHF (congestive heart failure), NYHA class III, acute on chronic, diastolic (HCC)       Gaing weight Advise echo, add aldactone to lasix   Relevant  Medications   furosemide (LASIX) 20 MG tablet   spironolactone (ALDACTONE) 25 MG tablet          Disposition:   Return in about 4 weeks (around 12/06/2023).    Total time spent: 30 minutes  Signed,  Adrian Blackwater, MD  11/08/2023 1:37 PM    [facility]

## 2023-11-16 ENCOUNTER — Other Ambulatory Visit: Payer: Self-pay | Admitting: Cardiovascular Disease

## 2023-11-16 DIAGNOSIS — I63429 Cerebral infarction due to embolism of unspecified anterior cerebral artery: Secondary | ICD-10-CM

## 2023-11-16 DIAGNOSIS — R42 Dizziness and giddiness: Secondary | ICD-10-CM

## 2023-11-16 DIAGNOSIS — I10 Essential (primary) hypertension: Secondary | ICD-10-CM

## 2023-11-16 DIAGNOSIS — I482 Chronic atrial fibrillation, unspecified: Secondary | ICD-10-CM

## 2023-11-16 DIAGNOSIS — I2510 Atherosclerotic heart disease of native coronary artery without angina pectoris: Secondary | ICD-10-CM

## 2023-11-16 DIAGNOSIS — R0602 Shortness of breath: Secondary | ICD-10-CM

## 2023-12-10 ENCOUNTER — Encounter: Payer: Self-pay | Admitting: Cardiovascular Disease

## 2023-12-10 ENCOUNTER — Ambulatory Visit: Payer: 59 | Admitting: Cardiovascular Disease

## 2023-12-10 VITALS — BP 132/71 | HR 74 | Ht 66.0 in | Wt 264.0 lb

## 2023-12-10 DIAGNOSIS — I10 Essential (primary) hypertension: Secondary | ICD-10-CM | POA: Diagnosis not present

## 2023-12-10 DIAGNOSIS — I63532 Cerebral infarction due to unspecified occlusion or stenosis of left posterior cerebral artery: Secondary | ICD-10-CM

## 2023-12-10 DIAGNOSIS — I482 Chronic atrial fibrillation, unspecified: Secondary | ICD-10-CM | POA: Diagnosis not present

## 2023-12-10 DIAGNOSIS — I2510 Atherosclerotic heart disease of native coronary artery without angina pectoris: Secondary | ICD-10-CM

## 2023-12-10 DIAGNOSIS — I340 Nonrheumatic mitral (valve) insufficiency: Secondary | ICD-10-CM

## 2023-12-10 DIAGNOSIS — I5033 Acute on chronic diastolic (congestive) heart failure: Secondary | ICD-10-CM

## 2023-12-10 NOTE — Progress Notes (Signed)
Cardiology Office Note   Date:  12/10/2023   ID:  Carly Marks, Carly Marks Mar 19, 1956, MRN [id number]  PCP:  Emogene Morgan, MD  Cardiologist:  Adrian Blackwater, MD      History of Present Illness: Carly Marks is a 68 y.o. female who presents for No chief complaint on file.   Has a cold      Past Medical History:  Diagnosis Date   Anemia    Anxiety    Arthritis    Asthma    CAD (coronary artery disease)    per patient   Depression    Family history of colonic polyps    GERD (gastroesophageal reflux disease)    HA (headache)    HTN (hypertension)    Major depressive disorder, recurrent episode, moderate (HCC)    Obstructive sleep apnea      Past Surgical History:  Procedure Laterality Date   CARDIAC CATHETERIZATION  02/19/15, 06/2009, 08/2011   COLONOSCOPY N/A 04/22/2015   Procedure: COLONOSCOPY;  Surgeon: Wallace Cullens, MD;  Location: [facility] ENDOSCOPY;  Service: Gastroenterology;  Laterality: N/A;   COLONOSCOPY WITH PROPOFOL N/A 09/26/2021   Procedure: COLONOSCOPY WITH PROPOFOL;  Surgeon: Jaynie Collins, DO;  Location: [facility] ENDOSCOPY;  Service: Endoscopy;  Laterality: N/A;   TUBAL LIGATION       Current Outpatient Medications  Medication Sig Dispense Refill   albuterol (VENTOLIN HFA) 108 (90 Base) MCG/ACT inhaler Inhale 2 puffs into the lungs every 6 (six) hours as needed.     amiodarone (PACERONE) 200 MG tablet TAKE 1 TABLET BY MOUTH TWICE A DAY 30 tablet 2   atorvastatin (LIPITOR) 40 MG tablet TAKE 1 TABLET BY MOUTH DAILY 30 tablet 3   benztropine (COGENTIN) 0.5 MG tablet Take 0.5 mg by mouth daily.     cholecalciferol (VITAMIN D) 400 UNITS TABS tablet Take 400 Units by mouth daily.      divalproex (DEPAKOTE) 250 MG DR tablet Take 3 tablets (750 mg total) by mouth 2 (two) times daily. 100 tablet 0   docusate sodium (COLACE) 100 MG capsule TAKE ONE CAPSULE BY MOUTH TWICE A DAY 100 capsule 1   ELIQUIS 5 MG TABS tablet TAKE 1 TABLET BY MOUTH TWICE A DAY 60  tablet 2   fluticasone (FLONASE) 50 MCG/ACT nasal spray Place 2 sprays into the nose daily.     furosemide (LASIX) 20 MG tablet Take 1 tablet by mouth daily as needed 15 tablet 0   Melatonin 3 MG TABS Take 1 tablet (3 mg total) by mouth at bedtime. 30 tablet 1   metoprolol succinate (TOPROL-XL) 50 MG 24 hr tablet TAKE 1 TABLET BY MOUTH DAILY 30 tablet 2   OLANZapine (ZYPREXA) 5 MG tablet Take by mouth.     pantoprazole (PROTONIX) 20 MG tablet Take 20 mg by mouth daily.     solifenacin (VESICARE) 5 MG tablet Take 1 tablet (5 mg total) by mouth daily. 90 tablet 3   spironolactone (ALDACTONE) 25 MG tablet Take 0.5 tablets (12.5 mg total) by mouth daily. 30 tablet 11   SYMBICORT 80-4.5 MCG/ACT inhaler Inhale into the lungs.     traZODone (DESYREL) 50 MG tablet      No current facility-administered medications for this visit.    Allergies:   Codeine, Dextroamphetamine, Nsaids, Morphine and codeine, Penicillins, and Tolmetin    Social History:   reports that she has never smoked. She has never used smokeless tobacco. She reports that she does not drink  alcohol and does not use drugs.   Family History:  family history includes Breast cancer (age of onset: 74) in her sister; Colon cancer in her maternal grandmother; Colon polyps in an other family member; Depression in an other family member; Diabetes in her maternal aunt and maternal uncle; Hypertension in her brother, mother, and sister.    ROS:     Review of Systems  Constitutional: Negative.   HENT: Negative.    Eyes: Negative.   Respiratory: Negative.    Gastrointestinal: Negative.   Genitourinary: Negative.   Musculoskeletal: Negative.   Skin: Negative.   Neurological: Negative.   Endo/Heme/Allergies: Negative.   Psychiatric/Behavioral: Negative.    All other systems reviewed and are negative.     All other systems are reviewed and negative.    PHYSICAL EXAM: VS:  BP 132/71   Pulse 74   Ht 5\' 6"  (1.676 m)   Wt 264 lb  (119.7 kg)   SpO2 95%   BMI 42.61 kg/m  , BMI Body mass index is 42.61 kg/m. Last weight:  Wt Readings from Last 3 Encounters:  12/10/23 264 lb (119.7 kg)  11/08/23 263 lb (119.3 kg)  09/03/23 260 lb (117.9 kg)     Physical Exam Constitutional:      Appearance: Normal appearance.  Cardiovascular:     Rate and Rhythm: Normal rate and regular rhythm.     Heart sounds: Normal heart sounds.  Pulmonary:     Effort: Pulmonary effort is normal.     Breath sounds: Normal breath sounds.  Musculoskeletal:     Right lower leg: No edema.     Left lower leg: No edema.  Neurological:     Mental Status: She is alert.       EKG:   Recent Labs: 07/29/2023: ALT 18; BUN 21; Creatinine, Ser 1.74; Hemoglobin 13.2; Platelets 161; Potassium 4.8; Sodium 136    Lipid Panel    Component Value Date/Time   CHOL 108 06/25/2018 0442   CHOL 171 02/04/2015 0251   TRIG 27 06/25/2018 0442   TRIG 41 02/04/2015 0251   HDL 41 06/25/2018 0442   HDL 63 02/04/2015 0251   CHOLHDL 2.6 06/25/2018 0442   VLDL 5 06/25/2018 0442   VLDL 8 02/04/2015 0251   LDLCALC 62 06/25/2018 0442   LDLCALC 100 (H) 02/04/2015 0251      Other studies Reviewed: Additional studies/ records that were reviewed today include:  Review of the above records demonstrates:       No data to display            ASSESSMENT AND PLAN:    ICD-10-CM   1. Coronary artery disease involving native coronary artery of native heart without angina pectoris  I25.10    stable    2. Cerebrovascular accident (CVA) due to occlusion of left posterior cerebral artery (HCC)  I63.532     3. Chronic atrial fibrillation (HCC)  I48.20    Controlled rate    4. Primary hypertension  I10     5. CHF (congestive heart failure), NYHA class III, acute on chronic, diastolic (HCC)  I50.33    Compensated    6. Nonrheumatic mitral valve regurgitation  I34.0    Trace       Problem List Items Addressed This Visit       Cardiovascular and  Mediastinum   CAD (coronary artery disease) - Primary   HTN (hypertension)   Cerebrovascular accident (CVA) due to occlusion of left posterior cerebral artery (  HCC)   Chronic atrial fibrillation (HCC)   Other Visit Diagnoses       CHF (congestive heart failure), NYHA class III, acute on chronic, diastolic (HCC)       Compensated     Nonrheumatic mitral valve regurgitation       Trace          Disposition:   No follow-ups on file.    Total time spent: 30 minutes  Signed,  Adrian Blackwater, MD  12/10/2023 2:19 PM    [facility]

## 2023-12-16 ENCOUNTER — Other Ambulatory Visit: Payer: Self-pay | Admitting: Cardiovascular Disease

## 2023-12-16 DIAGNOSIS — I639 Cerebral infarction, unspecified: Secondary | ICD-10-CM

## 2024-01-15 ENCOUNTER — Other Ambulatory Visit: Payer: Self-pay | Admitting: Cardiovascular Disease

## 2024-01-15 DIAGNOSIS — I10 Essential (primary) hypertension: Secondary | ICD-10-CM

## 2024-01-15 DIAGNOSIS — I2510 Atherosclerotic heart disease of native coronary artery without angina pectoris: Secondary | ICD-10-CM

## 2024-01-19 ENCOUNTER — Other Ambulatory Visit: Payer: Self-pay | Admitting: Cardiovascular Disease

## 2024-01-19 DIAGNOSIS — I482 Chronic atrial fibrillation, unspecified: Secondary | ICD-10-CM

## 2024-01-19 DIAGNOSIS — I63429 Cerebral infarction due to embolism of unspecified anterior cerebral artery: Secondary | ICD-10-CM

## 2024-01-19 DIAGNOSIS — I2510 Atherosclerotic heart disease of native coronary artery without angina pectoris: Secondary | ICD-10-CM

## 2024-01-19 DIAGNOSIS — R42 Dizziness and giddiness: Secondary | ICD-10-CM

## 2024-01-19 DIAGNOSIS — R0602 Shortness of breath: Secondary | ICD-10-CM

## 2024-01-19 DIAGNOSIS — I10 Essential (primary) hypertension: Secondary | ICD-10-CM

## 2024-02-03 ENCOUNTER — Other Ambulatory Visit: Payer: Self-pay | Admitting: Cardiovascular Disease

## 2024-02-03 DIAGNOSIS — R42 Dizziness and giddiness: Secondary | ICD-10-CM

## 2024-02-03 DIAGNOSIS — I5033 Acute on chronic diastolic (congestive) heart failure: Secondary | ICD-10-CM

## 2024-02-03 DIAGNOSIS — I10 Essential (primary) hypertension: Secondary | ICD-10-CM

## 2024-02-03 DIAGNOSIS — I2510 Atherosclerotic heart disease of native coronary artery without angina pectoris: Secondary | ICD-10-CM

## 2024-02-03 DIAGNOSIS — I482 Chronic atrial fibrillation, unspecified: Secondary | ICD-10-CM

## 2024-02-03 DIAGNOSIS — R0602 Shortness of breath: Secondary | ICD-10-CM

## 2024-02-11 ENCOUNTER — Other Ambulatory Visit: Payer: Self-pay | Admitting: Cardiovascular Disease

## 2024-03-03 ENCOUNTER — Other Ambulatory Visit: Payer: Self-pay | Admitting: Family Medicine

## 2024-03-03 DIAGNOSIS — Z1231 Encounter for screening mammogram for malignant neoplasm of breast: Secondary | ICD-10-CM

## 2024-03-10 ENCOUNTER — Other Ambulatory Visit: Payer: Self-pay | Admitting: Cardiovascular Disease

## 2024-03-10 DIAGNOSIS — I639 Cerebral infarction, unspecified: Secondary | ICD-10-CM

## 2024-03-21 ENCOUNTER — Other Ambulatory Visit: Payer: Self-pay | Admitting: Cardiovascular Disease

## 2024-03-21 ENCOUNTER — Other Ambulatory Visit: Payer: Self-pay

## 2024-03-21 DIAGNOSIS — I482 Chronic atrial fibrillation, unspecified: Secondary | ICD-10-CM

## 2024-03-21 DIAGNOSIS — R0602 Shortness of breath: Secondary | ICD-10-CM

## 2024-03-21 DIAGNOSIS — I63429 Cerebral infarction due to embolism of unspecified anterior cerebral artery: Secondary | ICD-10-CM

## 2024-03-21 DIAGNOSIS — I2510 Atherosclerotic heart disease of native coronary artery without angina pectoris: Secondary | ICD-10-CM

## 2024-03-21 DIAGNOSIS — I10 Essential (primary) hypertension: Secondary | ICD-10-CM

## 2024-03-21 DIAGNOSIS — I5033 Acute on chronic diastolic (congestive) heart failure: Secondary | ICD-10-CM

## 2024-03-21 DIAGNOSIS — R42 Dizziness and giddiness: Secondary | ICD-10-CM

## 2024-03-22 MED ORDER — FUROSEMIDE 20 MG PO TABS: 20.0000 mg | ORAL_TABLET | Freq: Every day | ORAL | 0 refills | Status: DC | PRN

## 2024-03-23 ENCOUNTER — Ambulatory Visit (INDEPENDENT_AMBULATORY_CARE_PROVIDER_SITE_OTHER): Admitting: Cardiovascular Disease

## 2024-03-23 ENCOUNTER — Encounter: Payer: Self-pay | Admitting: Cardiovascular Disease

## 2024-03-23 VITALS — BP 122/80 | HR 70 | Ht 62.0 in | Wt 253.0 lb

## 2024-03-23 DIAGNOSIS — I10 Essential (primary) hypertension: Secondary | ICD-10-CM

## 2024-03-23 DIAGNOSIS — I5033 Acute on chronic diastolic (congestive) heart failure: Secondary | ICD-10-CM

## 2024-03-23 DIAGNOSIS — I63532 Cerebral infarction due to unspecified occlusion or stenosis of left posterior cerebral artery: Secondary | ICD-10-CM | POA: Diagnosis not present

## 2024-03-23 DIAGNOSIS — I2510 Atherosclerotic heart disease of native coronary artery without angina pectoris: Secondary | ICD-10-CM

## 2024-03-23 DIAGNOSIS — R0602 Shortness of breath: Secondary | ICD-10-CM

## 2024-03-23 DIAGNOSIS — I340 Nonrheumatic mitral (valve) insufficiency: Secondary | ICD-10-CM

## 2024-03-23 DIAGNOSIS — I482 Chronic atrial fibrillation, unspecified: Secondary | ICD-10-CM

## 2024-03-23 NOTE — Progress Notes (Addendum)
 Cardiology Office Note   Date:  03/23/2024   ID:  Carly Marks, DOB 1956-05-31, MRN [id number]  PCP:  Lorina Roosevelt, MD  Cardiologist:  Debborah Fairly, MD      History of Present Illness: Carly Marks is a 68 y.o. female who presents for  Chief Complaint  Patient presents with   Follow-up   Acute Visit    Bilateral leg swelling, weak, discuss meds    Has swelling of legs and unable to take lasix  every day but aldactone  taking  regularly.      Past Medical History:  Diagnosis Date   Anemia    Anxiety    Arthritis    Asthma    CAD (coronary artery disease)    per patient   Depression    Family history of colonic polyps    GERD (gastroesophageal reflux disease)    HA (headache)    HTN (hypertension)    Major depressive disorder, recurrent episode, moderate (HCC)    Obstructive sleep apnea      Past Surgical History:  Procedure Laterality Date   CARDIAC CATHETERIZATION  02/19/15, 06/2009, 08/2011   COLONOSCOPY N/A 04/22/2015   Procedure: COLONOSCOPY;  Surgeon: Stephens Eis, MD;  Location: [facility] ENDOSCOPY;  Service: Gastroenterology;  Laterality: N/A;   COLONOSCOPY WITH PROPOFOL  N/A 09/26/2021   Procedure: COLONOSCOPY WITH PROPOFOL ;  Surgeon: Quintin Buckle, DO;  Location: [facility] ENDOSCOPY;  Service: Endoscopy;  Laterality: N/A;   TUBAL LIGATION       Current Outpatient Medications  Medication Sig Dispense Refill   albuterol (VENTOLIN HFA) 108 (90 Base) MCG/ACT inhaler Inhale 2 puffs into the lungs every 6 (six) hours as needed.     amiodarone  (PACERONE ) 200 MG tablet TAKE 1 TABLET BY MOUTH TWICE A DAY 30 tablet 2   atorvastatin  (LIPITOR) 40 MG tablet TAKE 1 TABLET BY MOUTH DAILY 30 tablet 3   benztropine (COGENTIN) 0.5 MG tablet Take 0.5 mg by mouth daily.     cholecalciferol  (VITAMIN D) 400 UNITS TABS tablet Take 400 Units by mouth daily.      divalproex  (DEPAKOTE ) 250 MG DR tablet Take 3 tablets (750 mg total) by mouth 2 (two) times daily. 100  tablet 0   docusate sodium  (COLACE) 100 MG capsule TAKE ONE CAPSULE BY MOUTH TWICE A DAY 100 capsule 1   ELIQUIS  5 MG TABS tablet TAKE 1 TABLET BY MOUTH TWICE A DAY 60 tablet 2   fluticasone  (FLONASE ) 50 MCG/ACT nasal spray Place 2 sprays into the nose daily.     furosemide  (LASIX ) 20 MG tablet Take 1 tablet (20 mg total) by mouth daily as needed. 30 tablet 0   Melatonin 3 MG TABS Take 1 tablet (3 mg total) by mouth at bedtime. 30 tablet 1   metoprolol  succinate (TOPROL -XL) 50 MG 24 hr tablet TAKE 1 TABLET BY MOUTH DAILY 30 tablet 2   OLANZapine  (ZYPREXA ) 5 MG tablet Take by mouth.     pantoprazole  (PROTONIX ) 20 MG tablet Take 20 mg by mouth daily.     solifenacin  (VESICARE ) 5 MG tablet Take 1 tablet (5 mg total) by mouth daily. 90 tablet 3   spironolactone  (ALDACTONE ) 25 MG tablet Take 0.5 tablets (12.5 mg total) by mouth daily. 30 tablet 11   SYMBICORT  80-4.5 MCG/ACT inhaler Inhale into the lungs.     traZODone  (DESYREL ) 50 MG tablet      No current facility-administered medications for this visit.    Allergies:  Codeine, Dextroamphetamine, Nsaids, Morphine  and codeine, Penicillins, and Tolmetin    Social History:   reports that she has never smoked. She has never used smokeless tobacco. She reports that she does not drink alcohol and does not use drugs.   Family History:  family history includes Breast cancer (age of onset: 84) in her sister; Colon cancer in her maternal grandmother; Colon polyps in an other family member; Depression in an other family member; Diabetes in her maternal aunt and maternal uncle; Hypertension in her brother, mother, and sister.    ROS:     Review of Systems  Constitutional: Negative.   HENT: Negative.    Eyes: Negative.   Respiratory: Negative.    Gastrointestinal: Negative.   Genitourinary: Negative.   Musculoskeletal: Negative.   Skin: Negative.   Neurological: Negative.   Endo/Heme/Allergies: Negative.   Psychiatric/Behavioral: Negative.     All other systems reviewed and are negative.     All other systems are reviewed and negative.    PHYSICAL EXAM: VS:  BP 122/80   Pulse 70   Ht 5\' 2"  (1.575 m)   Wt 253 lb (114.8 kg)   SpO2 96%   BMI 46.27 kg/m  , BMI Body mass index is 46.27 kg/m. Last weight:  Wt Readings from Last 3 Encounters:  03/23/24 253 lb (114.8 kg)  12/10/23 264 lb (119.7 kg)  11/08/23 263 lb (119.3 kg)     Physical Exam Constitutional:      Appearance: Normal appearance.  Cardiovascular:     Rate and Rhythm: Normal rate and regular rhythm.     Heart sounds: Normal heart sounds.  Pulmonary:     Effort: Pulmonary effort is normal.     Breath sounds: Normal breath sounds.  Musculoskeletal:     Right lower leg: No edema.     Left lower leg: No edema.  Neurological:     Mental Status: She is alert.       EKG:   Recent Labs: 07/29/2023: ALT 18; BUN 21; Creatinine, Ser 1.74; Hemoglobin 13.2; Platelets 161; Potassium 4.8; Sodium 136    Lipid Panel    Component Value Date/Time   CHOL 108 06/25/2018 0442   CHOL 171 02/04/2015 0251   TRIG 27 06/25/2018 0442   TRIG 41 02/04/2015 0251   HDL 41 06/25/2018 0442   HDL 63 02/04/2015 0251   CHOLHDL 2.6 06/25/2018 0442   VLDL 5 06/25/2018 0442   VLDL 8 02/04/2015 0251   LDLCALC 62 06/25/2018 0442   LDLCALC 100 (H) 02/04/2015 0251      Other studies Reviewed: Additional studies/ records that were reviewed today include:  Review of the above records demonstrates:       No data to display            ASSESSMENT AND PLAN:    ICD-10-CM   1. SOB (shortness of breath)  R06.02    improving    2. Coronary artery disease involving native coronary artery of native heart without angina pectoris  I25.10    no chest pain    3. Cerebrovascular accident (CVA) due to occlusion of left posterior cerebral artery (HCC)  I63.532     4. Chronic atrial fibrillation (HCC)  I48.20     5. Primary hypertension  I10     6. CHF (congestive heart  failure), NYHA class III, acute on chronic, diastolic (HCC)  I50.33    has swelling of legs so advise regularly lasix  20 beside aldactone .  7. Nonrheumatic mitral valve regurgitation  I34.0        Problem List Items Addressed This Visit       Cardiovascular and Mediastinum   CAD (coronary artery disease)   HTN (hypertension)   Cerebrovascular accident (CVA) due to occlusion of left posterior cerebral artery (HCC)   Chronic atrial fibrillation (HCC)     Other   SOB (shortness of breath) - Primary   Other Visit Diagnoses       CHF (congestive heart failure), NYHA class III, acute on chronic, diastolic (HCC)       has swelling of legs so advise regularly lasix  20 beside aldactone .     Nonrheumatic mitral valve regurgitation              Disposition:   Return in about 2 months (around 05/23/2024).    Total time spent: 30 minutes  Signed,  Debborah Fairly, MD  03/23/2024 2:37 PM    [facility]

## 2024-03-24 ENCOUNTER — Other Ambulatory Visit: Payer: Self-pay

## 2024-03-24 DIAGNOSIS — R42 Dizziness and giddiness: Secondary | ICD-10-CM

## 2024-03-24 DIAGNOSIS — I2510 Atherosclerotic heart disease of native coronary artery without angina pectoris: Secondary | ICD-10-CM

## 2024-03-24 DIAGNOSIS — I10 Essential (primary) hypertension: Secondary | ICD-10-CM

## 2024-03-24 DIAGNOSIS — I482 Chronic atrial fibrillation, unspecified: Secondary | ICD-10-CM

## 2024-03-24 DIAGNOSIS — R0602 Shortness of breath: Secondary | ICD-10-CM

## 2024-03-24 DIAGNOSIS — I63429 Cerebral infarction due to embolism of unspecified anterior cerebral artery: Secondary | ICD-10-CM

## 2024-03-24 MED ORDER — AMIODARONE HCL 200 MG PO TABS
200.0000 mg | ORAL_TABLET | Freq: Two times a day (BID) | ORAL | 2 refills | Status: DC
Start: 2024-03-24 — End: 2024-07-20

## 2024-04-07 ENCOUNTER — Other Ambulatory Visit: Payer: Self-pay | Admitting: Cardiovascular Disease

## 2024-04-07 DIAGNOSIS — I2510 Atherosclerotic heart disease of native coronary artery without angina pectoris: Secondary | ICD-10-CM

## 2024-04-07 DIAGNOSIS — I10 Essential (primary) hypertension: Secondary | ICD-10-CM

## 2024-05-22 ENCOUNTER — Encounter

## 2024-05-25 ENCOUNTER — Ambulatory Visit (INDEPENDENT_AMBULATORY_CARE_PROVIDER_SITE_OTHER): Admitting: Cardiovascular Disease

## 2024-05-25 ENCOUNTER — Encounter: Payer: Self-pay | Admitting: Cardiovascular Disease

## 2024-05-25 VITALS — BP 122/78 | HR 76 | Ht 66.0 in | Wt 245.0 lb

## 2024-05-25 DIAGNOSIS — I5033 Acute on chronic diastolic (congestive) heart failure: Secondary | ICD-10-CM

## 2024-05-25 DIAGNOSIS — R0602 Shortness of breath: Secondary | ICD-10-CM

## 2024-05-25 DIAGNOSIS — I480 Paroxysmal atrial fibrillation: Secondary | ICD-10-CM

## 2024-05-25 DIAGNOSIS — I340 Nonrheumatic mitral (valve) insufficiency: Secondary | ICD-10-CM | POA: Diagnosis not present

## 2024-05-25 DIAGNOSIS — I2510 Atherosclerotic heart disease of native coronary artery without angina pectoris: Secondary | ICD-10-CM

## 2024-05-25 DIAGNOSIS — Z0130 Encounter for examination of blood pressure without abnormal findings: Secondary | ICD-10-CM

## 2024-05-25 DIAGNOSIS — I482 Chronic atrial fibrillation, unspecified: Secondary | ICD-10-CM

## 2024-05-25 DIAGNOSIS — I63532 Cerebral infarction due to unspecified occlusion or stenosis of left posterior cerebral artery: Secondary | ICD-10-CM

## 2024-05-25 NOTE — Progress Notes (Signed)
 Cardiology Office Note   Date:  05/25/2024   ID:  Carly Marks, DOB Oct 19, 1956, MRN [id number]  PCP:  Lorel Maxie DELENA, MD  Cardiologist:  Denyse Bathe, MD      History of Present Illness: Carly Marks is a 68 y.o. female who presents for No chief complaint on file.   Legs go out as legs are weak.      Past Medical History:  Diagnosis Date   Anemia    Anxiety    Arthritis    Asthma    CAD (coronary artery disease)    per patient   Depression    Family history of colonic polyps    GERD (gastroesophageal reflux disease)    HA (headache)    HTN (hypertension)    Major depressive disorder, recurrent episode, moderate (HCC)    Obstructive sleep apnea      Past Surgical History:  Procedure Laterality Date   CARDIAC CATHETERIZATION  02/19/15, 06/2009, 08/2011   COLONOSCOPY N/A 04/22/2015   Procedure: COLONOSCOPY;  Surgeon: Deward CINDERELLA Piedmont, MD;  Location: [facility] ENDOSCOPY;  Service: Gastroenterology;  Laterality: N/A;   COLONOSCOPY WITH PROPOFOL  N/A 09/26/2021   Procedure: COLONOSCOPY WITH PROPOFOL ;  Surgeon: Onita Elspeth Sharper, DO;  Location: [facility] ENDOSCOPY;  Service: Endoscopy;  Laterality: N/A;   TUBAL LIGATION       Current Outpatient Medications  Medication Sig Dispense Refill   albuterol (VENTOLIN HFA) 108 (90 Base) MCG/ACT inhaler Inhale 2 puffs into the lungs every 6 (six) hours as needed.     amiodarone  (PACERONE ) 200 MG tablet Take 1 tablet (200 mg total) by mouth 2 (two) times daily. 60 tablet 2   atorvastatin  (LIPITOR) 40 MG tablet TAKE 1 TABLET BY MOUTH DAILY 30 tablet 3   benztropine (COGENTIN) 0.5 MG tablet Take 0.5 mg by mouth daily.     cholecalciferol  (VITAMIN D) 400 UNITS TABS tablet Take 400 Units by mouth daily.      divalproex  (DEPAKOTE ) 250 MG DR tablet Take 3 tablets (750 mg total) by mouth 2 (two) times daily. 100 tablet 0   docusate sodium  (COLACE) 100 MG capsule TAKE ONE CAPSULE BY MOUTH TWICE A DAY 100 capsule 1   ELIQUIS  5 MG TABS  tablet TAKE 1 TABLET BY MOUTH TWICE A DAY 60 tablet 2   fluticasone  (FLONASE ) 50 MCG/ACT nasal spray Place 2 sprays into the nose daily.     furosemide  (LASIX ) 20 MG tablet Take 1 tablet (20 mg total) by mouth daily as needed. 30 tablet 0   Melatonin 3 MG TABS Take 1 tablet (3 mg total) by mouth at bedtime. 30 tablet 1   metoprolol  succinate (TOPROL -XL) 50 MG 24 hr tablet TAKE 1 TABLET BY MOUTH DAILY 30 tablet 2   OLANZapine  (ZYPREXA ) 5 MG tablet Take by mouth.     pantoprazole  (PROTONIX ) 20 MG tablet Take 20 mg by mouth daily.     solifenacin  (VESICARE ) 5 MG tablet Take 1 tablet (5 mg total) by mouth daily. 90 tablet 3   spironolactone  (ALDACTONE ) 25 MG tablet Take 0.5 tablets (12.5 mg total) by mouth daily. 30 tablet 11   SYMBICORT  80-4.5 MCG/ACT inhaler Inhale into the lungs.     traZODone  (DESYREL ) 50 MG tablet      No current facility-administered medications for this visit.    Allergies:   Codeine, Dextroamphetamine, Nsaids, Morphine  and codeine, Penicillins, and Tolmetin    Social History:   reports that she has never smoked. She has  never used smokeless tobacco. She reports that she does not drink alcohol and does not use drugs.   Family History:  family history includes Breast cancer (age of onset: 61) in her sister; Colon cancer in her maternal grandmother; Colon polyps in an other family member; Depression in an other family member; Diabetes in her maternal aunt and maternal uncle; Hypertension in her brother, mother, and sister.    ROS:     Review of Systems  Constitutional: Negative.   HENT: Negative.    Eyes: Negative.   Respiratory: Negative.    Gastrointestinal: Negative.   Genitourinary: Negative.   Musculoskeletal: Negative.   Skin: Negative.   Neurological: Negative.   Endo/Heme/Allergies: Negative.   Psychiatric/Behavioral: Negative.    All other systems reviewed and are negative.     All other systems are reviewed and negative.    PHYSICAL EXAM: VS:   BP 122/78   Pulse 76   Ht 5' 6 (1.676 m)   Wt 245 lb (111.1 kg)   SpO2 99%   BMI 39.54 kg/m  , BMI Body mass index is 39.54 kg/m. Last weight:  Wt Readings from Last 3 Encounters:  05/25/24 245 lb (111.1 kg)  03/23/24 253 lb (114.8 kg)  12/10/23 264 lb (119.7 kg)     Physical Exam Constitutional:      Appearance: Normal appearance.  Cardiovascular:     Rate and Rhythm: Normal rate and regular rhythm.     Heart sounds: Normal heart sounds.  Pulmonary:     Effort: Pulmonary effort is normal.     Breath sounds: Normal breath sounds.  Musculoskeletal:     Right lower leg: No edema.     Left lower leg: No edema.  Neurological:     Mental Status: She is alert.       EKG:   Recent Labs: 07/29/2023: ALT 18; BUN 21; Creatinine, Ser 1.74; Hemoglobin 13.2; Platelets 161; Potassium 4.8; Sodium 136    Lipid Panel    Component Value Date/Time   CHOL 108 06/25/2018 0442   CHOL 171 02/04/2015 0251   TRIG 27 06/25/2018 0442   TRIG 41 02/04/2015 0251   HDL 41 06/25/2018 0442   HDL 63 02/04/2015 0251   CHOLHDL 2.6 06/25/2018 0442   VLDL 5 06/25/2018 0442   VLDL 8 02/04/2015 0251   LDLCALC 62 06/25/2018 0442   LDLCALC 100 (H) 02/04/2015 0251      Other studies Reviewed: Additional studies/ records that were reviewed today include:  Review of the above records demonstrates:       No data to display            ASSESSMENT AND PLAN:    ICD-10-CM   1. Paroxysmal atrial fibrillation (HCC)  I48.0 PCV ECHOCARDIOGRAM COMPLETE   In NSR on po amiodrone    2. SOB (shortness of breath)  R06.02 PCV ECHOCARDIOGRAM COMPLETE   SOB getting better.    3. Nonrheumatic mitral valve regurgitation  I34.0 PCV ECHOCARDIOGRAM COMPLETE    4. CHF (congestive heart failure), NYHA class III, acute on chronic, diastolic (HCC)  I50.33 PCV ECHOCARDIOGRAM COMPLETE    5. Chronic atrial fibrillation (HCC)  I48.20 PCV ECHOCARDIOGRAM COMPLETE    6. Coronary artery disease involving native  coronary artery of native heart without angina pectoris  I25.10 PCV ECHOCARDIOGRAM COMPLETE    7. Cerebrovascular accident (CVA) due to occlusion of left posterior cerebral artery (HCC)  I63.532 PCV ECHOCARDIOGRAM COMPLETE       Problem List Items  Addressed This Visit       Cardiovascular and Mediastinum   CAD (coronary artery disease)   Relevant Orders   PCV ECHOCARDIOGRAM COMPLETE   Cerebrovascular accident (CVA) due to occlusion of left posterior cerebral artery (HCC)   Relevant Orders   PCV ECHOCARDIOGRAM COMPLETE   Chronic atrial fibrillation (HCC)   Relevant Orders   PCV ECHOCARDIOGRAM COMPLETE     Other   SOB (shortness of breath)   Relevant Orders   PCV ECHOCARDIOGRAM COMPLETE   Other Visit Diagnoses       Paroxysmal atrial fibrillation (HCC)    -  Primary   In NSR on po amiodrone   Relevant Orders   PCV ECHOCARDIOGRAM COMPLETE     Nonrheumatic mitral valve regurgitation       Relevant Orders   PCV ECHOCARDIOGRAM COMPLETE     CHF (congestive heart failure), NYHA class III, acute on chronic, diastolic (HCC)       Relevant Orders   PCV ECHOCARDIOGRAM COMPLETE          Disposition:   Return in about 2 months (around 07/26/2024) for echo and f/u.    Total time spent: 30 minutes  Signed,  Denyse Bathe, MD  05/25/2024 2:31 PM    [facility]

## 2024-06-03 ENCOUNTER — Other Ambulatory Visit: Payer: Self-pay | Admitting: Cardiovascular Disease

## 2024-06-03 DIAGNOSIS — I639 Cerebral infarction, unspecified: Secondary | ICD-10-CM

## 2024-06-12 ENCOUNTER — Ambulatory Visit: Payer: Self-pay | Admitting: Urology

## 2024-06-15 ENCOUNTER — Encounter: Payer: Self-pay | Admitting: Urology

## 2024-07-03 ENCOUNTER — Other Ambulatory Visit: Payer: Self-pay | Admitting: Cardiovascular Disease

## 2024-07-03 DIAGNOSIS — I2510 Atherosclerotic heart disease of native coronary artery without angina pectoris: Secondary | ICD-10-CM

## 2024-07-03 DIAGNOSIS — I10 Essential (primary) hypertension: Secondary | ICD-10-CM

## 2024-07-07 ENCOUNTER — Ambulatory Visit

## 2024-07-07 DIAGNOSIS — I371 Nonrheumatic pulmonary valve insufficiency: Secondary | ICD-10-CM

## 2024-07-07 DIAGNOSIS — I63532 Cerebral infarction due to unspecified occlusion or stenosis of left posterior cerebral artery: Secondary | ICD-10-CM

## 2024-07-07 DIAGNOSIS — R0602 Shortness of breath: Secondary | ICD-10-CM

## 2024-07-07 DIAGNOSIS — I361 Nonrheumatic tricuspid (valve) insufficiency: Secondary | ICD-10-CM

## 2024-07-07 DIAGNOSIS — I480 Paroxysmal atrial fibrillation: Secondary | ICD-10-CM

## 2024-07-07 DIAGNOSIS — I340 Nonrheumatic mitral (valve) insufficiency: Secondary | ICD-10-CM

## 2024-07-07 DIAGNOSIS — I4891 Unspecified atrial fibrillation: Secondary | ICD-10-CM

## 2024-07-07 DIAGNOSIS — I482 Chronic atrial fibrillation, unspecified: Secondary | ICD-10-CM

## 2024-07-07 DIAGNOSIS — I5033 Acute on chronic diastolic (congestive) heart failure: Secondary | ICD-10-CM

## 2024-07-07 DIAGNOSIS — I2510 Atherosclerotic heart disease of native coronary artery without angina pectoris: Secondary | ICD-10-CM

## 2024-07-20 ENCOUNTER — Encounter: Payer: Self-pay | Admitting: Cardiovascular Disease

## 2024-07-20 ENCOUNTER — Ambulatory Visit (INDEPENDENT_AMBULATORY_CARE_PROVIDER_SITE_OTHER): Admitting: Cardiovascular Disease

## 2024-07-20 ENCOUNTER — Ambulatory Visit
Admission: RE | Admit: 2024-07-20 | Discharge: 2024-07-20 | Disposition: A | Discharge status: Home / Self Care | Source: Ambulatory Visit | Attending: Family Medicine | Admitting: Family Medicine

## 2024-07-20 DIAGNOSIS — I482 Chronic atrial fibrillation, unspecified: Secondary | ICD-10-CM | POA: Diagnosis not present

## 2024-07-20 DIAGNOSIS — R0602 Shortness of breath: Secondary | ICD-10-CM

## 2024-07-20 DIAGNOSIS — Z1231 Encounter for screening mammogram for malignant neoplasm of breast: Secondary | ICD-10-CM | POA: Diagnosis present

## 2024-07-20 DIAGNOSIS — R42 Dizziness and giddiness: Secondary | ICD-10-CM | POA: Diagnosis not present

## 2024-07-20 DIAGNOSIS — I10 Essential (primary) hypertension: Secondary | ICD-10-CM

## 2024-07-20 DIAGNOSIS — I63429 Cerebral infarction due to embolism of unspecified anterior cerebral artery: Secondary | ICD-10-CM

## 2024-07-20 DIAGNOSIS — I2510 Atherosclerotic heart disease of native coronary artery without angina pectoris: Secondary | ICD-10-CM

## 2024-07-20 MED ORDER — AMIODARONE HCL 200 MG PO TABS
200.0000 mg | ORAL_TABLET | Freq: Every day | ORAL | 2 refills | Status: DC
Start: 2024-07-20 — End: 2024-09-01

## 2024-07-20 NOTE — Progress Notes (Signed)
 Cardiology Office Note   Date:  07/20/2024   ID:  Carly Marks, DOB Apr 27, 1956, MRN [id number]  PCP:  Lorel Maxie DELENA, MD  Cardiologist:  Denyse Bathe, MD      History of Present Illness: Carly Marks is a 68 y.o. female who presents for  Chief Complaint  Patient presents with   Follow-up    Echo, 2 month follow up    Feeling better. But on amiodrone 200 bid, and has tremors. Will change to daily.      Past Medical History:  Diagnosis Date   Anemia    Anxiety    Arthritis    Asthma    CAD (coronary artery disease)    per patient   Depression    Family history of colonic polyps    GERD (gastroesophageal reflux disease)    HA (headache)    HTN (hypertension)    Major depressive disorder, recurrent episode, moderate (HCC)    Obstructive sleep apnea      Past Surgical History:  Procedure Laterality Date   CARDIAC CATHETERIZATION  02/19/15, 06/2009, 08/2011   COLONOSCOPY N/A 04/22/2015   Procedure: COLONOSCOPY;  Surgeon: Deward CINDERELLA Piedmont, MD;  Location: [facility] ENDOSCOPY;  Service: Gastroenterology;  Laterality: N/A;   COLONOSCOPY WITH PROPOFOL  N/A 09/26/2021   Procedure: COLONOSCOPY WITH PROPOFOL ;  Surgeon: Onita Elspeth Sharper, DO;  Location: [facility] ENDOSCOPY;  Service: Endoscopy;  Laterality: N/A;   TUBAL LIGATION       Current Outpatient Medications  Medication Sig Dispense Refill   albuterol (VENTOLIN HFA) 108 (90 Base) MCG/ACT inhaler Inhale 2 puffs into the lungs every 6 (six) hours as needed.     atorvastatin  (LIPITOR) 40 MG tablet TAKE 1 TABLET BY MOUTH DAILY 30 tablet 3   benztropine (COGENTIN) 0.5 MG tablet Take 0.5 mg by mouth daily.     cholecalciferol  (VITAMIN D) 400 UNITS TABS tablet Take 400 Units by mouth daily.      divalproex  (DEPAKOTE ) 250 MG DR tablet Take 3 tablets (750 mg total) by mouth 2 (two) times daily. 100 tablet 0   docusate sodium  (COLACE) 100 MG capsule TAKE ONE CAPSULE BY MOUTH TWICE A DAY 100 capsule 1   ELIQUIS  5 MG TABS tablet  TAKE 1 TABLET BY MOUTH TWICE A DAY 60 tablet 2   fluticasone  (FLONASE ) 50 MCG/ACT nasal spray Place 2 sprays into the nose daily. (Patient taking differently: Place 2 sprays into the nose as needed for allergies.)     furosemide  (LASIX ) 20 MG tablet Take 1 tablet (20 mg total) by mouth daily as needed. 30 tablet 0   metoprolol  succinate (TOPROL -XL) 50 MG 24 hr tablet TAKE 1 TABLET BY MOUTH DAILY 30 tablet 2   OLANZapine  (ZYPREXA ) 5 MG tablet Take by mouth.     pantoprazole  (PROTONIX ) 20 MG tablet Take 20 mg by mouth daily.     solifenacin  (VESICARE ) 5 MG tablet Take 1 tablet (5 mg total) by mouth daily. 90 tablet 3   spironolactone  (ALDACTONE ) 25 MG tablet Take 0.5 tablets (12.5 mg total) by mouth daily. 30 tablet 11   SYMBICORT  80-4.5 MCG/ACT inhaler Inhale into the lungs. (Patient taking differently: Inhale 2 puffs into the lungs as needed.)     traZODone  (DESYREL ) 50 MG tablet      amiodarone  (PACERONE ) 200 MG tablet Take 1 tablet (200 mg total) by mouth daily. 60 tablet 2   No current facility-administered medications for this visit.    Allergies:   Codeine,  Dextroamphetamine, Nsaids, Morphine  and codeine, Penicillins, and Tolmetin    Social History:   reports that she has never smoked. She has never used smokeless tobacco. She reports that she does not drink alcohol and does not use drugs.   Family History:  family history includes Breast cancer (age of onset: 57) in her sister; Colon cancer in her maternal grandmother; Colon polyps in an other family member; Depression in an other family member; Diabetes in her maternal aunt and maternal uncle; Hypertension in her brother, mother, and sister.    ROS:     Review of Systems  Constitutional: Negative.   HENT: Negative.    Eyes: Negative.   Respiratory: Negative.    Gastrointestinal: Negative.   Genitourinary: Negative.   Musculoskeletal: Negative.   Skin: Negative.   Neurological: Negative.   Endo/Heme/Allergies: Negative.    Psychiatric/Behavioral: Negative.    All other systems reviewed and are negative.     All other systems are reviewed and negative.    PHYSICAL EXAM: VS:  BP 118/76   Pulse 62   Ht 5' 6 (1.676 m)   SpO2 99%   BMI 39.54 kg/m  , BMI Body mass index is 39.54 kg/m. Last weight:  Wt Readings from Last 3 Encounters:  05/25/24 245 lb (111.1 kg)  03/23/24 253 lb (114.8 kg)  12/10/23 264 lb (119.7 kg)     Physical Exam Constitutional:      Appearance: Normal appearance.  Cardiovascular:     Rate and Rhythm: Normal rate and regular rhythm.     Heart sounds: Normal heart sounds.  Pulmonary:     Effort: Pulmonary effort is normal.     Breath sounds: Normal breath sounds.  Musculoskeletal:     Right lower leg: No edema.     Left lower leg: No edema.  Neurological:     Mental Status: She is alert.       EKG:   Recent Labs: 07/29/2023: ALT 18; BUN 21; Creatinine, Ser 1.74; Hemoglobin 13.2; Platelets 161; Potassium 4.8; Sodium 136    Lipid Panel    Component Value Date/Time   CHOL 108 06/25/2018 0442   CHOL 171 02/04/2015 0251   TRIG 27 06/25/2018 0442   TRIG 41 02/04/2015 0251   HDL 41 06/25/2018 0442   HDL 63 02/04/2015 0251   CHOLHDL 2.6 06/25/2018 0442   VLDL 5 06/25/2018 0442   VLDL 8 02/04/2015 0251   LDLCALC 62 06/25/2018 0442   LDLCALC 100 (H) 02/04/2015 0251      Other studies Reviewed: Additional studies/ records that were reviewed today include:  Review of the above records demonstrates:       No data to display            ASSESSMENT AND PLAN:    ICD-10-CM   1. Dizziness  R42 amiodarone  (PACERONE ) 200 MG tablet   getting worse, at time BP is low    2. Primary hypertension  I10 amiodarone  (PACERONE ) 200 MG tablet    3. SOB (shortness of breath)  R06.02 amiodarone  (PACERONE ) 200 MG tablet   no SOB    4. Chronic atrial fibrillation (HCC)  I48.20 amiodarone  (PACERONE ) 200 MG tablet   Try amiodrone 200 daily, as has tremors.    5.  Coronary artery disease involving native coronary artery of native heart without angina pectoris  I25.10 amiodarone  (PACERONE ) 200 MG tablet    6. Cerebrovascular accident (CVA) due to embolism of anterior cerebral artery, unspecified blood vessel laterality (HCC)  P36.570 amiodarone  (PACERONE ) 200 MG tablet   No chest pains.       Problem List Items Addressed This Visit       Cardiovascular and Mediastinum   CAD (coronary artery disease)   Relevant Medications   amiodarone  (PACERONE ) 200 MG tablet   HTN (hypertension)   Relevant Medications   amiodarone  (PACERONE ) 200 MG tablet   CVA (cerebral vascular accident) (HCC)   Relevant Medications   amiodarone  (PACERONE ) 200 MG tablet   Chronic atrial fibrillation (HCC)   Relevant Medications   amiodarone  (PACERONE ) 200 MG tablet     Other   Dizziness   Relevant Medications   amiodarone  (PACERONE ) 200 MG tablet   SOB (shortness of breath)   Relevant Medications   amiodarone  (PACERONE ) 200 MG tablet       Disposition:   Return in about 3 months (around 10/19/2024).    Total time spent: 30 minutes  Signed,  Denyse Bathe, MD  07/20/2024 11:48 AM    [facility]

## 2024-08-11 ENCOUNTER — Emergency Department
Admission: EM | Admit: 2024-08-11 | Discharge: 2024-08-11 | Disposition: A | Discharge status: Home / Self Care | Attending: Emergency Medicine | Admitting: Emergency Medicine

## 2024-08-11 ENCOUNTER — Emergency Department

## 2024-08-11 ENCOUNTER — Other Ambulatory Visit: Payer: Self-pay

## 2024-08-11 DIAGNOSIS — I10 Essential (primary) hypertension: Secondary | ICD-10-CM | POA: Insufficient documentation

## 2024-08-11 DIAGNOSIS — I2510 Atherosclerotic heart disease of native coronary artery without angina pectoris: Secondary | ICD-10-CM | POA: Diagnosis not present

## 2024-08-11 DIAGNOSIS — Z7901 Long term (current) use of anticoagulants: Secondary | ICD-10-CM | POA: Diagnosis not present

## 2024-08-11 DIAGNOSIS — R1031 Right lower quadrant pain: Secondary | ICD-10-CM | POA: Diagnosis present

## 2024-08-11 DIAGNOSIS — I482 Chronic atrial fibrillation, unspecified: Secondary | ICD-10-CM | POA: Diagnosis not present

## 2024-08-11 DIAGNOSIS — R197 Diarrhea, unspecified: Secondary | ICD-10-CM | POA: Diagnosis not present

## 2024-08-11 LAB — URINALYSIS, ROUTINE W REFLEX MICROSCOPIC
Bacteria, UA: NONE SEEN
Bilirubin Urine: NEGATIVE
Glucose, UA: NEGATIVE mg/dL
Hgb urine dipstick: NEGATIVE
Ketones, ur: 5 mg/dL — AB
Nitrite: NEGATIVE
Protein, ur: 30 mg/dL — AB
Specific Gravity, Urine: 1.031 — ABNORMAL HIGH (ref 1.005–1.030)
WBC, UA: >50 WBC/hpf (ref 0–5)
pH: 5 (ref 5.0–8.0)

## 2024-08-11 LAB — COMPREHENSIVE METABOLIC PANEL WITH GFR
ALT: 16 U/L (ref 0–44)
AST: 25 U/L (ref 15–41)
Albumin: 3 g/dL — ABNORMAL LOW (ref 3.5–5.0)
Alkaline Phosphatase: 42 U/L (ref 38–126)
Anion gap: 7 (ref 5–15)
BUN: 12 mg/dL (ref 8–23)
CO2: 27 mmol/L (ref 22–32)
Calcium: 8.5 mg/dL — ABNORMAL LOW (ref 8.9–10.3)
Chloride: 108 mmol/L (ref 98–111)
Creatinine, Ser: 1.29 mg/dL — ABNORMAL HIGH (ref 0.44–1.00)
GFR, Estimated: 45 mL/min — ABNORMAL LOW (ref >60)
Glucose, Bld: 75 mg/dL (ref 70–99)
Potassium: 4.2 mmol/L (ref 3.5–5.1)
Sodium: 142 mmol/L (ref 135–145)
Total Bilirubin: 0.7 mg/dL (ref 0.0–1.2)
Total Protein: 5.9 g/dL — ABNORMAL LOW (ref 6.5–8.1)

## 2024-08-11 LAB — CBC
HCT: 34.4 % — ABNORMAL LOW (ref 36.0–46.0)
Hemoglobin: 11.5 g/dL — ABNORMAL LOW (ref 12.0–15.0)
MCH: 25.2 pg — ABNORMAL LOW (ref 26.0–34.0)
MCHC: 33.4 g/dL (ref 30.0–36.0)
MCV: 75.3 fL — ABNORMAL LOW (ref 80.0–100.0)
Platelets: 146 K/uL — ABNORMAL LOW (ref 150–400)
RBC: 4.57 MIL/uL (ref 3.87–5.11)
RDW: 16 % — ABNORMAL HIGH (ref 11.5–15.5)
WBC: 7.6 K/uL (ref 4.0–10.5)
nRBC: 0 % (ref 0.0–0.2)

## 2024-08-11 LAB — LIPASE, BLOOD: Lipase: 46 U/L (ref 11–51)

## 2024-08-11 MED ORDER — MORPHINE SULFATE (PF) 4 MG/ML IV SOLN: 4.0000 mg | Freq: Once | INTRAVENOUS | Status: DC

## 2024-08-11 MED ORDER — LACTATED RINGERS IV BOLUS
1000.0000 mL | Freq: Once | INTRAVENOUS | Status: AC
  Administered 2024-08-11: 1000 mL via INTRAVENOUS

## 2024-08-11 MED ORDER — IOHEXOL 300 MG/ML  SOLN
80.0000 mL | Freq: Once | INTRAMUSCULAR | Status: AC | PRN
  Administered 2024-08-11: 80 mL via INTRAVENOUS

## 2024-08-11 MED ORDER — ACETAMINOPHEN 500 MG PO TABS
1000.0000 mg | ORAL_TABLET | Freq: Once | ORAL | Status: AC
  Administered 2024-08-11: 1000 mg via ORAL
  Filled 2024-08-11: qty 2

## 2024-08-11 MED ORDER — ONDANSETRON HCL 4 MG/2ML IJ SOLN
4.0000 mg | Freq: Once | INTRAMUSCULAR | Status: AC
  Administered 2024-08-11: 4 mg via INTRAVENOUS
  Filled 2024-08-11: qty 2

## 2024-08-11 NOTE — ED Notes (Signed)
 Fall risk bundle in place, patient clean and comfortable, assisted by family to the restroom and urine collected for the lab, remote found for the tv and no other needs at this time

## 2024-08-11 NOTE — ED Triage Notes (Signed)
 Pt to ed from [facility] for abd pain that started Monday. She went to [facility] today and they brought her over here. Pt is caox4, in no acute distress and in her own wheel chair in triage.

## 2024-08-11 NOTE — ED Provider Notes (Signed)
 [facility] Provider Note    Event Date/Time   First MD Initiated Contact with Patient 08/11/24 1633     (approximate)   History   Abdominal Pain   HPI  Carly Marks is a 68 y.o. female who presents to the ED for evaluation of Abdominal Pain   Review a cardiology clinic visit from 9/11.  Obese patient with history of CAD, HTN, OSA.  A-fib on Eliquis  and amiodarone .  Only documented abdominal surgical history of tubal ligation.  Patient presents to the ED alongside her daughter for evaluation of multiple days of diarrhea and developing RLQ pain today.  Daughter reports that the whole family had diarrhea after eating together on Sunday but patient's diarrhea did not stop this week despite trying Imodium.  Developed RLQ pain today.  No fevers, emesis or urinary changes reported.   Physical Exam   Triage Vital Signs: ED Triage Vitals  Encounter Vitals Group     BP 08/11/24 1625 118/82     Girls Systolic BP Percentile --      Girls Diastolic BP Percentile --      Boys Systolic BP Percentile --      Boys Diastolic BP Percentile --      Pulse Rate 08/11/24 1625 100     Resp 08/11/24 1625 16     Temp 08/11/24 1625 98.3 F (36.8 C)     Temp Source 08/11/24 1625 Oral     SpO2 08/11/24 1625 98 %     Weight --      Height 08/11/24 1627 5' 6 (1.676 m)     Head Circumference --      Peak Flow --      Pain Score 08/11/24 1626 7     Pain Loc --      Pain Education --      Exclude from Growth Chart --     Most recent vital signs: Vitals:   08/11/24 1625 08/11/24 2038  BP: 118/82 121/72  Pulse: 100 77  Resp: 16 16  Temp: 98.3 F (36.8 C) 97.6 F (36.4 C)  SpO2: 98% 100%    General: Awake, no distress.  CV:  Good peripheral perfusion.  Resp:  Normal effort.  Abd:  No distention.  Mild localized tenderness without guarding or peritoneal features. MSK:  No deformity noted.  Neuro:  No focal deficits appreciated. Other:     ED Results /  Procedures / Treatments   Labs (all labs ordered are listed, but only abnormal results are displayed) Labs Reviewed  COMPREHENSIVE METABOLIC PANEL WITH GFR - Abnormal; Notable for the following components:      Result Value   Creatinine, Ser 1.29 (*)    Calcium  8.5 (*)    Total Protein 5.9 (*)    Albumin 3.0 (*)    GFR, Estimated 45 (*)    All other components within normal limits  CBC - Abnormal; Notable for the following components:   Hemoglobin 11.5 (*)    HCT 34.4 (*)    MCV 75.3 (*)    MCH 25.2 (*)    RDW 16.0 (*)    Platelets 146 (*)    All other components within normal limits  URINALYSIS, ROUTINE W REFLEX MICROSCOPIC - Abnormal; Notable for the following components:   Color, Urine YELLOW (*)    APPearance CLOUDY (*)    Specific Gravity, Urine 1.031 (*)    Ketones, ur 5 (*)    Protein, ur 30 (*)  Leukocytes,Ua LARGE (*)    All other components within normal limits  URINE CULTURE  LIPASE, BLOOD    EKG   RADIOLOGY CT abdomen/pelvis with mild colonic thickening to the ascending colon  Official radiology report(s): CT ABDOMEN PELVIS W CONTRAST Result Date: 08/11/2024 EXAM: CT ABDOMEN AND PELVIS WITH CONTRAST 08/11/2024 07:21:15 PM TECHNIQUE: CT of the abdomen and pelvis was performed with the administration of 80 mL of intravenous iohexol (OMNIPAQUE) 300 MG/ML solution. Multiplanar reformatted images are provided for review. Automated exposure control, iterative reconstruction, and/or weight-based adjustment of the mA/kV was utilized to reduce the radiation dose to as low as reasonably achievable. COMPARISON: 01/01/2019 CLINICAL HISTORY: Abdominal pain. FINDINGS: LOWER CHEST: No acute abnormality. LIVER: A 2.6 cm simple cyst is seen within the left lobe of the liver. GALLBLADDER AND BILE DUCTS: Gallbladder is unremarkable. No biliary ductal dilatation. SPLEEN: No acute abnormality. PANCREAS: No acute abnormality. ADRENAL GLANDS: No acute abnormality. KIDNEYS, URETERS AND  BLADDER: Areas of cortical scarring are seen along the posterior and posterior lateral aspect of the mid and lower left kidney. A 1.7 cm diameter simple cyst is seen within the lateral aspect of the mid right kidney. A 0.8 cm diameter simple cyst is noted within the lower pole of the right kidney. Per consensus, no follow-up is needed for simple Bosniak type 1 and 2 renal cysts, unless the patient has a malignancy history or risk factors. No stones in the kidneys or ureters. No hydronephrosis. No perinephric or periureteral stranding. Urinary bladder is unremarkable. GI AND BOWEL: Mildly thickened, poorly distended ascending colon is seen. Stomach demonstrates no acute abnormality. There is no bowel obstruction. PERITONEUM AND RETROPERITONEUM: No ascites. No free air. VASCULATURE: Aorta is normal in caliber. LYMPH NODES: No lymphadenopathy. REPRODUCTIVE ORGANS: A 3.0 cm diameter simple right adnexal cyst is seen. Partially calcified uterine fibroids are present. BONES AND SOFT TISSUES: No acute osseous abnormality. No focal soft tissue abnormality. IMPRESSION: 1. Mild thickening of the ascending colon, which may be related to underdistention. 2. Simple hepatic and renal cysts. 3.  Simple right adnexal cyst. No follow-up imaging is recommended. Electronically signed by: Suzen Dials MD 08/11/2024 07:47 PM EDT RP Workstation: [id number]    PROCEDURES and INTERVENTIONS:  Procedures  Medications  ondansetron [facility]) injection 4 mg (4 mg Intravenous Given 08/11/24 1812)  lactated ringers bolus 1,000 mL (1,000 mLs Intravenous New Bag/Given 08/11/24 1812)  acetaminophen  (TYLENOL ) tablet 1,000 mg (1,000 mg Oral Given 08/11/24 1811)  iohexol (OMNIPAQUE) 300 MG/ML solution 80 mL (80 mLs Intravenous Contrast Given 08/11/24 1908)     IMPRESSION / MDM / ASSESSMENT AND PLAN / ED COURSE  I reviewed the triage vital signs and the nursing notes.  Differential diagnosis includes, but is not limited to, colitis,  diverticulitis, acute appendicitis, UTI, AKI or dehydration  {Patient presents with symptoms of an acute illness or injury that is potentially life-threatening.  Patient presents with diarrhea and RLQ pain.  Possible colitis and suitable for outpatient management.  Reassuring vital signs.  Mild localized tenderness on exam.  Blood work with renal dysfunction at baseline, no leukocytosis or signs of sepsis.  She has no urinary symptoms and her urine is sent for a culture.  Doubt cystitis considering her lack of symptoms and bacteria on the UA.  Clinical Course as of 08/11/24 2110  Fri Aug 11, 2024  2108 Reassessed.  Patient reports feeling much better after the IV fluids and Tylenol .  We discussed reassuring CT with some mild ascending colonic  thickening that could represent some mild colitis in the setting of her diarrhea.  She reaffirms no urinary symptoms such as dysuria, hematuria, frequency or other changes.  We discussed UA with leukocytes but no bacteria, sent for culture and holding off on antibiotics at this point. [DS]  2108 We discussed expectant management, care at home and ED return precautions. [DS]    Clinical Course User Index [DS] Claudene Rover, MD     FINAL CLINICAL IMPRESSION(S) / ED DIAGNOSES   Final diagnoses:  Diarrhea, unspecified type  Right lower quadrant abdominal pain     Rx / DC Orders   ED Discharge Orders     None        Note:  This document was prepared using Dragon voice recognition software and may include unintentional dictation errors.   Claudene Rover, MD 08/11/24 2112

## 2024-08-11 NOTE — ED Notes (Signed)
 IV US  team at the bedside.

## 2024-08-11 NOTE — Discharge Instructions (Addendum)
 Use Tylenol  for pain and fevers.  Up to 1000 mg per dose, up to 4 times per day.  Do not take more than 4000 mg of Tylenol /acetaminophen  within 24 hours..  Use Imodium as needed for any further diarrhea  Return to the ED with any worsening symptoms despite these measures  As we discussed, you will get a phone call if the urine culture returns with signs of infection

## 2024-08-11 NOTE — ED Notes (Signed)
Called lab to obtain blood

## 2024-08-14 LAB — URINE CULTURE: Culture: >=100000 — AB

## 2024-08-25 ENCOUNTER — Other Ambulatory Visit: Payer: Self-pay | Admitting: Cardiovascular Disease

## 2024-08-25 DIAGNOSIS — I639 Cerebral infarction, unspecified: Secondary | ICD-10-CM

## 2024-08-29 ENCOUNTER — Ambulatory Visit: Admitting: Physician Assistant

## 2024-08-30 NOTE — Progress Notes (Unsigned)
 08/31/2024 4:43 PM   Carly Marks [birth year] [id number]  Referring provider: Lorel Maxie DELENA, MD [address] [place],  [place] [address]  Urological history: 1. Urge incontinence - Vesicare  5 mg daily  2. SUI  Chief Complaint  Patient presents with   Medication Refill   HPI: Carly Marks is a 68 y.o. woman who presents today for medication refill with her son, Carly Marks, and her daughter, Carly Marks, on the phone.    Previous records reviewed  History is given by son and daughter.  She is having 1-7 daytime voids, 1-2 episodes of nocturia with a mild urge to urinate.  She does have urinary leakage leaking is 1-2 times a week.  She wears pads daily.  She limits fluid intake.  She does engage in toilet mapping.  Patient denies any modifying or aggravating factors.  Patient denies any recent UTI's, gross hematuria, dysuria or suprapubic/flank pain.  Patient denies any fevers, chills, nausea or vomiting.    CATH UA yellow clear, Sensoready 1.015, pH 6.0, 0-5 WBCs, 0-2 RBCs, 0-2 epithelial cells and hyaline cast present.  PVR 100 cc  Serum creatinine 1.47, eGFR 39  OAB agent Vesicare  5 mg daily  Diuretics furosemide   She is at goal with Vesicare  5 mg daily.  She does have some constipation, but is managed with Senokot.   PMH: Past Medical History:  Diagnosis Date   Anemia    Anxiety    Arthritis    Asthma    CAD (coronary artery disease)    per patient   Depression    Family history of colonic polyps    GERD (gastroesophageal reflux disease)    HA (headache)    HTN (hypertension)    Major depressive disorder, recurrent episode, moderate (HCC)    Obstructive sleep apnea     Surgical History: Past Surgical History:  Procedure Laterality Date   CARDIAC CATHETERIZATION  02/19/15, 06/2009, 08/2011   COLONOSCOPY N/A 04/22/2015   Procedure: COLONOSCOPY;  Surgeon: Deward CINDERELLA Piedmont, MD;  Location: [facility] ENDOSCOPY;  Service: Gastroenterology;  Laterality: N/A;    COLONOSCOPY WITH PROPOFOL  N/A 09/26/2021   Procedure: COLONOSCOPY WITH PROPOFOL ;  Surgeon: Onita Elspeth Sharper, DO;  Location: [facility] ENDOSCOPY;  Service: Endoscopy;  Laterality: N/A;   TUBAL LIGATION      Home Medications:  Allergies as of 08/31/2024       Reactions   Codeine    Dextroamphetamine    Nsaids    Morphine  And Codeine Other (See Comments)   palpitations    Penicillins Other (See Comments)   Palpitations  Has patient had a PCN reaction causing immediate rash, facial/tongue/throat swelling, SOB or lightheadedness with hypotension: Yes Has patient had a PCN reaction causing severe rash involving mucus membranes or skin necrosis: No Has patient had a PCN reaction that required hospitalization: No Has patient had a PCN reaction occurring within the last 10 years: No If all of the above answers are NO, then may proceed with Cephalosporin use.   Tolmetin         Medication List        Accurate as of August 31, 2024  4:43 PM. If you have any questions, ask your nurse or doctor.          albuterol 108 (90 Base) MCG/ACT inhaler Commonly known as: VENTOLIN HFA Inhale 2 puffs into the lungs every 6 (six) hours as needed.   amiodarone  200 MG tablet Commonly known as: PACERONE  Take 1 tablet (  200 mg total) by mouth daily.   atorvastatin  40 MG tablet Commonly known as: LIPITOR TAKE 1 TABLET BY MOUTH DAILY   benztropine 0.5 MG tablet Commonly known as: COGENTIN Take 0.5 mg by mouth daily.   cholecalciferol  10 MCG (400 UNIT) Tabs tablet Commonly known as: VITAMIN D3 Take 400 Units by mouth daily.   divalproex  250 MG DR tablet Commonly known as: DEPAKOTE  Take 3 tablets (750 mg total) by mouth 2 (two) times daily.   docusate sodium  100 MG capsule Commonly known as: COLACE TAKE ONE CAPSULE BY MOUTH TWICE A DAY   Eliquis  5 MG Tabs tablet Generic drug: apixaban  TAKE 1 TABLET BY MOUTH TWICE A DAY   fluticasone  50 MCG/ACT nasal spray Commonly known as:  FLONASE  Place 2 sprays into the nose daily. What changed:  when to take this reasons to take this   furosemide  20 MG tablet Commonly known as: LASIX  Take 1 tablet (20 mg total) by mouth daily as needed.   metoprolol  succinate 50 MG 24 hr tablet Commonly known as: TOPROL -XL TAKE 1 TABLET BY MOUTH DAILY   OLANZapine  5 MG tablet Commonly known as: ZYPREXA  Take by mouth.   pantoprazole  20 MG tablet Commonly known as: PROTONIX  Take 20 mg by mouth daily.   solifenacin  5 MG tablet Commonly known as: VESICARE  Take 1 tablet (5 mg total) by mouth daily.   spironolactone  25 MG tablet Commonly known as: Aldactone  Take 0.5 tablets (12.5 mg total) by mouth daily.   Symbicort  80-4.5 MCG/ACT inhaler Generic drug: budesonide -formoterol  Inhale into the lungs. What changed:  how much to take when to take this reasons to take this   traZODone  50 MG tablet Commonly known as: DESYREL         Allergies:  Allergies  Allergen Reactions   Codeine    Dextroamphetamine    Nsaids    Morphine  And Codeine Other (See Comments)    palpitations    Penicillins Other (See Comments)    Palpitations  Has patient had a PCN reaction causing immediate rash, facial/tongue/throat swelling, SOB or lightheadedness with hypotension: Yes Has patient had a PCN reaction causing severe rash involving mucus membranes or skin necrosis: No Has patient had a PCN reaction that required hospitalization: No Has patient had a PCN reaction occurring within the last 10 years: No If all of the above answers are NO, then may proceed with Cephalosporin use.    Tolmetin     Family History: Family History  Problem Relation Age of Onset   Hypertension Mother    Diabetes Maternal Aunt    Diabetes Maternal Uncle    Hypertension Sister    Breast cancer Sister 55   Colon cancer Maternal Grandmother    Hypertension Brother    Depression Other    Colon polyps Other     Social History:  reports that she has  never smoked. She has never used smokeless tobacco. She reports that she does not drink alcohol and does not use drugs.  ROS: Pertinent ROS in HPI  Physical Exam: BP (!) 146/64   Pulse 80   Ht 5' 5 (1.651 m)   Wt 260 lb (117.9 kg)   BMI 43.27 kg/m   Constitutional:  Well nourished. Alert and oriented, No acute distress. HEENT: [place] AT, moist mucus membranes.  Trachea midline Cardiovascular: No clubbing, cyanosis, or edema. Respiratory: Normal respiratory effort, no increased work of breathing. GU: No CVA tenderness.  No bladder fullness or masses.  Recession of labia minora, dry,  pale vulvar vaginal mucosa and loss of mucosal ridges and folds.  Normal urethral meatus, no lesions, no prolapse, no discharge.   No urethral masses, tenderness and/or tenderness. No bladder fullness, tenderness or masses. Pale vagina mucosa, poor estrogen effect, no discharge, no lesions.  Anus and perineum are without rashes or lesions.    Neurologic: Grossly intact, no focal deficits, moving all 4 extremities. Psychiatric: Normal mood and affect.    Laboratory Data: See Epic and HPI   I have reviewed the labs.   Pertinent Imaging: N/A  In and Out Catheterization Patient is present today for a I & O catheterization due to need to get residual. Patient was cleaned and prepped in a sterile fashion with betadine . A 14 FR cath was inserted no complications were noted , 100 ml of urine return was noted, urine was yellow in color. A clean urine sample was collected for urinalysis. Bladder was drained  And catheter was removed with out difficulty.    Performed by: Carly Marks CORNWALL, PA-C and Laymon Ned, CMA    Assessment & Plan:    1. Urge incontinence - At goal on Vesicare  5 mg daily; refill sent  2. SUI - Minimal bother, managed with incontinence pads   Return in about 1 year (around 08/31/2025) for CATH UA, OAB questionnaire .  These notes generated with voice recognition software. I apologize  for typographical errors.  Carly Marks  [facility] [address]  [address] [place], [place] [address] [phone]

## 2024-08-31 ENCOUNTER — Encounter: Payer: Self-pay | Admitting: Urology

## 2024-08-31 ENCOUNTER — Ambulatory Visit (INDEPENDENT_AMBULATORY_CARE_PROVIDER_SITE_OTHER): Admitting: Urology

## 2024-08-31 VITALS — BP 146/64 | HR 80 | Ht 65.0 in | Wt 260.0 lb

## 2024-08-31 DIAGNOSIS — N3946 Mixed incontinence: Secondary | ICD-10-CM | POA: Diagnosis not present

## 2024-08-31 DIAGNOSIS — N393 Stress incontinence (female) (male): Secondary | ICD-10-CM

## 2024-08-31 DIAGNOSIS — N3941 Urge incontinence: Secondary | ICD-10-CM

## 2024-08-31 DIAGNOSIS — N958 Other specified menopausal and perimenopausal disorders: Secondary | ICD-10-CM

## 2024-08-31 MED ORDER — SOLIFENACIN SUCCINATE 5 MG PO TABS: 5.0000 mg | ORAL_TABLET | Freq: Every day | ORAL | 3 refills | Status: AC

## 2024-09-01 ENCOUNTER — Other Ambulatory Visit: Payer: Self-pay | Admitting: Cardiovascular Disease

## 2024-09-01 DIAGNOSIS — R42 Dizziness and giddiness: Secondary | ICD-10-CM

## 2024-09-01 DIAGNOSIS — I2510 Atherosclerotic heart disease of native coronary artery without angina pectoris: Secondary | ICD-10-CM

## 2024-09-01 DIAGNOSIS — I63429 Cerebral infarction due to embolism of unspecified anterior cerebral artery: Secondary | ICD-10-CM

## 2024-09-01 DIAGNOSIS — R0602 Shortness of breath: Secondary | ICD-10-CM

## 2024-09-01 DIAGNOSIS — I482 Chronic atrial fibrillation, unspecified: Secondary | ICD-10-CM

## 2024-09-01 DIAGNOSIS — I10 Essential (primary) hypertension: Secondary | ICD-10-CM

## 2024-09-01 LAB — URINALYSIS, COMPLETE
Bilirubin, UA: NEGATIVE
Glucose, UA: NEGATIVE
Ketones, UA: NEGATIVE
Leukocytes,UA: NEGATIVE
Nitrite, UA: NEGATIVE
Protein,UA: NEGATIVE
RBC, UA: NEGATIVE
Specific Gravity, UA: 1.015 (ref 1.005–1.030)
Urobilinogen, Ur: 2 mg/dL — ABNORMAL HIGH (ref 0.2–1.0)
pH, UA: 6 (ref 5.0–7.5)

## 2024-09-01 LAB — MICROSCOPIC EXAMINATION: Bacteria, UA: NONE SEEN

## 2024-09-25 ENCOUNTER — Other Ambulatory Visit: Payer: Self-pay | Admitting: Cardiovascular Disease

## 2024-09-25 DIAGNOSIS — I10 Essential (primary) hypertension: Secondary | ICD-10-CM

## 2024-09-25 DIAGNOSIS — I2510 Atherosclerotic heart disease of native coronary artery without angina pectoris: Secondary | ICD-10-CM

## 2024-09-26 ENCOUNTER — Other Ambulatory Visit: Payer: Self-pay | Admitting: Cardiology

## 2024-09-26 DIAGNOSIS — R42 Dizziness and giddiness: Secondary | ICD-10-CM

## 2024-09-26 DIAGNOSIS — I482 Chronic atrial fibrillation, unspecified: Secondary | ICD-10-CM

## 2024-09-26 DIAGNOSIS — I5033 Acute on chronic diastolic (congestive) heart failure: Secondary | ICD-10-CM

## 2024-09-26 DIAGNOSIS — I10 Essential (primary) hypertension: Secondary | ICD-10-CM

## 2024-09-26 DIAGNOSIS — R0602 Shortness of breath: Secondary | ICD-10-CM

## 2024-09-26 DIAGNOSIS — I2510 Atherosclerotic heart disease of native coronary artery without angina pectoris: Secondary | ICD-10-CM

## 2024-10-17 ENCOUNTER — Ambulatory Visit: Admitting: Cardiovascular Disease

## 2024-10-20 ENCOUNTER — Ambulatory Visit: Admitting: Cardiovascular Disease

## 2024-10-23 ENCOUNTER — Encounter: Payer: Self-pay | Admitting: Cardiovascular Disease

## 2024-10-23 ENCOUNTER — Ambulatory Visit: Admitting: Cardiovascular Disease

## 2024-10-23 VITALS — BP 124/83 | HR 70 | Ht 65.0 in | Wt 254.6 lb

## 2024-10-23 DIAGNOSIS — I482 Chronic atrial fibrillation, unspecified: Secondary | ICD-10-CM

## 2024-10-23 DIAGNOSIS — I340 Nonrheumatic mitral (valve) insufficiency: Secondary | ICD-10-CM

## 2024-10-23 DIAGNOSIS — I5033 Acute on chronic diastolic (congestive) heart failure: Secondary | ICD-10-CM

## 2024-10-23 DIAGNOSIS — I2510 Atherosclerotic heart disease of native coronary artery without angina pectoris: Secondary | ICD-10-CM

## 2024-10-23 DIAGNOSIS — R0602 Shortness of breath: Secondary | ICD-10-CM

## 2024-10-23 DIAGNOSIS — I10 Essential (primary) hypertension: Secondary | ICD-10-CM

## 2024-10-23 NOTE — Progress Notes (Signed)
 Cardiology Office Note   Date:  10/23/2024   ID:  Carly, Marks Mar 07, 1956, MRN [id number]  PCP:  Lorel Maxie DELENA, MD  Cardiologist:  Denyse Bathe, MD      History of Present Illness: Carly Marks is a 68 y.o. female who presents for  Chief Complaint  Patient presents with   Follow-up    3 month follow up    Have a cold.      Past Medical History:  Diagnosis Date   Anemia    Anxiety    Arthritis    Asthma    CAD (coronary artery disease)    per patient   Depression    Family history of colonic polyps    GERD (gastroesophageal reflux disease)    HA (headache)    HTN (hypertension)    Major depressive disorder, recurrent episode, moderate (HCC)    Obstructive sleep apnea      Past Surgical History:  Procedure Laterality Date   CARDIAC CATHETERIZATION  02/19/15, 06/2009, 08/2011   COLONOSCOPY N/A 04/22/2015   Procedure: COLONOSCOPY;  Surgeon: Deward CINDERELLA Piedmont, MD;  Location: [facility] ENDOSCOPY;  Service: Gastroenterology;  Laterality: N/A;   COLONOSCOPY WITH PROPOFOL  N/A 09/26/2021   Procedure: COLONOSCOPY WITH PROPOFOL ;  Surgeon: Onita Elspeth Sharper, DO;  Location[facility] ENDOSCOPY;  Service: Endoscopy;  Laterality: N/A;   TUBAL LIGATION       Current Outpatient Medications  Medication Sig Dispense Refill   albuterol (VENTOLIN HFA) 108 (90 Base) MCG/ACT inhaler Inhale 2 puffs into the lungs every 6 (six) hours as needed.     amiodarone  (PACERONE ) 200 MG tablet TAKE 1 TABLET BY MOUTH TWICE A DAY 60 tablet 2   atorvastatin  (LIPITOR) 40 MG tablet TAKE 1 TABLET BY MOUTH DAILY 30 tablet 3   benztropine (COGENTIN) 0.5 MG tablet Take 0.5 mg by mouth daily.     cholecalciferol  (VITAMIN D) 400 UNITS TABS tablet Take 400 Units by mouth daily.      divalproex  (DEPAKOTE ) 250 MG DR tablet Take 3 tablets (750 mg total) by mouth 2 (two) times daily. 100 tablet 0   docusate sodium  (COLACE) 100 MG capsule TAKE ONE CAPSULE BY MOUTH TWICE A DAY 100 capsule 1   ELIQUIS  5 MG  TABS tablet TAKE 1 TABLET BY MOUTH TWICE A DAY 60 tablet 2   fluticasone  (FLONASE ) 50 MCG/ACT nasal spray Place 2 sprays into the nose daily. (Patient taking differently: Place 2 sprays into the nose as needed for allergies.)     furosemide  (LASIX ) 20 MG tablet TAKE 1 TABLET EVERY DAY AS NEEDED 30 tablet 0   metoprolol  succinate (TOPROL -XL) 50 MG 24 hr tablet TAKE 1 TABLET BY MOUTH DAILY 30 tablet 2   OLANZapine  (ZYPREXA ) 5 MG tablet Take by mouth.     pantoprazole  (PROTONIX ) 20 MG tablet Take 20 mg by mouth daily.     solifenacin  (VESICARE ) 5 MG tablet Take 1 tablet (5 mg total) by mouth daily. 90 tablet 3   spironolactone  (ALDACTONE ) 25 MG tablet Take 0.5 tablets (12.5 mg total) by mouth daily. 30 tablet 11   SYMBICORT  80-4.5 MCG/ACT inhaler Inhale into the lungs. (Patient taking differently: Inhale 2 puffs into the lungs as needed.)     traZODone  (DESYREL ) 50 MG tablet      No current facility-administered medications for this visit.    Allergies:   Codeine, Dextroamphetamine, Nsaids, Morphine  and codeine, Penicillins, and Tolmetin    Social History:   reports that  she has never smoked. She has never used smokeless tobacco. She reports that she does not drink alcohol and does not use drugs.   Family History:  family history includes Breast cancer (age of onset: 42) in her sister; Colon cancer in her maternal grandmother; Colon polyps in an other family member; Depression in an other family member; Diabetes in her maternal aunt and maternal uncle; Hypertension in her brother, mother, and sister.    ROS:     Review of Systems  Constitutional: Negative.   HENT: Negative.    Eyes: Negative.   Respiratory: Negative.    Gastrointestinal: Negative.   Genitourinary: Negative.   Musculoskeletal: Negative.   Skin: Negative.   Neurological: Negative.   Endo/Heme/Allergies: Negative.   Psychiatric/Behavioral: Negative.    All other systems reviewed and are negative.     All other  systems are reviewed and negative.    PHYSICAL EXAM: VS:  BP 124/83   Pulse 70   Ht 5' 5 (1.651 m)   Wt 254 lb 9.6 oz (115.5 kg)   SpO2 97%   BMI 42.37 kg/m  , BMI Body mass index is 42.37 kg/m. Last weight:  Wt Readings from Last 3 Encounters:  10/23/24 254 lb 9.6 oz (115.5 kg)  08/31/24 260 lb (117.9 kg)  07/20/24 245 lb (111.1 kg)     Physical Exam Constitutional:      Appearance: Normal appearance.  Cardiovascular:     Rate and Rhythm: Normal rate and regular rhythm.     Heart sounds: Normal heart sounds.  Pulmonary:     Effort: Pulmonary effort is normal.     Breath sounds: Normal breath sounds.  Musculoskeletal:     Right lower leg: No edema.     Left lower leg: No edema.  Neurological:     Mental Status: She is alert.       EKG:   Recent Labs: 08/11/2024: ALT 16; BUN 12; Creatinine, Ser 1.29; Hemoglobin 11.5; Platelets 146; Potassium 4.2; Sodium 142    Lipid Panel    Component Value Date/Time   CHOL 108 06/25/2018 0442   CHOL 171 02/04/2015 0251   TRIG 27 06/25/2018 0442   TRIG 41 02/04/2015 0251   HDL 41 06/25/2018 0442   HDL 63 02/04/2015 0251   CHOLHDL 2.6 06/25/2018 0442   VLDL 5 06/25/2018 0442   VLDL 8 02/04/2015 0251   LDLCALC 62 06/25/2018 0442   LDLCALC 100 (H) 02/04/2015 0251      Other studies Reviewed: Additional studies/ records that were reviewed today include:  Review of the above records demonstrates:       No data to display            ASSESSMENT AND PLAN:    ICD-10-CM   1. Nonrheumatic mitral valve regurgitation  I34.0     2. SOB (shortness of breath)  R06.02    continue GDMT    3. CHF (congestive heart failure), NYHA class III, acute on chronic, diastolic (HCC)  I50.33     4. Primary hypertension  I10     5. Chronic atrial fibrillation (HCC)  I48.20     6. Coronary artery disease involving native coronary artery of native heart without angina pectoris  I25.10        Problem List Items Addressed This  Visit       Cardiovascular and Mediastinum   CAD (coronary artery disease)   HTN (hypertension)   Chronic atrial fibrillation (HCC)     Other  SOB (shortness of breath)   Other Visit Diagnoses       Nonrheumatic mitral valve regurgitation    -  Primary     CHF (congestive heart failure), NYHA class III, acute on chronic, diastolic (HCC)              Disposition:   Return in about 2 months (around 12/24/2024).    Total time spent: 35 minutes  Signed,  Denyse Bathe, MD  10/23/2024 3:00 PM    [facility]

## 2024-12-15 ENCOUNTER — Other Ambulatory Visit: Payer: Self-pay | Admitting: Cardiovascular Disease

## 2024-12-15 DIAGNOSIS — I639 Cerebral infarction, unspecified: Secondary | ICD-10-CM

## 2024-12-25 ENCOUNTER — Ambulatory Visit: Admitting: Cardiovascular Disease

## 2025-08-30 ENCOUNTER — Ambulatory Visit: Admitting: Urology
